# Patient Record
Sex: Male | Born: 1955 | Race: White | Hispanic: No | Marital: Married | Smoking: Current every day smoker
Health system: Southern US, Community
[De-identification: ages and names within clinical notes are randomized; demographics above are authoritative.]

## PROBLEM LIST (undated history)

## (undated) DIAGNOSIS — F1010 Alcohol abuse, uncomplicated: Secondary | ICD-10-CM

## (undated) DIAGNOSIS — D689 Coagulation defect, unspecified: Secondary | ICD-10-CM

## (undated) DIAGNOSIS — N189 Chronic kidney disease, unspecified: Secondary | ICD-10-CM

## (undated) DIAGNOSIS — M1990 Unspecified osteoarthritis, unspecified site: Secondary | ICD-10-CM

## (undated) DIAGNOSIS — Z87898 Personal history of other specified conditions: Secondary | ICD-10-CM

## (undated) DIAGNOSIS — I10 Essential (primary) hypertension: Secondary | ICD-10-CM

## (undated) DIAGNOSIS — R310 Gross hematuria: Secondary | ICD-10-CM

## (undated) DIAGNOSIS — C649 Malignant neoplasm of unspecified kidney, except renal pelvis: Secondary | ICD-10-CM

## (undated) DIAGNOSIS — N2889 Other specified disorders of kidney and ureter: Secondary | ICD-10-CM

## (undated) HISTORY — DX: Malignant neoplasm of unspecified kidney, except renal pelvis: C64.9

## (undated) HISTORY — DX: Essential (primary) hypertension: I10

## (undated) HISTORY — DX: Personal history of other specified conditions: Z87.898

## (undated) HISTORY — DX: Other specified disorders of kidney and ureter: N28.89

## (undated) HISTORY — DX: Chronic kidney disease, unspecified: N18.9

## (undated) HISTORY — PX: OTHER SURGICAL HISTORY: SHX169

## (undated) HISTORY — DX: Unspecified osteoarthritis, unspecified site: M19.90

## (undated) HISTORY — DX: Gross hematuria: R31.0

## (undated) HISTORY — DX: Alcohol abuse, uncomplicated: F10.10

## (undated) HISTORY — DX: Coagulation defect, unspecified: D68.9

---

## 2013-10-18 ENCOUNTER — Emergency Department: Payer: Self-pay | Admitting: Student

## 2013-10-18 LAB — CBC WITH DIFFERENTIAL/PLATELET
BASOS ABS: 0.1 10*3/uL (ref 0.0–0.1)
Basophil %: 1.1 %
Eosinophil #: 0.2 10*3/uL (ref 0.0–0.7)
Eosinophil %: 1.2 %
HCT: 46.3 % (ref 40.0–52.0)
HGB: 14.7 g/dL (ref 13.0–18.0)
LYMPHS PCT: 22.6 %
Lymphocyte #: 2.9 10*3/uL (ref 1.0–3.6)
MCH: 30 pg (ref 26.0–34.0)
MCHC: 31.8 g/dL — ABNORMAL LOW (ref 32.0–36.0)
MCV: 94 fL (ref 80–100)
MONO ABS: 1.3 x10 3/mm — AB (ref 0.2–1.0)
Monocyte %: 10.2 %
Neutrophil #: 8.4 10*3/uL — ABNORMAL HIGH (ref 1.4–6.5)
Neutrophil %: 64.9 %
PLATELETS: 250 10*3/uL (ref 150–440)
RBC: 4.9 10*6/uL (ref 4.40–5.90)
RDW: 15.1 % — AB (ref 11.5–14.5)
WBC: 12.9 10*3/uL — ABNORMAL HIGH (ref 3.8–10.6)

## 2013-10-18 LAB — TROPONIN I: Troponin-I: <0.02 ng/mL

## 2013-10-18 LAB — COMPREHENSIVE METABOLIC PANEL
ALT: 37 U/L
Albumin: 3.5 g/dL (ref 3.4–5.0)
Alkaline Phosphatase: 78 U/L
Anion Gap: 11 (ref 7–16)
BILIRUBIN TOTAL: 0.3 mg/dL (ref 0.2–1.0)
BUN: 15 mg/dL (ref 7–18)
CHLORIDE: 101 mmol/L (ref 98–107)
Calcium, Total: 8.6 mg/dL (ref 8.5–10.1)
Co2: 29 mmol/L (ref 21–32)
Creatinine: 0.89 mg/dL (ref 0.60–1.30)
EGFR (African American): >60
EGFR (Non-African Amer.): >60
Glucose: 85 mg/dL (ref 65–99)
OSMOLALITY: 281 (ref 275–301)
Potassium: 2.8 mmol/L — ABNORMAL LOW (ref 3.5–5.1)
SGOT(AST): 27 U/L (ref 15–37)
Sodium: 141 mmol/L (ref 136–145)
Total Protein: 7 g/dL (ref 6.4–8.2)

## 2013-10-18 LAB — MAGNESIUM: MAGNESIUM: 1.9 mg/dL

## 2013-10-18 LAB — ETHANOL: Ethanol: 33 mg/dL (ref 0–80)

## 2013-10-19 ENCOUNTER — Emergency Department: Payer: Self-pay | Admitting: Emergency Medicine

## 2013-10-19 LAB — COMPREHENSIVE METABOLIC PANEL
ALBUMIN: 3.7 g/dL (ref 3.4–5.0)
ALT: 41 U/L
Alkaline Phosphatase: 84 U/L
Anion Gap: 6 — ABNORMAL LOW (ref 7–16)
BUN: 13 mg/dL (ref 7–18)
Bilirubin,Total: 0.5 mg/dL (ref 0.2–1.0)
CALCIUM: 8.8 mg/dL (ref 8.5–10.1)
CREATININE: 0.84 mg/dL (ref 0.60–1.30)
Chloride: 104 mmol/L (ref 98–107)
Co2: 32 mmol/L (ref 21–32)
EGFR (Non-African Amer.): >60
Glucose: 102 mg/dL — ABNORMAL HIGH (ref 65–99)
Osmolality: 283 (ref 275–301)
POTASSIUM: 3.6 mmol/L (ref 3.5–5.1)
SGOT(AST): 28 U/L (ref 15–37)
Sodium: 142 mmol/L (ref 136–145)
TOTAL PROTEIN: 7.8 g/dL (ref 6.4–8.2)

## 2013-10-19 LAB — URINALYSIS, COMPLETE
Bacteria: NONE SEEN
RBC,UR: 5890 /HPF (ref 0–5)
Specific Gravity: 1.011 (ref 1.003–1.030)
Squamous Epithelial: NONE SEEN
WBC UR: 27 /HPF (ref 0–5)

## 2013-10-19 LAB — CBC
HCT: 50.5 % (ref 40.0–52.0)
HGB: 16.8 g/dL (ref 13.0–18.0)
MCH: 30.8 pg (ref 26.0–34.0)
MCHC: 33.2 g/dL (ref 32.0–36.0)
MCV: 93 fL (ref 80–100)
PLATELETS: 240 10*3/uL (ref 150–440)
RBC: 5.44 10*6/uL (ref 4.40–5.90)
RDW: 14.8 % — ABNORMAL HIGH (ref 11.5–14.5)
WBC: 13.5 10*3/uL — ABNORMAL HIGH (ref 3.8–10.6)

## 2013-10-19 LAB — PROTIME-INR
INR: 0.9
Prothrombin Time: 12 secs (ref 11.5–14.7)

## 2013-10-21 LAB — URINE CULTURE

## 2013-10-30 ENCOUNTER — Ambulatory Visit: Payer: Self-pay | Admitting: Urology

## 2013-10-31 ENCOUNTER — Ambulatory Visit: Payer: Self-pay | Admitting: Urology

## 2013-11-03 DIAGNOSIS — C649 Malignant neoplasm of unspecified kidney, except renal pelvis: Secondary | ICD-10-CM

## 2013-11-03 HISTORY — DX: Malignant neoplasm of unspecified kidney, except renal pelvis: C64.9

## 2013-11-03 HISTORY — PX: NEPHRECTOMY RADICAL: SUR878

## 2013-11-03 LAB — HM HIV SCREENING LAB: HM HIV SCREENING: NEGATIVE

## 2013-11-03 LAB — HM HEPATITIS C SCREENING LAB: HM Hepatitis Screen: NEGATIVE

## 2013-11-07 ENCOUNTER — Ambulatory Visit: Payer: Self-pay | Admitting: Urology

## 2013-11-07 LAB — URINALYSIS, COMPLETE
BILIRUBIN, UR: NEGATIVE
Bacteria: NONE SEEN
GLUCOSE, UR: NEGATIVE mg/dL (ref 0–75)
Ketone: NEGATIVE
LEUKOCYTE ESTERASE: NEGATIVE
Nitrite: NEGATIVE
Ph: 7 (ref 4.5–8.0)
Protein: NEGATIVE
RBC,UR: 1 /HPF (ref 0–5)
SPECIFIC GRAVITY: 1.005 (ref 1.003–1.030)
SQUAMOUS EPITHELIAL: NONE SEEN
WBC UR: 5 /HPF (ref 0–5)

## 2013-11-07 LAB — PROTIME-INR
INR: 0.9
PROTHROMBIN TIME: 12.2 s (ref 11.5–14.7)

## 2013-11-08 LAB — URINE CULTURE

## 2013-11-11 ENCOUNTER — Inpatient Hospital Stay: Payer: Self-pay | Admitting: Urology

## 2013-11-11 LAB — CBC WITH DIFFERENTIAL/PLATELET
BASOS ABS: 0.1 10*3/uL (ref 0.0–0.1)
BASOS PCT: 0.4 %
EOS PCT: 0 %
Eosinophil #: 0 10*3/uL (ref 0.0–0.7)
HCT: 41.6 % (ref 40.0–52.0)
HGB: 13.8 g/dL (ref 13.0–18.0)
LYMPHS PCT: 3.6 %
Lymphocyte #: 0.8 10*3/uL — ABNORMAL LOW (ref 1.0–3.6)
MCH: 30.9 pg (ref 26.0–34.0)
MCHC: 33.2 g/dL (ref 32.0–36.0)
MCV: 93 fL (ref 80–100)
MONO ABS: 1 x10 3/mm (ref 0.2–1.0)
Monocyte %: 4.3 %
NEUTROS PCT: 91.7 %
Neutrophil #: 21.1 10*3/uL — ABNORMAL HIGH (ref 1.4–6.5)
Platelet: 216 10*3/uL (ref 150–440)
RBC: 4.47 10*6/uL (ref 4.40–5.90)
RDW: 14.4 % (ref 11.5–14.5)
WBC: 23 10*3/uL — ABNORMAL HIGH (ref 3.8–10.6)

## 2013-11-11 LAB — COMPREHENSIVE METABOLIC PANEL
ALBUMIN: 3.2 g/dL — AB (ref 3.4–5.0)
Alkaline Phosphatase: 71 U/L
Anion Gap: 9 (ref 7–16)
BUN: 13 mg/dL (ref 7–18)
Bilirubin,Total: 0.5 mg/dL (ref 0.2–1.0)
CALCIUM: 7.9 mg/dL — AB (ref 8.5–10.1)
CHLORIDE: 104 mmol/L (ref 98–107)
CO2: 27 mmol/L (ref 21–32)
CREATININE: 1.02 mg/dL (ref 0.60–1.30)
Glucose: 110 mg/dL — ABNORMAL HIGH (ref 65–99)
OSMOLALITY: 280 (ref 275–301)
POTASSIUM: 3.3 mmol/L — AB (ref 3.5–5.1)
SGOT(AST): 28 U/L (ref 15–37)
SGPT (ALT): 35 U/L
Sodium: 140 mmol/L (ref 136–145)
Total Protein: 6 g/dL — ABNORMAL LOW (ref 6.4–8.2)

## 2013-11-11 LAB — MAGNESIUM: MAGNESIUM: 1.4 mg/dL — AB

## 2013-11-12 LAB — CBC WITH DIFFERENTIAL/PLATELET
Basophil #: 0 10*3/uL (ref 0.0–0.1)
Basophil %: 0.2 %
Eosinophil #: 0 10*3/uL (ref 0.0–0.7)
Eosinophil %: 0.3 %
HCT: 39.4 % — ABNORMAL LOW (ref 40.0–52.0)
HGB: 12.9 g/dL — AB (ref 13.0–18.0)
LYMPHS PCT: 13.1 %
Lymphocyte #: 1.6 10*3/uL (ref 1.0–3.6)
MCH: 30.6 pg (ref 26.0–34.0)
MCHC: 32.8 g/dL (ref 32.0–36.0)
MCV: 94 fL (ref 80–100)
MONO ABS: 1.1 x10 3/mm — AB (ref 0.2–1.0)
MONOS PCT: 8.8 %
NEUTROS ABS: 9.6 10*3/uL — AB (ref 1.4–6.5)
Neutrophil %: 77.6 %
Platelet: 190 10*3/uL (ref 150–440)
RBC: 4.22 10*6/uL — AB (ref 4.40–5.90)
RDW: 14.6 % — AB (ref 11.5–14.5)
WBC: 12.4 10*3/uL — AB (ref 3.8–10.6)

## 2013-11-12 LAB — BASIC METABOLIC PANEL
ANION GAP: 7 (ref 7–16)
BUN: 14 mg/dL (ref 7–18)
CO2: 31 mmol/L (ref 21–32)
Calcium, Total: 7.8 mg/dL — ABNORMAL LOW (ref 8.5–10.1)
Chloride: 102 mmol/L (ref 98–107)
Creatinine: 1.29 mg/dL (ref 0.60–1.30)
EGFR (African American): >60
EGFR (Non-African Amer.): >60
Glucose: 86 mg/dL (ref 65–99)
Osmolality: 279 (ref 275–301)
Potassium: 3.4 mmol/L — ABNORMAL LOW (ref 3.5–5.1)
Sodium: 140 mmol/L (ref 136–145)

## 2013-11-12 LAB — POTASSIUM: POTASSIUM: 3.7 mmol/L (ref 3.5–5.1)

## 2013-11-12 LAB — MAGNESIUM
MAGNESIUM: 2.3 mg/dL
Magnesium: 1.6 mg/dL — ABNORMAL LOW

## 2013-11-13 LAB — CBC WITH DIFFERENTIAL/PLATELET
Basophil #: 0 10*3/uL (ref 0.0–0.1)
Basophil %: 0.1 %
Eosinophil #: 0 10*3/uL (ref 0.0–0.7)
Eosinophil %: 0.3 %
HCT: 40.5 % (ref 40.0–52.0)
HGB: 13.5 g/dL (ref 13.0–18.0)
LYMPHS ABS: 0.9 10*3/uL — AB (ref 1.0–3.6)
LYMPHS PCT: 8.5 %
MCH: 31.2 pg (ref 26.0–34.0)
MCHC: 33.4 g/dL (ref 32.0–36.0)
MCV: 93 fL (ref 80–100)
MONO ABS: 1 x10 3/mm (ref 0.2–1.0)
Monocyte %: 9.8 %
Neutrophil #: 8.4 10*3/uL — ABNORMAL HIGH (ref 1.4–6.5)
Neutrophil %: 81.3 %
PLATELETS: 185 10*3/uL (ref 150–440)
RBC: 4.33 10*6/uL — ABNORMAL LOW (ref 4.40–5.90)
RDW: 14.4 % (ref 11.5–14.5)
WBC: 10.4 10*3/uL (ref 3.8–10.6)

## 2013-11-13 LAB — BASIC METABOLIC PANEL
ANION GAP: 6 — AB (ref 7–16)
BUN: 9 mg/dL (ref 7–18)
CALCIUM: 7.9 mg/dL — AB (ref 8.5–10.1)
CREATININE: 0.99 mg/dL (ref 0.60–1.30)
Chloride: 104 mmol/L (ref 98–107)
Co2: 30 mmol/L (ref 21–32)
EGFR (Non-African Amer.): >60
GLUCOSE: 90 mg/dL (ref 65–99)
Osmolality: 278 (ref 275–301)
Potassium: 3.6 mmol/L (ref 3.5–5.1)
Sodium: 140 mmol/L (ref 136–145)

## 2014-04-26 NOTE — Consult Note (Signed)
Brief Consult Note: Diagnosis: acute hyeprcarbic respiratory failrue with respiratory acidosis, Tobacco absue, ETOH abuse.   Patient was seen by consultant.   Consult note dictated.   Recommend further assessment or treatment.   Orders entered.   Comments: 1. acute hypercarbic respiratory failure, likely due to underlying COPD, start BIPAP, duonebs, advair, tiotropium,. O2 prn, get chest xray, following 2. Tobacco abuse, d.w pr for 3 min, nicotine replacement will be ordered 3. ETOH abuse, will initiate CIWA scale when needed, get LFT''s 4.s/p nephrectomy, pain meds per urology, getting labs, following for postopeative anemia Thanks for consult, will follow.  Electronic Signatures: Theodoro Grist (MD)  (Signed [phone] 12:57)  Authored: Brief Consult Note   Last Updated: 09-Nov-15 12:57 by Theodoro Grist (MD)

## 2014-04-26 NOTE — Discharge Summary (Signed)
Dates of Admission and Diagnosis:  Date of Admission 11-Nov-2013   Date of Discharge 15-Nov-2013   Admitting Diagnosis Renal mass   Final Diagnosis Renal cell carcinoma, pT3a   Discharge Diagnosis 1 Cellulitis   2 Alcoholism    Chief Complaint/History of Present Illness See admission H&P   Allergies:  No Known Allergies:   Pathology:  09-Nov-15 00:00   Pathology Report CASE: [id number] PATIENT: Kenneth Maxwell Surgical Pathology Report      SPECIMEN SUBMITTED: A. Kidney, right, nephrectomy, with adrenal   CLINICAL HISTORY: None provided   PRE-OPERATIVE DIAGNOSIS: right renal mass   POST-OPERATIVE DIAGNOSIS: Same as pre-op/ right nephrectomy      DIAGNOSIS: A. RIGHT KIDNEY AND ADRENAL GLAND; RIGHT OPEN RADICAL NEPHRECTOMY: ???CLEAR CELL RENAL CELL CARCINOMA. ???FUHRMAN GRADE 2. ???ADRENAL NODULAR HYPERPLASIA. ???SEE SUMMARY BELOW.   KIDNEY: Nephrectomy, Partial or Radical Specimens InvolvedA: Kidney, right, nephrectomy, with adrenal  Kidney, Nephrectomy, Partial or Radical Cancer Case Summary Specimen Site: Kidney structure SPECIMEN Procedure:     Radical nephrectomy Specimen Laterality:     Right Tumor Focality:     Unifocal Macroscopic Extent of Tumor:  Tumor extension into renal sinus TUMOR Histologic Type:    Clear cell renal cell carcinoma Sarcomatoid Features:    Not identified Histologic Grade (Fuhrman NuclearGrade): G2: Nuclei slightly irregular, approximately 15 microns; nucleoli evident EXTENT Tumor Size:    Greatest dimension (cm) 4.3cm Microscopic Tumor Extension:  Tumor extension into renal sinus MARGINS Margin Status: Margins uninvolved by invasive carcinoma ACCESSORY FINDINGS Lymph-Vascular Invasion: Not identified STAGE  (pTNM) TNM Descriptors:    Not applicable Primary Tumor (pT): pT3a: Tumor grossly extends into the renal vein or its segmental (muscle containing) branches, ortumor invades perirenal and / or renal  sinus fat but not beyond Gerota's fascia Regional Lymph Nodes (pN) pNX: Regional lymph nodes cannot be assessed No nodes submitted or found Distant Metastasis (pM): Not applicable Not applicable ADDITIONAL NON-TUMOR Pathologic Findings in Nonneoplastic Kidney: Other (specify) PAS-D stain pending on non-neoplastic kidney to evaluate for glomerulosclerosis.  Note Intradepartmental consultation was obtained.    GROSS DESCRIPTION:   A.Labeled: Right kidney with adrenal  Type of specimen: Nephrectomy  Laterality of specimen: Right kidney and adrenal  Size and weight of specimen: 575 grams 15.7 x 12.2 x 5.7 cm  Orientation: External surface which includes Gerota's fascia inked blue  Presence/absence of adrenal gland: Present  Tumor site: anatomic site: Mid to upper pole adjacent to renal sinus  Tumor size: 4.3 x 4.0 x 3.6 cm  Tumor Description: Variegated red tan-yellow focally cystic  Presence/absence of multicentricity: Absent  Confinement/non-confinement to kidney: Bulging the capsule and abutting Gerota's fascia however appears contained  Extent of invasion: Perirenal adipose tissue: Grossly uninvolved Gerota's fascia: Grossly abuts Renal vein: Does notgrossly invade, 2.5 cm from margin Ureter: Does not grossly involve, 8.0 cm from ureter margin Renal Sinus: Grossly involves renal sinus      Pelvicalyceal: Does not grossly involve Adrenal: Not continuous with however the adrenal 4.6 x 2.2 x 2.8cm and has a more central defined nodule 2.0 x 2.0 x 1.5 centimeter color is grossly consistent with adjacent adrenal tissue  Other organs: None noted  Surgical margins:      Peri-renal adipose tissue: Negative Renal vein: Negative Renal artery: Negative Ureter: Negative  Description of kidney away from tumor: Cortex 1.0 cm on medulla 1.6 cm  Hilar lymph nodes: None grossly identified  Other significant findings: None noted  Block Summary: 1???en face vein and  artery  and ureter margin 2???3???Gerota's fascia and tumor 4???6???tumor with renal sinus 7???tumor in area of hilum with adjacent fatty tissue 8???tumor and adjacent uninvolved kidney 9???kidney from lower pole 10???11-representative adrenal with nodule  Final Diagnosis performed by Delorse Lek, MD.  Electronically signed 11/14/2013 2:45:32PM    The electronic signature indicates that the named Attending Pathologist has evaluated the specimen  Technical component performed at [facility], [address], [place], [place] [address] Lab: [phone] Dir: Darrick Penna. Evette Doffing, MD  Professional component performed at [facility], [facility], [place], [place], [place] [address] Lab: [phone] Dir: Dellia Nims. Rubinas, MD   Routine Chem:  11-Nov-15 04:22   Glucose, Serum 90  BUN 9  Creatinine (comp) 0.99  Sodium, Serum 140  Potassium, Serum 3.6  Chloride, Serum 104  CO2, Serum 30  Calcium (Total), Serum  7.9  Anion Gap  6  Osmolality (calc) 278  eGFR (African American) >60  eGFR (Non-African American) >60 (eGFR values <41m/min/1.73 m2 may be an indication of chronic kidney disease (CKD). Calculated eGFR, using the MRDR Study equation, is useful in  patients with stable renal function. The eGFR calculation will not be reliable in acutely ill patients when serum creatinine is changing rapidly. It is not useful in patients on dialysis. The eGFR calculation may not be applicable to patients at the low and high extremes of body sizes, pregnant women, and vegetarians.)  Routine Hem:  11-Nov-15 04:22   WBC (CBC) 10.4  RBC (CBC)  4.33  Hemoglobin (CBC) 13.5  Hematocrit (CBC) 40.5  Platelet Count (CBC) 185  MCV 93  MCH 31.2  MCHC 33.4  RDW 14.4  Neutrophil % 81.3  Lymphocyte % 8.5  Monocyte % 9.8  Eosinophil % 0.3  Basophil % 0.1  Neutrophil #  8.4  Lymphocyte #  0.9  Monocyte # 1.0  Eosinophil # 0.0  Basophil # 0.0 (Result(s) reported on 13 Nov 2013  at 04:45AM.)   Pertinent Past History:  Pertinent Past History See admission H&P   Hospital Course:  Hospital Course Patient was admitted from surgery to ICU for overnight monitoring given concern for ventilation/ oxygenation issues immediately post op.  He improved and was able to be weaned from oxygen overnight with pulmonary toilet. He remained on CIWA protocol throughout his hospital course with no signs of EtOH withdrawal.  His labs and vitals remained stable.  He was transfered to the floor on POD1.  His diet was advanced as tolerated and began passing flatus on POD3.  He was transitioned to PO pain medications.  He was treated for an evolving mild right cellulitis overlying the right hip but not involving the wound.  He was discharged home in stable condition on POD 4, tolerating PO, pain well controlled, ambulating.   Condition on Discharge Good   Code Status:  Code Status Full Code   DISCHARGE INSTRUCTIONS HOME MEDS:  Medication Reconciliation: Patient's Home Medications at Discharge:     Medication Instructions  acetaminophen-oxycodone 325 mg-5 mg oral tablet  1 tab(s) orally every 4 hours, As Needed, moderate pain (4-6/10) - for Pain , As needed, moderate pain (4-6/10)   docusate sodium 100 mg oral capsule  1 cap(s) orally 2 times a day   cephalexin 500 mg oral capsule  1 cap(s) orally every 8 hours    PRESCRIPTIONS: PRINTED AND PLACED ON CHART   Physician's Instructions:  Home Health? No   Treatments None   Dressing Care Staples are in place to close your incision.  Keep this area clean and dry.  Keep the bandage in place unless it becomes loose or soiled.  You may shower   Home Oxygen? No   Diet Regular   Dietary Supplements None   Diet Consistency Regular Consistency   Activity Limitations No exertional activity  No heavy lifting  4-6 weeks post op   Referrals None   Return to Work after follow up visit with MD   Time frame for Follow Up Appointment 1-2  weeks  stable removal     Hollice Espy J(Attending Physician): [facility], [address], [place], [place], [place] [address], [place] [phone]  TIME SPENT:  Total Time: 30 minutes or less   Electronic Signatures: Sherlynn Stalls (MD)  (Signed [phone] 07:35)  Authored: ADMISSION DATE AND DIAGNOSIS, CHIEF COMPLAINT/HPI, Allergies, PERTINENT LABS, PERTINENT PAST HISTORY, HOSPITAL COURSE, DISCHARGE INSTRUCTIONS HOME MEDS, PATIENT INSTRUCTIONS, Follow Up Physician, TIME SPENT   Last Updated: 13-Nov-15 07:35 by Sherlynn Stalls (MD)

## 2014-04-26 NOTE — Consult Note (Signed)
PATIENT NAME:  Kenneth Maxwell, TETRAULT MR#:  [id number] DATE OF BIRTH:  05/27/1955  DATE OF CONSULTATION:  11/11/2013  REFERRING PHYSICIAN:   CONSULTING PHYSICIAN:  Theodoro Grist, MD  PRIMARY CARE PHYSICIAN: Lelon Huh, MD  HISTORY OF PRESENT ILLNESS: The patient is a 59 year old Caucasian male with past medical history significant for history of hypertension who presents for nephrectomy as well as right adrenalectomy on the 9th of November 2015. He underwent right open radical nephrectomy, nonadrenal sparing,  and lost approximately 150 mL of blood during operation. Operation was performed under general epidural anesthesia. The patient is receiving epidural fentanyl now; however, complains of significant pain in the right flank area were operation was performed. Postoperatively, he was somewhat short of breath. He did not take deep breaths overall. He was placed on oxygen and his ABGs were performed since he looked dusky. His ABGs revealed hypoxia as well as hypercarbia and acidosis. Hospitalist services were contacted for consultation. The patient admits smoking approximately 2 packs a day for at least 40 years or more. He also admits of drinking plenty of alcohol.  PAST MEDICAL HISTORY: Significant for history of tobacco and alcohol abuse, history of hypertension.   MEDICATIONS: None.   PAST SURGICAL HISTORY: Open radical nephrectomy as well as adrenalectomy today, on the 9th of November 2015, by Dr. Erlene Quan.   ALLERGIES: None.   FAMILY HISTORY: Negative for early coronary artery disease. The patient's mother had kidney problems. The patient's father had heart problems, according to the patient.  SOCIAL HISTORY: The patient is married, has 3 children. Smokes approximately 2 packs a day for more than 40 years. Drinks at least 10 to 15 beers a day. He works as a Building control surveyor.   REVIEW OF SYSTEMS: Difficult to obtain as the patient is poorly cooperative. Admits of having significant pain in the left flank.  Denies any chest pains. Denies any significant shortness of breath. Denies any cough production. Denies nausea, vomiting, or abdominal pains, except as mentioned above.  PHYSICAL EXAMINATION: VITAL SIGNS: During my evaluation, the patient's temperature was unknown, the patient's pulse is 64, respiration rate was 12 to 14, blood pressure 130/60's, and O2 sats were 97% on oxygen therapy.  GENERAL: This is a well-developed, well-nourished pale gentleman lying on the stretcher.  HEENT: His pupils are equal, reactive to light. Extraocular movements intact. No icterus or conjunctivitis. Has normal hearing. No pharyngeal erythema. Mucosa is very dry.  NECK: No masses. Supple and nontender. Thyroid is not enlarged. No adenopathy. No JVD. No carotid bruits bilaterally. Full range of motion.  LUNGS: Somewhat diminished breath sounds but otherwise a few rhonchi were heard. No rales. No wheezing. The patient has labored inspirations whenever he speaks as well as increased effort, but otherwise he is not in overt respiratory distress. No dullness to percussion.  HEART: S1 and S2 appreciated. Rhythm was regular. PMI not lateralized. Chest is nontender to palpation. 1+ pedal pulses.  EXTREMITIES: No lower extremity edema, calf tenderness or cyanosis was noted.  ABDOMEN: Soft, tender diffusely but mostly in the right side, right flank area, where a right-sided dressing is placed. No hepatosplenomegaly or masses were noted. Bowel sounds are diminished.  MUSCLE STRENGTH: Able to move all extremities. No cyanosis, degenerative joint disease or kyphosis. Gait was not tested. SKIN: Did not reveal any rashes, lesions, erythema, nodularity or induration. It was warm and dry to palpation.  LYMPHATIC: No adenopathy in the cervical region.  NEUROLOGIC: Cranial nerves grossly intact. Sensory is intact. No dysarthria  or aphasia. The patient is alert and oriented to time, person, and place. Poorly cooperative. Memory is somewhat  impaired.  PSYCHIATRIC: No significant confusion, agitation, or depression was noted.   DIAGNOSTIC DATA: On 5th of November 2015, antibody screen was negative. Pro time was 12.2. INR was 0.9. Urine culture was negative. Urinalysis was unremarkable, except for 1+ blood, 1 red blood cell, and 5 white blood cells.  ABGs were performed today, on the 9th of November 2015, and showed pH of 7.29, pCO2 52, and saturation was 93.9% on 35% FiO2 via mask.  Chest x-ray is not performed yet.   ASSESSMENT AND PLAN: 1.  Acute hypercarbic respiratory failure with acidosis, likely due to underlying chronic obstructive pulmonary disease. We will start the patient on BiPAP, DuoNebs, Advair, as well as tiotropium and oxygen as needed, getting chest x-ray.  2.  Tobacco abuse. We discussed cessation with the patient for approximately 3 to 4 minutes. Nicotine replacement therapy will be ordered.  3.  Alcohol abuse. Will initiate CIWA scale when the patient needs one. We will get LFT labs done.  4.  Status post nephrectomy and adrenalectomy. We will watch the patient's vital signs and we will continue pain medications per urology. We will follow the patient's postoperative anemia.  Thanks for the consult. We will follow the patient along while he is in the hospital.  TIME SPENT: 50 minutes.  ____________________________ Theodoro Grist, MD rv:sb D: 11/11/2013 13:07:26 ET T: 11/11/2013 13:38:17 ET JOB#: [id number]  cc: Theodoro Grist, MD, <Dictator> Harvie Morua MD ELECTRONICALLY SIGNED 12/01/2013 20:12

## 2014-04-26 NOTE — Op Note (Signed)
PATIENT NAME:  Kenneth Maxwell, Kenneth Maxwell MR#:  [id number] DATE OF BIRTH:  1955/01/10  DATE OF PROCEDURE:  11/11/2013  PREOPERATIVE DIAGNOSES: Right renal mass, right adrenal nodule.  POSTOPERATIVE DIAGNOSES: Right renal mass, right adrenal nodule.    PROCEDURE PERFORMED: Right open radical nephrectomy (nonadrenal sparing).   ANESTHESIA: General anesthesia with epidural catheter.   ATTENDING SURGEON: Sherlynn Stalls, MD   ASSISTANT: Lillia Pauls. Manny, MD  SPECIMENS: Right kidney with right adrenal gland.   COMPLICATIONS: None.   DRAINS: Epidural and a 16 French Foley catheter.   INDICATION: This is a 59 year old male with episodes of gross hematuria, found to have a large approximately 5.5 cm right upper pole renal mass with questionable invasion of the liver, with the absence of a fat plane between the two. He also has 2 cm right adrenal nodule on the same side. There is no evidence of lymphadenopathy or renal vein involvement. He was counseled to undergo right open radical nephrectomy, given the location of the mass and questionable involvement of the liver. Risks and benefits of the procedure were explained in detail with the patient, who agreed to proceed as planned.   PROCEDURE IN DETAIL: The patient was correctly identified in the preoperative holding area and informed consent was confirmed. He was brought to the operating suite and placed on the table in a supine position. At this time, a universal timeout protocol was performed. All team members were identified and Venodyne boots were placed. He was administered 2 g of IV Ancef in the perioperative period. Prior to administration of general anesthesia, he was placed in a seated position. An epidural catheter was placed for postoperative pain control. He was then repositioned in the supine position and placed under general anesthesia. The abdomen was then shaved by the attending surgeon. The patient was placed in a modified flank position with the  right side up. Rolls were placed under his back, and the arm was placed carefully across his body in an airplane position. His left leg was bent and the right leg was kept straight, and pillows were placed between his legs. The table was then slightly flexed, and he was secured to the table using gel pads, towels and tape. The test roll indicated that he was adequately secured, and all pressure points were carefully padded. He was then prepped and draped in a standard surgical fashion.   An approximately 25 cm long incision was made approximately 2 cm below the right costal margin, extending to the junction of the eleventh and twelfth ribs. Bovie electrocautery was then used to split each layer of muscle fibers. Approximately half of the Exparel solution was used in the skin incision prior to the incision. The dissection was carried down until the peritoneum and the right retroperitoneum were exposed. At this point in time, the white line of Toldt was identified and the colon was mobilized medially. The Bookwalter retractor was then brought in and using moist towels the bowels and body walls were carefully retracted. The duodenum and vena cava were very quickly identified, and the duodenum was kocherized medially to avoid any injury to this structure.   The right ureter was then identified and traced superiorly, towards the hilum, carrying this dissection caudally to cranial direction. At this point in time, the renal vein was identified and carefully dissected free both superiorly and inferiorly. Once this was achieved, a right angle could be passed posterior to this, and a vessel loop was placed around the renal vein. The  artery and a small venous branch were then isolated using a vessel loop. Next, these structures were ligated first by placing a metal clip medially on the artery and two 0 silk ties medially and 1 laterally in order to create space to ligate the vein. The artery was transected and there was  no active bleeding noted. The vein was then ligated using a similar technique with a metal clip medially and two 0 silk ties medially and 1 laterally. This was also then transected, and a small amount of backbleeding was noted from the kidney side of the renal vein. This was oversewn using a 2-0 Vicryl, at which time hemostasis was achieved. The ureter was then transected, placing 2 metal clips distally and 1 proximally, and the ureter was incise between. The kidney was then freed off posteriorly and laterally, both with blunt dissection as well as sharply using a harmonic to ligate any perforating vessels. Care was then taken to incise the peritoneum superiorly, and the upper pole of the kidney was easily able to be mobilized away from the liver. There appeared to be absolutely no direct extension of the mass into the liver. At this point, the kidney hung only by the adrenal gland. This was carefully dissected free from the IVC using the harmonic. The adrenal gland was left intact with the specimen, and once this was entirely freed, the kidney was then able to be passed off the field for permanent specimen in formalin.   The bed was then carefully inspected and there was no evidence of active bleeding. It was then irrigated using warm water. The hilum was then reinspected. There was no bleeding noted from this area. A layer of Surgicel was placed along the superior fossae, extending superiorly and medially, where the adrenal gland had been dissected free. Hemostasis was deemed adequate at this time. All retractors were then removed and the patient was taken out of the slight flexed position. The incision was then closed in 2 layers, using first 0 Vicryl in a running layer to close the first 2 muscle layers with fascia, followed by a #1 looped PDS in a running fashion to close the external oblique fascia. The remaining Exparel was then placed within this fascia as well as the subcutaneous tissues. Staples were  used to close the skin. The patient was then cleaned and dried, and an [place] dressing was applied to the wound.   The patient was reversed from anesthesia, extubated without complication, and taken to the PACU in stable condition. There were no complications in this case.    ____________________________ Sherlynn Stalls, MD ajb:MT D: 11/11/2013 15:24:38 ET T: 11/11/2013 16:21:53 ET JOB#: [id number]  cc: Sherlynn Stalls, MD, <Dictator> Sherlynn Stalls MD ELECTRONICALLY SIGNED 11/21/2013 15:21

## 2014-04-28 LAB — SURGICAL PATHOLOGY

## 2014-06-23 ENCOUNTER — Other Ambulatory Visit: Payer: Self-pay | Admitting: Family Medicine

## 2014-06-23 DIAGNOSIS — C649 Malignant neoplasm of unspecified kidney, except renal pelvis: Secondary | ICD-10-CM

## 2014-06-25 ENCOUNTER — Encounter: Payer: Self-pay | Admitting: Family Medicine

## 2014-06-25 ENCOUNTER — Ambulatory Visit (INDEPENDENT_AMBULATORY_CARE_PROVIDER_SITE_OTHER): Payer: 59 | Admitting: Family Medicine

## 2014-06-25 VITALS — BP 142/87 | HR 81 | Temp 98.2°F | Resp 16 | Ht 70.0 in | Wt 178.8 lb

## 2014-06-25 DIAGNOSIS — Z720 Tobacco use: Secondary | ICD-10-CM

## 2014-06-25 DIAGNOSIS — I10 Essential (primary) hypertension: Secondary | ICD-10-CM | POA: Diagnosis not present

## 2014-06-25 DIAGNOSIS — F1010 Alcohol abuse, uncomplicated: Secondary | ICD-10-CM | POA: Insufficient documentation

## 2014-06-25 MED ORDER — LOSARTAN POTASSIUM 100 MG PO TABS: 100.0000 mg | ORAL_TABLET | Freq: Every day | ORAL | Status: DC

## 2014-06-25 MED ORDER — AMLODIPINE BESYLATE 5 MG PO TABS: 5.0000 mg | ORAL_TABLET | Freq: Every day | ORAL | Status: DC

## 2014-06-25 NOTE — Assessment & Plan Note (Addendum)
Discussed safe alcohol use (2 beers per day) and negative health effects. Particularly as related to blood pressure and kidney health.   Encouraged pt to reduce alcohol intake as much as possible. Discussed local resources.

## 2014-06-25 NOTE — Progress Notes (Signed)
Subjective:    Patient ID: Kenneth Maxwell, male    DOB: [birth year], 57 y.o.   MRN: [id number]  HPI: Kenneth Maxwell is a 59 y.o. male presenting on 06/25/2014 for Follow-up   Hypertension This is a chronic problem. The current episode started more than 1 year ago. The problem has been gradually improving since onset. The problem is uncontrolled. Pertinent negatives include no blurred vision, chest pain, headaches, peripheral edema or shortness of breath. Risk factors for coronary artery disease include male gender and smoking/tobacco exposure. Past treatments include angiotensin blockers and calcium channel blockers. The current treatment provides moderate improvement. Hypertensive end-organ damage includes kidney disease.   Pt also reports drinking at least 7 beers per day. And smoking 1.5 packs of cigarettes per day.   Past Medical History  Diagnosis Date  . Arthritis   . Blood clotting disorder   . Hypertension   . Cancer 11/2013    kidney cancer s/p R nephrectomy    No current outpatient prescriptions on file prior to visit.   No current facility-administered medications on file prior to visit.    Review of Systems  Constitutional: Negative for fever and chills.  Eyes: Negative for blurred vision.  Respiratory: Negative for chest tightness, shortness of breath and wheezing.   Cardiovascular: Negative for chest pain.  Gastrointestinal: Negative.   Endocrine: Negative for cold intolerance, heat intolerance, polydipsia, polyphagia and polyuria.  Neurological: Negative for light-headedness, numbness and headaches.  Psychiatric/Behavioral: Negative.    Per HPI unless specifically indicated above     Objective:    BP 142/87 mmHg  Pulse 81  Temp(Src) 98.2 F (36.8 C) (Oral)  Resp 16  Ht 5' 10"  (1.778 m)  Wt 178 lb 12.8 oz (81.103 kg)  BMI 25.66 kg/m2  Wt Readings from Last 3 Encounters:  06/25/14 178 lb 12.8 oz (81.103 kg)    Physical Exam Results for orders  placed or performed in visit on 11/11/13  Comprehensive metabolic panel  Result Value Ref Range   Glucose 110 (H) 65-99 mg/dL   BUN 13 7-18 mg/dL   Creatinine 1.02 0.60-1.30 mg/dL   Sodium 140 136-145 mmol/L   Potassium 3.3 (L) 3.5-5.1 mmol/L   Chloride 104 98-107 mmol/L   Co2 27 21-32 mmol/L   Calcium, Total 7.9 (L) 8.5-10.1 mg/dL   SGOT(AST) 28 15-37 Unit/L   SGPT (ALT) 35 U/L   Alkaline Phosphatase 71 Unit/L   Albumin 3.2 (L) 3.4-5.0 g/dL   Total Protein 6.0 (L) 6.4-8.2 g/dL   Bilirubin,Total 0.5 0.2-1.0 mg/dL   Osmolality 280 275-301   Anion Gap 9 7-16  CBC with Differential/Platelet  Result Value Ref Range   WBC 23.0 (H) 3.8-10.6 x10 3/mm 3   RBC 4.47 4.40-5.90 x10 6/mm 3   HGB 13.8 13.0-18.0 g/dL   HCT 41.6 40.0-52.0 %   MCV 93 80-100 fL   MCH 30.9 26.0-34.0 pg   MCHC 33.2 32.0-36.0 g/dL   RDW 14.4 11.5-14.5 %   Platelet 216 150-440 x10 3/mm 3   Neutrophil % 91.7 %   Lymphocyte % 3.6 %   Monocyte % 4.3 %   Eosinophil % 0.0 %   Basophil % 0.4 %   Neutrophil # 21.1 (H) 1.4-6.5 x10 3/mm 3   Lymphocyte # 0.8 (L) 1.0-3.6 x10 3/mm 3   Monocyte # 1.0 0.2-1.0 x10 3/mm    Eosinophil # 0.0 0.0-0.7 x10 3/mm 3   Basophil # 0.1 0.0-0.1 x10 3/mm 3  Magnesium  Result Value  Ref Range   Magnesium 1.4 (L) mg/dL  CBC with Differential/Platelet  Result Value Ref Range   WBC 12.4 (H) 3.8-10.6 x10 3/mm 3   RBC 4.22 (L) 4.40-5.90 x10 6/mm 3   HGB 12.9 (L) 13.0-18.0 g/dL   HCT 39.4 (L) 40.0-52.0 %   MCV 94 80-100 fL   MCH 30.6 26.0-34.0 pg   MCHC 32.8 32.0-36.0 g/dL   RDW 14.6 (H) 11.5-14.5 %   Platelet 190 150-440 x10 3/mm 3   Neutrophil % 77.6 %   Lymphocyte % 13.1 %   Monocyte % 8.8 %   Eosinophil % 0.3 %   Basophil % 0.2 %   Neutrophil # 9.6 (H) 1.4-6.5 x10 3/mm 3   Lymphocyte # 1.6 1.0-3.6 x10 3/mm 3   Monocyte # 1.1 (H) 0.2-1.0 x10 3/mm    Eosinophil # 0.0 0.0-0.7 x10 3/mm 3   Basophil # 0.0 0.0-0.1 x10 3/mm 3  Basic metabolic panel  Result Value Ref Range    Glucose 86 65-99 mg/dL   BUN 14 7-18 mg/dL   Creatinine 1.29 0.60-1.30 mg/dL   Sodium 140 136-145 mmol/L   Potassium 3.4 (L) 3.5-5.1 mmol/L   Chloride 102 98-107 mmol/L   Co2 31 21-32 mmol/L   Calcium, Total 7.8 (L) 8.5-10.1 mg/dL   Osmolality 279 275-301   Anion Gap 7 7-16   EGFR (African American) >60 >72m/min   EGFR (Non-African Amer.) >60 >69mmin  Magnesium  Result Value Ref Range   Magnesium 1.6 (L) mg/dL  Potassium  Result Value Ref Range   Potassium 3.7 3.5-5.1 mmol/L  Magnesium  Result Value Ref Range   Magnesium 2.3 mg/dL  CBC with Differential/Platelet  Result Value Ref Range   WBC 10.4 3.8-10.6 x10 3/mm 3   RBC 4.33 (L) 4.40-5.90 x10 6/mm 3   HGB 13.5 13.0-18.0 g/dL   HCT 40.5 40.0-52.0 %   MCV 93 80-100 fL   MCH 31.2 26.0-34.0 pg   MCHC 33.4 32.0-36.0 g/dL   RDW 14.4 11.5-14.5 %   Platelet 185 150-440 x10 3/mm 3   Neutrophil % 81.3 %   Lymphocyte % 8.5 %   Monocyte % 9.8 %   Eosinophil % 0.3 %   Basophil % 0.1 %   Neutrophil # 8.4 (H) 1.4-6.5 x10 3/mm 3   Lymphocyte # 0.9 (L) 1.0-3.6 x10 3/mm 3   Monocyte # 1.0 0.2-1.0 x10 3/mm    Eosinophil # 0.0 0.0-0.7 x10 3/mm 3   Basophil # 0.0 0.0-0.1 x10 3/mm 3  Basic metabolic panel  Result Value Ref Range   Glucose 90 65-99 mg/dL   BUN 9 7-18 mg/dL   Creatinine 0.99 0.60-1.30 mg/dL   Sodium 140 136-145 mmol/L   Potassium 3.6 3.5-5.1 mmol/L   Chloride 104 98-107 mmol/L   Co2 30 21-32 mmol/L   Calcium, Total 7.9 (L) 8.5-10.1 mg/dL   Osmolality 278 275-301   Anion Gap 6 (L) 7-16   EGFR (African American) >60 >6058min   EGFR (Non-African Amer.) >60 >51m45mn      Assessment & Plan:   Problem List Items Addressed This Visit      Cardiovascular and Mediastinum   Hypertension - Primary    Increase Losartan today. Pt encouraged to continue to check BP at home. DASH diet reviewed. Encouraged maintaining current level of physical activity to help control BP.       Relevant Medications   losartan  (COZAAR) 100 MG tablet   amLODipine (NORVASC) 5 MG tablet  Other   Tobacco use    Pt is currently not willing to quit. Is aware of the affect of smoking on blood pressure.       Alcohol abuse    Discussed safe alcohol use (2 beers per day) and negative health effects. Particularly as related to blood pressure and kidney health.   Encouraged pt to reduce alcohol intake as much as possible. Discussed local resources.          Meds ordered this encounter  Medications  . DISCONTD: amLODipine (NORVASC) 2.5 MG tablet    Sig:   . DISCONTD: losartan (COZAAR) 50 MG tablet    Sig:   . losartan (COZAAR) 100 MG tablet    Sig: Take 1 tablet (100 mg total) by mouth daily.    Dispense:  30 tablet    Refill:  11    Order Specific Question:  Supervising Provider    Answer:  Arlis Porta [phone]  . amLODipine (NORVASC) 5 MG tablet    Sig: Take 1 tablet (5 mg total) by mouth daily.    Dispense:  30 tablet    Refill:  11      Follow up plan: Return in about 4 weeks (around 07/23/2014) for HTN with Dr. Luan Pulling. Marland Kitchen

## 2014-06-25 NOTE — Assessment & Plan Note (Signed)
Increase Losartan today. Pt encouraged to continue to check BP at home. DASH diet reviewed. Encouraged maintaining current level of physical activity to help control BP.

## 2014-06-25 NOTE — Patient Instructions (Addendum)
Your goal blood pressure is 140/90.  Please continue to check your BP at home. Please call the office if consistently elevated > 150/100.   Work on low salt/sodium diet - goal <1.5gm (1,500mg ) per day. Eat a diet high in fruits/vegetables and whole grains.  Look into mediterranean and DASH diet. Goal activity is 188min/wk of moderate intensity exercise.  This can be split into 30 minute chunks.  If you are not at this level, you can start with smaller 10-15 min increments and slowly build up activity. Look at [place].org for more resources  Consider reducing alcohol intake to help preserve your health. Smoking cessation can also help control your blood pressure. The [place] Quitline is a great resource for smoking cessation.   Please seek immediate medical attention at ER or Urgent Care if you develop: Chest pain, pressure or tightness. Shortness of breath accompanied by nausea or diaphoresis Visual changes Numbness or tingling on one side of the body Facial droop Altered mental status Or any concerning symptoms.

## 2014-06-25 NOTE — Assessment & Plan Note (Signed)
Pt is currently not willing to quit. Is aware of the affect of smoking on blood pressure.

## 2014-06-30 ENCOUNTER — Ambulatory Visit
Admission: RE | Admit: 2014-06-30 | Discharge: 2014-06-30 | Disposition: A | Discharge status: Home / Self Care | Payer: 59 | Source: Ambulatory Visit | Attending: Urology | Admitting: Urology

## 2014-06-30 DIAGNOSIS — C649 Malignant neoplasm of unspecified kidney, except renal pelvis: Secondary | ICD-10-CM

## 2014-06-30 DIAGNOSIS — N2889 Other specified disorders of kidney and ureter: Secondary | ICD-10-CM | POA: Insufficient documentation

## 2014-06-30 MED ORDER — IOHEXOL 350 MG/ML SOLN
100.0000 mL | Freq: Once | INTRAVENOUS | Status: AC | PRN
  Administered 2014-06-30: 100 mL via INTRAVENOUS

## 2014-08-04 ENCOUNTER — Encounter: Payer: Self-pay | Admitting: Family Medicine

## 2014-08-04 ENCOUNTER — Ambulatory Visit (INDEPENDENT_AMBULATORY_CARE_PROVIDER_SITE_OTHER): Payer: 59 | Admitting: Family Medicine

## 2014-08-04 VITALS — BP 155/80 | HR 82 | Temp 98.0°F | Resp 16 | Ht 70.0 in | Wt 177.8 lb

## 2014-08-04 DIAGNOSIS — F1010 Alcohol abuse, uncomplicated: Secondary | ICD-10-CM

## 2014-08-04 DIAGNOSIS — C641 Malignant neoplasm of right kidney, except renal pelvis: Secondary | ICD-10-CM

## 2014-08-04 DIAGNOSIS — Z8679 Personal history of other diseases of the circulatory system: Secondary | ICD-10-CM | POA: Insufficient documentation

## 2014-08-04 DIAGNOSIS — I10 Essential (primary) hypertension: Secondary | ICD-10-CM | POA: Diagnosis not present

## 2014-08-04 DIAGNOSIS — F17200 Nicotine dependence, unspecified, uncomplicated: Secondary | ICD-10-CM | POA: Insufficient documentation

## 2014-08-04 DIAGNOSIS — Z720 Tobacco use: Secondary | ICD-10-CM | POA: Diagnosis not present

## 2014-08-04 DIAGNOSIS — Z85528 Personal history of other malignant neoplasm of kidney: Secondary | ICD-10-CM | POA: Insufficient documentation

## 2014-08-04 DIAGNOSIS — M1990 Unspecified osteoarthritis, unspecified site: Secondary | ICD-10-CM | POA: Insufficient documentation

## 2014-08-04 MED ORDER — LOSARTAN POTASSIUM 100 MG PO TABS: 100.0000 mg | ORAL_TABLET | Freq: Every day | ORAL | Status: DC

## 2014-08-04 MED ORDER — AMLODIPINE BESYLATE 10 MG PO TABS: 10.0000 mg | ORAL_TABLET | Freq: Every day | ORAL | Status: DC

## 2014-08-04 NOTE — Progress Notes (Signed)
Name: Kenneth Maxwell   MRN: [id number]    DOB: [birth year]   Date:08/04/2014       Progress Note  Subjective  Chief Complaint  Chief Complaint  Patient presents with  . Hypertension    HPI  Here for f/u of HBP. Taking meds.  No c/o.  Still smoking ands drinking to excess.  Past Medical History  Diagnosis Date  . Arthritis   . Blood clotting disorder   . Hypertension   . Cancer 11/2013    kidney cancer s/p R nephrectomy    Past Surgical History  Procedure Laterality Date  . R kidney       R side kidney cancer surgery    Family History  Problem Relation Age of Onset  . Hypertension Mother   . Hypertension Father   . Heart attack Father     History   Social History  . Marital Status: Married    Spouse Name: N/A  . Number of Children: N/A  . Years of Education: N/A   Occupational History  . Not on file.   Social History Main Topics  . Smoking status: Current Every Day Smoker -- 1.50 packs/day    Types: Cigarettes  . Smokeless tobacco: Not on file  . Alcohol Use: 29.4 oz/week    49 Cans of beer per week  . Drug Use: No  . Sexual Activity: Not on file   Other Topics Concern  . Not on file   Social History Narrative     Current outpatient prescriptions:  .  amLODipine (NORVASC) 5 MG tablet, Take 1 tablet (5 mg total) by mouth daily., Disp: 30 tablet, Rfl: 11 .  losartan (COZAAR) 100 MG tablet, Take 1 tablet (100 mg total) by mouth daily., Disp: 30 tablet, Rfl: 11  No Known Allergies   Review of Systems  Constitutional: Negative for fever, chills, weight loss and malaise/fatigue.  HENT: Negative for congestion and tinnitus.   Eyes: Negative for blurred vision and double vision.  Respiratory: Negative for cough, sputum production, shortness of breath and wheezing.   Cardiovascular: Negative for chest pain, palpitations, orthopnea and leg swelling.  Gastrointestinal: Negative for heartburn, nausea, vomiting, abdominal pain, diarrhea and blood in  stool.  Genitourinary: Negative for dysuria, urgency and frequency.  Musculoskeletal: Negative for myalgias and joint pain.  Skin: Negative for rash.  Neurological: Negative for dizziness, sensory change, focal weakness, weakness and headaches.  Psychiatric/Behavioral: Negative for depression. The patient is not nervous/anxious.       Objective  Filed Vitals:   08/04/14 0828  BP: 151/77  Pulse: 82  Temp: 98 F (36.7 C)  Resp: 16  Height: 5\' 10"  (1.778 m)  Weight: 177 lb 12.8 oz (80.65 kg)    Physical Exam  Constitutional: He is well-developed, well-nourished, and in no distress.  HENT:  Head: Normocephalic and atraumatic.  Eyes: Conjunctivae and EOM are normal. Pupils are equal, round, and reactive to light. No scleral icterus.  Neck: Normal range of motion. Neck supple. No thyromegaly present.  Cardiovascular: Normal rate, regular rhythm, normal heart sounds and intact distal pulses.  Exam reveals no gallop and no friction rub.   No murmur heard. Pulmonary/Chest: Effort normal and breath sounds normal. No respiratory distress. He has no wheezes. He has no rales.  Abdominal: Soft. Bowel sounds are normal. He exhibits no distension and no mass. There is no tenderness.  Musculoskeletal: Normal range of motion. He exhibits no edema.  Lymphadenopathy:    He has no  cervical adenopathy.  Skin: Skin is warm and dry.  Vitals reviewed.         Assessment & Plan  Problem List Items Addressed This Visit    None      1. Essential hypertension  - amLODipine (NORVASC) 10 MG tablet; Take 1 tablet (10 mg total) by mouth daily.  Dispense: 90 tablet; Refill: 3 - losartan (COZAAR) 100 MG tablet; Take 1 tablet (100 mg total) by mouth daily.  Dispense: 90 tablet; Refill: 3  2. Cancer of kidney, right   3. Alcohol abuse   4. Tobacco abuse

## 2014-08-04 NOTE — Patient Instructions (Signed)
Try to reduce tobacco and alcohol use.

## 2014-10-08 ENCOUNTER — Ambulatory Visit: Payer: 59 | Admitting: Family Medicine

## 2015-01-30 ENCOUNTER — Ambulatory Visit: Payer: 59 | Admitting: Urology

## 2015-02-11 ENCOUNTER — Ambulatory Visit (INDEPENDENT_AMBULATORY_CARE_PROVIDER_SITE_OTHER): Payer: 59 | Admitting: Urology

## 2015-02-11 VITALS — BP 142/85 | HR 81 | Ht 70.0 in | Wt 182.3 lb

## 2015-02-11 DIAGNOSIS — N183 Chronic kidney disease, stage 3 (moderate): Secondary | ICD-10-CM

## 2015-02-11 DIAGNOSIS — C641 Malignant neoplasm of right kidney, except renal pelvis: Secondary | ICD-10-CM

## 2015-02-11 DIAGNOSIS — R3129 Other microscopic hematuria: Secondary | ICD-10-CM

## 2015-02-11 NOTE — Progress Notes (Signed)
02/11/2015 3:01 PM   Kenneth Maxwell November 29, 1955 [id number]  Referring provider: Arlis Porta., MD [address] [place],  [address]  Chief Complaint  Patient presents with  . RCC    34month    HPI: 60 year old male with right upper pole renal mass who underwent right open radical nephrectomy, non-adrenal sparing on 11/11/2013.   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 His margins and adrenal gland were negative. Preoperatively, there was a question of possible direct extension into the liver, however, intraoperatively there was a clear plane between these 2 structures.   He was scheduled to return in 6 months postop for CT abdomen pelvis but never did so. He did have his imaging study from around that time which showed no evidence of recurrent disease.  He has had a workup for gross hematuria in the past status post cystoscopy on 10/2013 which was negative.  He returns today a little over one year following right radical nephrectomy.  Today, he denies any urinary issues. No gross hematuria. He continues to smoke heavily and drink.  PMH: Past Medical History  Diagnosis Date  . Arthritis   . Blood clotting disorder ([place])   . Hypertension   . Renal cell carcinoma ([place]) 11/2013    kidney cancer s/p R nephrectomy  . Chronic renal disease   . Right renal mass   . Hematuria, gross   . H/O urinary retention   . Alcohol abuse     Surgical History: Past Surgical History  Procedure Laterality Date  . Nephrectomy radical      R side kidney cancer surgery    Home Medications:    Medication List       This list is accurate as of: 02/11/15 11:59 PM.  Always use your most recent med list.               amLODipine 10 MG tablet  Commonly known as:  NORVASC  Take 1 tablet (10 mg total) by mouth daily.     losartan 100 MG tablet  Commonly known as:  COZAAR  Take 1 tablet (100 mg total)  by mouth daily.        Allergies: No Known Allergies  Family History: Family History  Problem Relation Age of Onset  . Hypertension Mother   . Hypertension Father   . Heart attack Father   . Diabetes Mother     Social History:  reports that he has been smoking Cigarettes.  He has been smoking about 1.50 packs per day. He does not have any smokeless tobacco history on file. He reports that he drinks about 29.4 oz of alcohol per week. He reports that he does not use illicit drugs.  ROS: UROLOGY Frequent Urination?: No Hard to postpone urination?: No Burning/pain with urination?: No Get up at night to urinate?: Yes Leakage of urine?: No Urine stream starts and stops?: No Trouble starting stream?: No Do you have to strain to urinate?: No Blood in urine?: No Urinary tract infection?: No Sexually transmitted disease?: No Injury to kidneys or bladder?: No Painful intercourse?: No Weak stream?: No Erection problems?: No Penile pain?: No  Gastrointestinal Nausea?: No Vomiting?: No Indigestion/heartburn?: No Diarrhea?: No Constipation?: No  Constitutional Fever: No Night sweats?: No Weight loss?: No Fatigue?: No  Skin Skin rash/lesions?: No Itching?: No  Eyes Blurred vision?: No Double vision?: No  Ears/Nose/Throat Sore throat?: No Sinus problems?: No  Hematologic/Lymphatic Swollen glands?: No Easy bruising?: No  Cardiovascular Leg swelling?: No Chest pain?: No  Respiratory Cough?: No Shortness of breath?: No  Endocrine Excessive thirst?: No  Musculoskeletal Back pain?: No Joint pain?: No  Neurological Headaches?: No Dizziness?: No  Psychologic Depression?: No Anxiety?: No  Physical Exam: BP 142/85 mmHg  Pulse 81  Ht 5\' 10"  (1.778 m)  Wt 182 lb 4.8 oz (82.691 kg)  BMI 26.16 kg/m2  Constitutional:  Alert and oriented, No acute distress.  Smells of cigarette smoke. HEENT: [place] AT, moist mucus membranes.  Trachea midline, no masses.  Sclerae are injected. Poor dentition. Cardiovascular: No clubbing, cyanosis, or edema. Respiratory: Normal respiratory effort, no increased work of breathing. GI: Abdomen is soft, nontender, nondistended, no abdominal masses.  Right flank incision well healed. Some mild laxity of abdominal wall musculature but no hernia. GU: No CVA tenderness.  Skin: No rashes, bruises or suspicious lesions. Neurologic: Grossly intact, no focal deficits, moving all 4 extremities. Psychiatric: Normal mood and affect.  Laboratory Data: No recent laboratory data, refused BMP today.  Initial postop creatinine 1.42.  Urinalysis Results for orders placed or performed in visit on 02/11/15  Microscopic Examination  Result Value Ref Range   WBC, UA 6-10 (A) 0 -  5 /hpf   RBC, UA 3-10 (A) 0 -  2 /hpf   Epithelial Cells (non renal) None seen 0 - 10 /hpf   Mucus, UA Present (A) Not Estab.   Bacteria, UA Few None seen/Few  Urinalysis, Complete  Result Value Ref Range   Specific Gravity, UA 1.015 1.005 - 1.030   pH, UA 7.0 5.0 - 7.5   Color, UA Yellow Yellow   Appearance Ur Clear Clear   Leukocytes, UA Negative Negative   Protein, UA 2+ (A) Negative/Trace   Glucose, UA Negative Negative   Ketones, UA Trace (A) Negative   RBC, UA Trace (A) Negative   Bilirubin, UA Negative Negative   Urobilinogen, Ur 1.0 0.2 - 1.0 mg/dL   Nitrite, UA Negative Negative   Microscopic Examination See below:     Pertinent Imaging: CLINICAL DATA: Right renal mass. Patient's patient status post right nephrectomy. Subsequent treatment strategy.  CT ABDOMEN AND PELVIS WITHOUT AND WITH CONTRAST  TECHNIQUE: Multidetector CT imaging of the abdomen and pelvis was performed following the standard protocol before and following the bolus administration of intravenous contrast.  CONTRAST: 170mL OMNIPAQUE IOHEXOL 350 MG/ML SOLN  COMPARISON: MRI 10/31/2013, CT 10/30/2013.  FINDINGS: Lower chest: Lung bases are  clear.  Hepatobiliary: No focal hepatic lesion. Small gallstones the lumen gallbladder.  Pancreas: Pancreas is normal. No ductal dilatation. No pancreatic inflammation.  Spleen: Normal spleen  Adrenals/urinary tract: Patient status post right adrenalectomy and right nephrectomy. There is no nodularity within the nephrectomy bed.  The left adrenal gland is thickened with low-attenuation tissue consistent with a benign left adrenal adenoma.  There is no enhancing lesion of the left renal cortex. Delayed pyelogram phase imaging demonstrates no filling defect within the left renal collecting system or ureter.  Left retro aortic renal vein incidentally noted.  No bladder calculi, enhancing bladder lesions, or filling defect within the bladder.  Stomach/Bowel: Stomach, small bowel, appendix, and cecum are normal. The colon and rectosigmoid colon are normal.  Vascular/Lymphatic: Abdominal aorta is normal caliber. There is no retroperitoneal or periportal lymphadenopathy. No pelvic lymphadenopathy.  Reproductive: Prostate is normal.  Musculoskeletal: No aggressive osseous lesion.  Other: No free fluid.  IMPRESSION: 1. No evidence of local recurrence in  the right nephrectomy bed. 2. No enhancing left renal lesion.   Electronically Signed  By: Suzy Bouchard M.D.  On: 06/30/2014 16:02      Result Notes     Notes Recorded by Hollice Espy, MD on 07/15/2014 at 5:41 PM This patient needs his 6 months follow up apt. Can't see that its scheduled. CT looks good but still need to see patient back in the office.   Hollice Espy, MD                                    Previous CT scan reviewed personally today.   Assessment & Plan:    1. Cancer of kidney, right ([place]) Recommend repeat CT abd/ pelvic given T3 disease, if negative can spread out imaging to annually- will call with results CXR as well  - Urinalysis, Complete -  Basic metabolic panel - CT Abdomen Pelvis W Wo Contrast; Future - Chest 1 View; Future  2. Microscopic hematuria Persistent microscopic hematuria  S/p negative cystoscopy 10/2013 Discussed given extensive smoking history, he should consider repeat cystoscopy.  Not  Plan for repeat UA next visit and will revisit possibility   3. CKD Recommend BMP, must have prior to CT scan Refused blood work today as it makes him vasovagal and he is on his motorcycle Agreed to return next week for blood work Reviewed solitary kidney precautions   Return in about 1 year (around 02/11/2016) for f/u CT abd/ pelvis results (needs lab draw next week, lab only apt).  Hollice Espy, MD  [facility] [address], [place], [place] [address] [phone]

## 2015-02-12 ENCOUNTER — Encounter: Payer: Self-pay | Admitting: Urology

## 2015-02-12 LAB — URINALYSIS, COMPLETE
BILIRUBIN UA: NEGATIVE
Glucose, UA: NEGATIVE
Leukocytes, UA: NEGATIVE
Nitrite, UA: NEGATIVE
Specific Gravity, UA: 1.015 (ref 1.005–1.030)
Urobilinogen, Ur: 1 mg/dL (ref 0.2–1.0)
pH, UA: 7 (ref 5.0–7.5)

## 2015-02-12 LAB — MICROSCOPIC EXAMINATION: Epithelial Cells (non renal): NONE SEEN /hpf (ref 0–10)

## 2015-02-18 ENCOUNTER — Other Ambulatory Visit: Payer: 59

## 2015-03-25 ENCOUNTER — Telehealth: Payer: Self-pay

## 2015-03-25 NOTE — Telephone Encounter (Signed)
-----   Message from Hollice Espy, MD sent at 03/24/2015  2:17 PM EDT ----- [name] never got his labs.  Can you follow up with him and remind him?  Hollice Espy, MD  ----- Message -----    From: SYSTEM    Sent: 02/16/2015  12:04 AM      To: Hollice Espy, MD

## 2015-03-25 NOTE — Progress Notes (Signed)
Left pt mess to call/SW 

## 2015-03-25 NOTE — Telephone Encounter (Signed)
Left pt mess to call/SW 

## 2015-03-26 NOTE — Telephone Encounter (Signed)
Left pt mess to call/SW 

## 2015-04-07 NOTE — Telephone Encounter (Signed)
Left pt mess to call/SW 

## 2015-06-03 ENCOUNTER — Telehealth: Payer: Self-pay

## 2015-06-03 NOTE — Telephone Encounter (Signed)
-----   Message from Hollice Espy, MD sent at 03/24/2015  2:17 PM EDT ----- [name] never got his labs.  Can you follow up with him and remind him?  Hollice Espy, MD  ----- Message -----    From: SYSTEM    Sent: 02/16/2015  12:04 AM      To: Hollice Espy, MD

## 2015-06-03 NOTE — Telephone Encounter (Signed)
Spoke with pt wife in reference to pt never getting labs. Wife stated that she would speak with pt tonight and call back in the morning.

## 2015-06-04 ENCOUNTER — Telehealth: Payer: Self-pay

## 2015-06-04 DIAGNOSIS — IMO0002 Reserved for concepts with insufficient information to code with codable children: Secondary | ICD-10-CM

## 2015-06-04 DIAGNOSIS — Q600 Renal agenesis, unilateral: Secondary | ICD-10-CM

## 2015-06-04 NOTE — Telephone Encounter (Signed)
Spoke with pt wife in reference printed orders. Orders were left up front. Wife voiced understanding.

## 2015-06-04 NOTE — Telephone Encounter (Signed)
Pt wife called stating pt has not had labs done due not wanting to pay for them. Wife stated she works for [employer] and can get the labs done for free at their site. Wife requested printed orders. Please advise.

## 2015-07-13 ENCOUNTER — Telehealth: Payer: Self-pay | Admitting: Family Medicine

## 2015-07-13 ENCOUNTER — Other Ambulatory Visit: Payer: Self-pay | Admitting: Family Medicine

## 2015-07-13 DIAGNOSIS — I10 Essential (primary) hypertension: Secondary | ICD-10-CM

## 2015-07-13 MED ORDER — LOSARTAN POTASSIUM 100 MG PO TABS: 100.0000 mg | ORAL_TABLET | Freq: Every day | ORAL | Status: DC

## 2015-07-13 MED ORDER — AMLODIPINE BESYLATE 10 MG PO TABS: 10.0000 mg | ORAL_TABLET | Freq: Every day | ORAL | Status: DC

## 2015-07-13 NOTE — Telephone Encounter (Signed)
Pt needs refills on amlodipine and losartan sent to [facility]

## 2015-07-13 NOTE — Telephone Encounter (Signed)
Rx send for 1 month only pt needs an appointment for further refill called and Left message.

## 2015-07-16 ENCOUNTER — Encounter: Payer: Self-pay | Admitting: Family Medicine

## 2015-07-16 ENCOUNTER — Ambulatory Visit (INDEPENDENT_AMBULATORY_CARE_PROVIDER_SITE_OTHER): Payer: 59 | Admitting: Family Medicine

## 2015-07-16 VITALS — BP 155/80 | HR 83 | Temp 98.3°F | Resp 16 | Ht 70.0 in | Wt 177.0 lb

## 2015-07-16 DIAGNOSIS — Z8679 Personal history of other diseases of the circulatory system: Secondary | ICD-10-CM | POA: Diagnosis not present

## 2015-07-16 DIAGNOSIS — I10 Essential (primary) hypertension: Secondary | ICD-10-CM

## 2015-07-16 MED ORDER — LOSARTAN POTASSIUM 100 MG PO TABS: 100.0000 mg | ORAL_TABLET | Freq: Every day | ORAL | Status: DC

## 2015-07-16 MED ORDER — AMLODIPINE BESYLATE 10 MG PO TABS: 10.0000 mg | ORAL_TABLET | Freq: Every day | ORAL | Status: DC

## 2015-07-16 NOTE — Patient Instructions (Signed)
Check BMP on return

## 2015-07-16 NOTE — Progress Notes (Signed)
Name: Kenneth Maxwell   MRN: [id number]    DOB: 1955-01-25   Date:07/16/2015       Progress Note  Subjective  Chief Complaint  Chief Complaint  Patient presents with  . Hypertension    HPI Here for f/u of HBP.  He has been off BP meds all last week and did not restart again until 3 days ago.  It was suggested that we get BMP to follow kidney and electrolytes.  He refused.  He said he would consider in future  No problem-specific assessment & plan notes found for this encounter.   Past Medical History  Diagnosis Date  . Arthritis   . Blood clotting disorder ([place])   . Hypertension   . Renal cell carcinoma ([place]) 11/2013    kidney cancer s/p R nephrectomy  . Chronic renal disease   . Right renal mass   . Hematuria, gross   . H/O urinary retention   . Alcohol abuse     Past Surgical History  Procedure Laterality Date  . Nephrectomy radical      R side kidney cancer surgery    Family History  Problem Relation Age of Onset  . Hypertension Mother   . Hypertension Father   . Heart attack Father   . Diabetes Mother     Social History   Social History  . Marital Status: Married    Spouse Name: N/A  . Number of Children: N/A  . Years of Education: N/A   Occupational History  . Not on file.   Social History Main Topics  . Smoking status: Current Every Day Smoker -- 1.50 packs/day    Types: Cigarettes  . Smokeless tobacco: Never Used  . Alcohol Use: 29.4 oz/week    49 Cans of beer per week  . Drug Use: No  . Sexual Activity: Not on file   Other Topics Concern  . Not on file   Social History Narrative     Current outpatient prescriptions:  .  amLODipine (NORVASC) 10 MG tablet, Take 1 tablet (10 mg total) by mouth daily., Disp: 30 tablet, Rfl: 12 .  losartan (COZAAR) 100 MG tablet, Take 1 tablet (100 mg total) by mouth daily., Disp: 30 tablet, Rfl: 12  Not on File   Review of Systems  Constitutional: Negative for fever, chills, weight loss and  malaise/fatigue.  HENT: Negative for hearing loss.   Eyes: Negative for blurred vision and double vision.  Respiratory: Negative for cough, shortness of breath and wheezing.   Cardiovascular: Negative for chest pain, palpitations and leg swelling.  Gastrointestinal: Negative for heartburn, abdominal pain and blood in stool.  Genitourinary: Negative for dysuria, urgency and frequency.  Skin: Negative for rash.  Neurological: Negative for dizziness, tremors, weakness and headaches.  Psychiatric/Behavioral: Negative for depression. The patient is not nervous/anxious and does not have insomnia.       Objective  Filed Vitals:   07/16/15 1503 07/16/15 1529  BP: 156/87 155/80  Pulse: 83   Temp: 98.3 F (36.8 C)   TempSrc: Oral   Resp: 16   Height: 5\' 10"  (1.778 m)   Weight: 177 lb (80.287 kg)     Physical Exam  Constitutional: He is oriented to person, place, and time and well-developed, well-nourished, and in no distress. No distress.  HENT:  Head: Normocephalic and atraumatic.  Eyes: Conjunctivae and EOM are normal. Pupils are equal, round, and reactive to light. No scleral icterus.  Neck: Normal range of motion. Carotid bruit  is not present. No thyromegaly present.  Cardiovascular: Normal rate, regular rhythm and normal heart sounds.  Exam reveals no gallop and no friction rub.   No murmur heard. Pulmonary/Chest: Effort normal and breath sounds normal. No respiratory distress. He has no wheezes. He has no rales.  Abdominal: Soft. Bowel sounds are normal.  Musculoskeletal: He exhibits no edema.  Lymphadenopathy:    He has no cervical adenopathy.  Neurological: He is alert and oriented to person, place, and time.  Vitals reviewed.      No results found for this or any previous visit (from the past 2160 hour(s)).   Assessment & Plan  Problem List Items Addressed This Visit      Cardiovascular and Mediastinum   Hypertension - Primary   Relevant Medications    amLODipine (NORVASC) 10 MG tablet   losartan (COZAAR) 100 MG tablet     Other   H/O cardiovascular disorder      Meds ordered this encounter  Medications  . amLODipine (NORVASC) 10 MG tablet    Sig: Take 1 tablet (10 mg total) by mouth daily.    Dispense:  30 tablet    Refill:  12  . losartan (COZAAR) 100 MG tablet    Sig: Take 1 tablet (100 mg total) by mouth daily.    Dispense:  30 tablet    Refill:  12   1. Essential hypertension  - amLODipine (NORVASC) 10 MG tablet; Take 1 tablet (10 mg total) by mouth daily.  Dispense: 30 tablet; Refill: 12 - losartan (COZAAR) 100 MG tablet; Take 1 tablet (100 mg total) by mouth daily.  Dispense: 30 tablet; Refill: 12 RTC-6 weeks 2. H/O cardiovascular disorder

## 2015-08-27 ENCOUNTER — Encounter: Payer: Self-pay | Admitting: Family Medicine

## 2015-08-27 ENCOUNTER — Ambulatory Visit (INDEPENDENT_AMBULATORY_CARE_PROVIDER_SITE_OTHER): Payer: 59 | Admitting: Family Medicine

## 2015-08-27 VITALS — BP 150/80 | HR 78 | Temp 98.4°F | Resp 16 | Ht 70.0 in | Wt 179.0 lb

## 2015-08-27 DIAGNOSIS — I10 Essential (primary) hypertension: Secondary | ICD-10-CM | POA: Diagnosis not present

## 2015-08-27 MED ORDER — CHLORTHALIDONE 25 MG PO TABS: 25.0000 mg | ORAL_TABLET | Freq: Every day | ORAL | 12 refills | Status: DC

## 2015-08-27 NOTE — Progress Notes (Signed)
Name: Kenneth Maxwell   MRN: [id number]    DOB: July 23, 1955   Date:08/27/2015       Progress Note  Subjective  Chief Complaint  Chief Complaint  Patient presents with  . Hypertension    HPI Here for f/u of HBP.  Taking meds.  BPs at home in 140s sys/85 dias.  No problem-specific Assessment & Plan notes found for this encounter.   Past Medical History:  Diagnosis Date  . Alcohol abuse   . Arthritis   . Blood clotting disorder ([place])   . Chronic renal disease   . H/O urinary retention   . Hematuria, gross   . Hypertension   . Renal cell carcinoma ([place]) 11/2013   kidney cancer s/p R nephrectomy  . Right renal mass     Past Surgical History:  Procedure Laterality Date  . NEPHRECTOMY RADICAL     R side kidney cancer surgery    Family History  Problem Relation Age of Onset  . Hypertension Mother   . Hypertension Father   . Heart attack Father   . Diabetes Mother     Social History   Social History  . Marital status: Married    Spouse name: N/A  . Number of children: N/A  . Years of education: N/A   Occupational History  . Not on file.   Social History Main Topics  . Smoking status: Current Every Day Smoker    Packs/day: 1.50    Types: Cigarettes  . Smokeless tobacco: Never Used  . Alcohol use 29.4 oz/week    49 Cans of beer per week  . Drug use: No  . Sexual activity: Not on file   Other Topics Concern  . Not on file   Social History Narrative  . No narrative on file     Current Outpatient Prescriptions:  .  amLODipine (NORVASC) 10 MG tablet, Take 1 tablet (10 mg total) by mouth daily., Disp: 30 tablet, Rfl: 12 .  chlorthalidone (HYGROTON) 25 MG tablet, Take 1 tablet (25 mg total) by mouth daily., Disp: 30 tablet, Rfl: 12 .  losartan (COZAAR) 100 MG tablet, Take 1 tablet (100 mg total) by mouth daily., Disp: 30 tablet, Rfl: 12  Not on File   Review of Systems  Constitutional: Negative for chills, fever, malaise/fatigue and weight loss.   HENT: Negative for hearing loss.   Eyes: Negative for blurred vision and double vision.  Respiratory: Negative for cough, shortness of breath and wheezing.   Cardiovascular: Negative for chest pain, palpitations and leg swelling.  Gastrointestinal: Negative for abdominal pain, blood in stool and heartburn.  Genitourinary: Negative for dysuria, frequency and urgency.  Musculoskeletal: Negative for myalgias.  Skin: Negative for rash.  Neurological: Negative for dizziness, tremors, weakness and headaches.      Objective  Vitals:   08/27/15 1452 08/27/15 1509  BP: (!) 148/91 (!) 150/80  Pulse: 78   Resp: 16   Temp: 98.4 F (36.9 C)   TempSrc: Oral   Weight: 179 lb (81.2 kg)   Height: 5\' 10"  (1.778 m)     Physical Exam  Constitutional: He is oriented to person, place, and time and well-developed, well-nourished, and in no distress. No distress.  HENT:  Head: Normocephalic and atraumatic.  Eyes: Conjunctivae and EOM are normal. Pupils are equal, round, and reactive to light. No scleral icterus.  Neck: Normal range of motion. Neck supple. Carotid bruit is not present. No thyromegaly present.  Cardiovascular: Normal rate, regular rhythm and  normal heart sounds.  Exam reveals no gallop and no friction rub.   No murmur heard. Pulmonary/Chest: Effort normal and breath sounds normal. No respiratory distress. He has no wheezes. He has no rales.  Abdominal: Soft.  Musculoskeletal: He exhibits no edema.  Lymphadenopathy:    He has no cervical adenopathy.  Neurological: He is alert and oriented to person, place, and time.  Vitals reviewed.      No results found for this or any previous visit (from the past 2160 hour(s)).   Assessment & Plan  Problem List Items Addressed This Visit      Cardiovascular and Mediastinum   Hypertension - Primary   Relevant Medications   chlorthalidone (HYGROTON) 25 MG tablet    Other Visit Diagnoses   None.     Meds ordered this encounter   Medications  . chlorthalidone (HYGROTON) 25 MG tablet    Sig: Take 1 tablet (25 mg total) by mouth daily.    Dispense:  30 tablet    Refill:  12  1. Essential hypertension Cont Losartan and Amlodipine - chlorthalidone (HYGROTON) 25 MG tablet; Take 1 tablet (25 mg total) by mouth daily.  Dispense: 30 tablet; Refill: 12 Discussed stopping smoking, but he is not ready to try

## 2015-10-06 ENCOUNTER — Other Ambulatory Visit: Payer: Self-pay | Admitting: Family Medicine

## 2015-10-06 ENCOUNTER — Encounter: Payer: Self-pay | Admitting: Family Medicine

## 2015-10-06 ENCOUNTER — Ambulatory Visit (INDEPENDENT_AMBULATORY_CARE_PROVIDER_SITE_OTHER): Payer: 59 | Admitting: Family Medicine

## 2015-10-06 VITALS — BP 125/65 | HR 81 | Temp 98.9°F | Resp 16 | Ht 70.0 in | Wt 178.0 lb

## 2015-10-06 DIAGNOSIS — Z8679 Personal history of other diseases of the circulatory system: Secondary | ICD-10-CM

## 2015-10-06 DIAGNOSIS — I10 Essential (primary) hypertension: Secondary | ICD-10-CM | POA: Diagnosis not present

## 2015-10-06 DIAGNOSIS — Z720 Tobacco use: Secondary | ICD-10-CM | POA: Diagnosis not present

## 2015-10-06 DIAGNOSIS — F1010 Alcohol abuse, uncomplicated: Secondary | ICD-10-CM

## 2015-10-06 MED ORDER — AMLODIPINE BESYLATE 10 MG PO TABS: 10.0000 mg | ORAL_TABLET | Freq: Every day | ORAL | 3 refills | Status: DC

## 2015-10-06 MED ORDER — LOSARTAN POTASSIUM 100 MG PO TABS: 100.0000 mg | ORAL_TABLET | Freq: Every day | ORAL | 3 refills | Status: DC

## 2015-10-06 MED ORDER — CHLORTHALIDONE 25 MG PO TABS: 25.0000 mg | ORAL_TABLET | Freq: Every day | ORAL | 3 refills | Status: DC

## 2015-10-06 NOTE — Patient Instructions (Signed)
Patient declines flu shot

## 2015-10-06 NOTE — Progress Notes (Signed)
Name: Kenneth Maxwell   MRN: [id number]    DOB: [birth year]   Date:10/06/2015       Progress Note  Subjective  Chief Complaint  Chief Complaint  Patient presents with  . Follow-up    BP    HPI Here for f/u of HBP.  HE is taking meds and has no c/o.  He is still drinking alcohol at a high level.  No problem-specific Assessment & Plan notes found for this encounter.   Past Medical History:  Diagnosis Date  . Alcohol abuse   . Arthritis   . Blood clotting disorder ([place])   . Chronic renal disease   . H/O urinary retention   . Hematuria, gross   . Hypertension   . Renal cell carcinoma ([place]) 11/2013   kidney cancer s/p R nephrectomy  . Right renal mass     Past Surgical History:  Procedure Laterality Date  . NEPHRECTOMY RADICAL     R side kidney cancer surgery    Family History  Problem Relation Age of Onset  . Hypertension Mother   . Hypertension Father   . Heart attack Father   . Diabetes Mother     Social History   Social History  . Marital status: Married    Spouse name: N/A  . Number of children: N/A  . Years of education: N/A   Occupational History  . Not on file.   Social History Main Topics  . Smoking status: Current Every Day Smoker    Packs/day: 1.50    Types: Cigarettes  . Smokeless tobacco: Never Used  . Alcohol use 29.4 oz/week    49 Cans of beer per week  . Drug use: No  . Sexual activity: Yes   Other Topics Concern  . Not on file   Social History Narrative  . No narrative on file     Current Outpatient Prescriptions:  .  amLODipine (NORVASC) 10 MG tablet, Take 1 tablet (10 mg total) by mouth daily., Disp: 90 tablet, Rfl: 3 .  chlorthalidone (HYGROTON) 25 MG tablet, Take 1 tablet (25 mg total) by mouth daily., Disp: 90 tablet, Rfl: 3 .  losartan (COZAAR) 100 MG tablet, Take 1 tablet (100 mg total) by mouth daily., Disp: 90 tablet, Rfl: 3  Not on File   Review of Systems  Constitutional: Negative for chills, fever,  malaise/fatigue and weight loss.  HENT: Negative for hearing loss.   Eyes: Negative for blurred vision and double vision.  Respiratory: Negative for cough, shortness of breath and wheezing.   Cardiovascular: Negative for chest pain, palpitations and leg swelling.  Gastrointestinal: Negative for abdominal pain, blood in stool and heartburn.  Genitourinary: Negative for dysuria, frequency and urgency.  Musculoskeletal: Negative for myalgias.  Skin: Negative for rash.  Neurological: Negative for dizziness, tingling, tremors, weakness and headaches.      Objective  Vitals:   10/06/15 1524 10/06/15 1536  BP: 135/80 125/65  Pulse: 81   Resp: 16   Temp: 98.9 F (37.2 C)   TempSrc: Oral   Weight: 80.7 kg (178 lb)   Height: 5\' 10"  (1.778 m)     Physical Exam  Constitutional: He is oriented to person, place, and time and well-developed, well-nourished, and in no distress. No distress.  HENT:  Head: Normocephalic and atraumatic.  Eyes: Conjunctivae and EOM are normal. Pupils are equal, round, and reactive to light. No scleral icterus.  Neck: Normal range of motion. Carotid bruit is not present. No thyromegaly present.  Cardiovascular: Normal rate, regular rhythm and normal heart sounds.  Exam reveals no gallop and no friction rub.   No murmur heard. Pulmonary/Chest: Effort normal and breath sounds normal. No respiratory distress. He has no wheezes. He has no rales.  Abdominal: Soft. Bowel sounds are normal. He exhibits no distension and no mass. There is no tenderness.  Musculoskeletal: He exhibits no edema.  Lymphadenopathy:    He has no cervical adenopathy.  Neurological: He is alert and oriented to person, place, and time.  Vitals reviewed.      No results found for this or any previous visit (from the past 2160 hour(s)).   Assessment & Plan  Problem List Items Addressed This Visit      Cardiovascular and Mediastinum   Hypertension - Primary   Relevant Medications    losartan (COZAAR) 100 MG tablet   chlorthalidone (HYGROTON) 25 MG tablet   amLODipine (NORVASC) 10 MG tablet   Other Relevant Orders   Comprehensive metabolic panel     Other   Tobacco use   Relevant Orders   CBC with Differential   Alcohol abuse   H/O cardiovascular disorder   Relevant Orders   Lipid Profile    Other Visit Diagnoses   None.     Meds ordered this encounter  Medications  . losartan (COZAAR) 100 MG tablet    Sig: Take 1 tablet (100 mg total) by mouth daily.    Dispense:  90 tablet    Refill:  3  . chlorthalidone (HYGROTON) 25 MG tablet    Sig: Take 1 tablet (25 mg total) by mouth daily.    Dispense:  90 tablet    Refill:  3  . amLODipine (NORVASC) 10 MG tablet    Sig: Take 1 tablet (10 mg total) by mouth daily.    Dispense:  90 tablet    Refill:  3   1. Essential hypertension  - Comprehensive metabolic panel - losartan (COZAAR) 100 MG tablet; Take 1 tablet (100 mg total) by mouth daily.  Dispense: 90 tablet; Refill: 3 - chlorthalidone (HYGROTON) 25 MG tablet; Take 1 tablet (25 mg total) by mouth daily.  Dispense: 90 tablet; Refill: 3 - amLODipine (NORVASC) 10 MG tablet; Take 1 tablet (10 mg total) by mouth daily.  Dispense: 90 tablet; Refill: 3  2. Tobacco use  - CBC with Differential  3. H/O cardiovascular disorder  - Lipid Profile  4. Alcohol abuse

## 2015-11-23 ENCOUNTER — Telehealth: Payer: Self-pay | Admitting: Family Medicine

## 2015-11-23 NOTE — Telephone Encounter (Signed)
Pt went to do labs on 17th but was unable to do draw because he almost passed out.  His wife Cordie Grice asked if he could get something called in for anxiety.  Her call back number is [phone]

## 2015-11-23 NOTE — Telephone Encounter (Signed)
We can write him a Rx for 1 Valium, 5 mg tablet.  If she wants that, she needs to come get it tomorrow to have blood drawn later this week or early next week.  This is for 1 time only.-jh

## 2015-11-24 MED ORDER — DIAZEPAM 5 MG PO TABS: 5.0000 mg | ORAL_TABLET | Freq: Once | ORAL | 0 refills | Status: AC

## 2015-11-24 NOTE — Telephone Encounter (Signed)
rx printed.[place]

## 2016-02-12 ENCOUNTER — Telehealth: Payer: Self-pay | Admitting: Urology

## 2016-02-12 ENCOUNTER — Ambulatory Visit: Payer: 59 | Admitting: Urology

## 2016-02-12 ENCOUNTER — Encounter: Payer: Self-pay | Admitting: Urology

## 2016-02-12 NOTE — Telephone Encounter (Signed)
Please call this patient to reschedule his CT scan and follow-up. If he does not respond, [facility] stressing the importance of routine follow-up for his history of kidney cancer.  Kenneth Espy, MD

## 2016-02-15 NOTE — Telephone Encounter (Signed)
I have left a message for him to call me back. I also checked and he never got a call from [facility] to get his CT scan. I made sure it was approved and I will make sure this gets [facility] and he calls me back. If he doesn't I will let you know.  Thanks,  Sharyn Lull

## 2016-02-17 ENCOUNTER — Telehealth: Payer: Self-pay

## 2016-02-17 NOTE — Telephone Encounter (Signed)
Certified letter sent. Tracking #[id number]

## 2016-02-17 NOTE — Telephone Encounter (Signed)
Patient has still not responded, [facility] will send patient a certified letter.  Sharyn Lull

## 2016-02-29 ENCOUNTER — Telehealth: Payer: Self-pay

## 2016-02-29 NOTE — Telephone Encounter (Signed)
Pt signed for certified letter on [id number].

## 2016-04-05 ENCOUNTER — Ambulatory Visit (INDEPENDENT_AMBULATORY_CARE_PROVIDER_SITE_OTHER): Payer: 59 | Admitting: Family Medicine

## 2016-04-05 ENCOUNTER — Encounter: Payer: Self-pay | Admitting: Family Medicine

## 2016-04-05 VITALS — BP 123/73 | HR 72 | Temp 98.0°F | Resp 16 | Ht 70.0 in | Wt 178.0 lb

## 2016-04-05 DIAGNOSIS — I10 Essential (primary) hypertension: Secondary | ICD-10-CM

## 2016-04-05 MED ORDER — LOSARTAN POTASSIUM 100 MG PO TABS: 100.0000 mg | ORAL_TABLET | Freq: Every day | ORAL | 3 refills | Status: DC

## 2016-04-05 MED ORDER — CHLORTHALIDONE 25 MG PO TABS: 25.0000 mg | ORAL_TABLET | Freq: Every day | ORAL | 3 refills | Status: DC

## 2016-04-05 MED ORDER — LOSARTAN POTASSIUM 100 MG PO TABS: 100.0000 mg | ORAL_TABLET | Freq: Every day | ORAL | 0 refills | Status: DC

## 2016-04-05 MED ORDER — AMLODIPINE BESYLATE 10 MG PO TABS: 10.0000 mg | ORAL_TABLET | Freq: Every day | ORAL | 0 refills | Status: DC

## 2016-04-05 MED ORDER — CHLORTHALIDONE 25 MG PO TABS: 25.0000 mg | ORAL_TABLET | Freq: Every day | ORAL | 0 refills | Status: DC

## 2016-04-05 MED ORDER — AMLODIPINE BESYLATE 10 MG PO TABS: 10.0000 mg | ORAL_TABLET | Freq: Every day | ORAL | 3 refills | Status: DC

## 2016-04-05 NOTE — Patient Instructions (Signed)
Thank you for coming in to clinic today.  1. Keep up the good work - BP controlled - Refilled all 3 meds 30 day supply to [facility] - also refilled all 3 meds 90 day supply +3 refills to [facility]  Please schedule a follow-up appointment with Dr. Parks Ranger in 6 months - for Annual Physical (would like to get blood tests, you can be NON fasting - prefer 8am first appointment)  If you have any other questions or concerns, please feel free to call the clinic or send a message through [place]. You may also schedule an earlier appointment if necessary.  Nobie Putnam, DO [place]

## 2016-04-05 NOTE — Assessment & Plan Note (Signed)
Well controlled BP on current regimen. Concern with active smoker tobacco abuse No known complication, however patient declines labs regularly, last lab Cr 2015  Plan: 1. Refilled meds today, no changes, continue current Losartan 100mg , Chlorthalidone 25mg , Amlodipine 10mg  daily - 30 day sent to [facility], and 90 day +3 refill sent to [facility] order 2. Encouraged lifestyle modifications, smoking cessation 3. Follow-up 6 months for Annual Physical - will consider non fasting labs at that time

## 2016-04-05 NOTE — Progress Notes (Signed)
Subjective:    Patient ID: Kenneth Maxwell, male    DOB: 1955-01-21, 61 y.o.   MRN: [id number]  Kenneth Maxwell is a 61 y.o. male presenting on 04/05/2016 for Hypertension   HPI   CHRONIC HTN: Prior dx HTN >40 years ago, admits he was resistant to taking anti-HTN meds for many years. Only taking meds for >5 years. Doing well. - Doing well, without concerns today. Requesting med refills all 3 meds, only as about 6-7 days of meds left, will need local rx to [facility] for 30 day, and then 90 day sent to mail order. Reports not checking BP outside office, did this in the past but no longer. Current Meds - Chlorthalidone 25mg  daily, Amlodipine 10mg  daily, Losartan 100mg  daily   Reports good compliance, took meds today. Tolerating well, w/o complaints. Lifestyle - Active, limited regular exercise - Active smoker, >1ppd Denies CP, dyspnea, HA, edema, dizziness / lightheadedness   Social History  Substance Use Topics  . Smoking status: Current Every Day Smoker    Packs/day: 1.50    Types: Cigarettes  . Smokeless tobacco: Never Used  . Alcohol use 29.4 oz/week    49 Cans of beer per week    Review of Systems Per HPI unless specifically indicated above     Objective:    BP 123/73 (BP Location: Left Arm, Patient Position: Sitting, Cuff Size: Normal)   Pulse 72   Temp 98 F (36.7 C) (Oral)   Resp 16   Ht 5\' 10"  (1.778 m)   Wt 178 lb (80.7 kg)   BMI 25.54 kg/m   Wt Readings from Last 3 Encounters:  04/05/16 178 lb (80.7 kg)  10/06/15 178 lb (80.7 kg)  08/27/15 179 lb (81.2 kg)    Physical Exam  Constitutional: He is oriented to person, place, and time. He appears well-developed and well-nourished. No distress.  Well-appearing appears older than stated age, comfortable, cooperative  HENT:  Head: Normocephalic and atraumatic.  Mouth/Throat: Oropharynx is clear and moist.  Eyes: Conjunctivae are normal.  Neck: Normal range of motion. Neck supple. No  thyromegaly present.  No carotid bruits  Cardiovascular: Normal rate, regular rhythm, normal heart sounds and intact distal pulses.   No murmur heard. Pulmonary/Chest: Effort normal and breath sounds normal. No respiratory distress. He has no wheezes. He has no rales.  Slightly reduced air movement bilateral. No wheezing or focal coarse breath sounds  Musculoskeletal: He exhibits no edema.  Lymphadenopathy:    He has no cervical adenopathy.  Neurological: He is alert and oriented to person, place, and time.  Skin: Skin is warm and dry. No rash noted. He is not diaphoretic. No erythema.  Psychiatric: He has a normal mood and affect. His behavior is normal.  Well groomed, good eye contact, normal speech and thoughts. Slightly anxious appearing since he was seen later than scheduled apt time  Nursing note and vitals reviewed.      Assessment & Plan:   Problem List Items Addressed This Visit    Hypertension - Primary    Well controlled BP on current regimen. Concern with active smoker tobacco abuse No known complication, however patient declines labs regularly, last lab Cr 2015  Plan: 1. Refilled meds today, no changes, continue current Losartan 100mg , Chlorthalidone 25mg , Amlodipine 10mg  daily - 30 day sent to [facility], and 90 day +3 refill sent to [facility] order 2. Encouraged lifestyle modifications, smoking cessation 3. Follow-up 6 months for Annual Physical - will  consider non fasting labs at that time      Relevant Medications   amLODipine (NORVASC) 10 MG tablet   chlorthalidone (HYGROTON) 25 MG tablet   losartan (COZAAR) 100 MG tablet      Meds ordered this encounter  Medications  . DISCONTD: amLODipine (NORVASC) 10 MG tablet    Sig: Take 1 tablet (10 mg total) by mouth daily.    Dispense:  30 tablet    Refill:  0  . DISCONTD: chlorthalidone (HYGROTON) 25 MG tablet    Sig: Take 1 tablet (25 mg total) by mouth daily.    Dispense:  30 tablet    Refill:  0  . DISCONTD:  losartan (COZAAR) 100 MG tablet    Sig: Take 1 tablet (100 mg total) by mouth daily.    Dispense:  30 tablet    Refill:  0  . amLODipine (NORVASC) 10 MG tablet    Sig: Take 1 tablet (10 mg total) by mouth daily.    Dispense:  90 tablet    Refill:  3  . chlorthalidone (HYGROTON) 25 MG tablet    Sig: Take 1 tablet (25 mg total) by mouth daily.    Dispense:  90 tablet    Refill:  3  . losartan (COZAAR) 100 MG tablet    Sig: Take 1 tablet (100 mg total) by mouth daily.    Dispense:  90 tablet    Refill:  3      Follow up plan: Return in about 6 months (around 10/05/2016) for blood pressure.  Nobie Putnam, [facility] 04/05/2016, 9:47 AM

## 2016-10-06 ENCOUNTER — Ambulatory Visit: Payer: 59 | Admitting: Family Medicine

## 2016-10-11 ENCOUNTER — Ambulatory Visit (INDEPENDENT_AMBULATORY_CARE_PROVIDER_SITE_OTHER): Payer: 59 | Admitting: Family Medicine

## 2016-10-11 ENCOUNTER — Encounter: Payer: Self-pay | Admitting: Family Medicine

## 2016-10-11 ENCOUNTER — Other Ambulatory Visit: Payer: Self-pay | Admitting: Family Medicine

## 2016-10-11 VITALS — BP 120/69 | HR 81 | Temp 98.6°F | Resp 16 | Ht 70.0 in | Wt 179.0 lb

## 2016-10-11 DIAGNOSIS — Z125 Encounter for screening for malignant neoplasm of prostate: Secondary | ICD-10-CM

## 2016-10-11 DIAGNOSIS — Z720 Tobacco use: Secondary | ICD-10-CM

## 2016-10-11 DIAGNOSIS — Z0000 Encounter for general adult medical examination without abnormal findings: Secondary | ICD-10-CM

## 2016-10-11 DIAGNOSIS — I10 Essential (primary) hypertension: Secondary | ICD-10-CM

## 2016-10-11 DIAGNOSIS — R7309 Other abnormal glucose: Secondary | ICD-10-CM

## 2016-10-11 DIAGNOSIS — Z85528 Personal history of other malignant neoplasm of kidney: Secondary | ICD-10-CM

## 2016-10-11 DIAGNOSIS — Z13220 Encounter for screening for lipoid disorders: Secondary | ICD-10-CM

## 2016-10-11 DIAGNOSIS — R799 Abnormal finding of blood chemistry, unspecified: Secondary | ICD-10-CM

## 2016-10-11 DIAGNOSIS — Z905 Acquired absence of kidney: Secondary | ICD-10-CM | POA: Insufficient documentation

## 2016-10-11 NOTE — Assessment & Plan Note (Addendum)
Well controlled BP on current regimen. Concern with active smoker tobacco abuse No known complication, however patient declines labs regularly, last lab Cr 2015  Plan: 1. Continue current Losartan 100mg , Chlorthalidone 25mg , Amlodipine 10mg  daily 2. Encouraged lifestyle modifications, smoking cessation 3. Start monitor BP outside office, bring readings to next visit, if persistently >140/90 or new symptoms notify office sooner 4. Follow-up 6 months for Annual Physical - he is to check with ins / may be able to get lab from [place] in future  Additionally reviewed my concern about his general health on these medicines if we cannot monitor his labs including kidney function, may need to discuss overall goal of therapy if continues to decline lab testing, limited ability to manage these meds if that is case

## 2016-10-11 NOTE — Progress Notes (Signed)
Subjective:    Patient ID: Kenneth Maxwell, male    DOB: 03/19/1955, 61 y.o.   MRN: [id number]  Kenneth Maxwell is a 61 y.o. male presenting on 10/11/2016 for Hypertension   HPI  CHRONIC HTN: Reports no new concerns. His BP has been controlled, not checking regularly outside office. - Has not had recent labs done, has declined before and then did not get drawn through [place], as he had problem with bad experience with actual venipuncture before difficult stick and multiple bruises Current Meds - Chlorthalidone 25mg  daily, Amlodipine 10mg  daily, Losartan 100mg  daily   Reports good compliance, took meds today. Tolerating well, w/o complaints. Lifestyle - Active, limited regular exercise Denies CP, dyspnea, HA, edema, dizziness / lightheadedness  Tobacco Abuse - Active smoke >1ppd, not ready to quit.  Additional PMH - patient is s/p R nephrectomy since 11/2013 secondary to Renal Cell Carcinoma  Health Maintenance: - Due for Flu Shot, declines today despite counseling on benefits - Reportedly UTD on routine HIV and Hep C screening, stated he had   Past Surgical History:  Procedure Laterality Date  . NEPHRECTOMY RADICAL Right 11/2013   R side kidney cancer surgery, non adrenal sparing     Depression screen [facility] 2/9 04/05/2016 07/16/2015 08/04/2014  Decreased Interest 0 0 0  Down, Depressed, Hopeless 0 0 0  PHQ - 2 Score 0 0 0    Social History  Substance Use Topics  . Smoking status: Current Every Day Smoker    Packs/day: 1.50    Types: Cigarettes  . Smokeless tobacco: Current User  . Alcohol use 29.4 oz/week    49 Cans of beer per week    Review of Systems Per HPI unless specifically indicated above     Objective:    BP 120/69   Pulse 81   Temp 98.6 F (37 C) (Oral)   Resp 16   Ht 5\' 10"  (1.778 m)   Wt 179 lb (81.2 kg)   SpO2 99%   BMI 25.68 kg/m   Wt Readings from Last 3 Encounters:  10/11/16 179 lb (81.2 kg)  04/05/16 178 lb (80.7 kg)  10/06/15 178  lb (80.7 kg)    Physical Exam  Constitutional: He is oriented to person, place, and time. He appears well-developed and well-nourished. No distress.  Well-appearing, comfortable, cooperative  HENT:  Head: Normocephalic and atraumatic.  Mouth/Throat: Oropharynx is clear and moist.  Eyes: Conjunctivae are normal. Right eye exhibits no discharge. Left eye exhibits no discharge.  Neck: Normal range of motion.  Cardiovascular: Normal rate, regular rhythm, normal heart sounds and intact distal pulses.   No murmur heard. Pulmonary/Chest: Effort normal and breath sounds normal. No respiratory distress. He has no wheezes. He has no rales.  Musculoskeletal: Normal range of motion. He exhibits no edema.  Neurological: He is alert and oriented to person, place, and time.  Skin: Skin is warm and dry. No rash noted. He is not diaphoretic. No erythema.  Psychiatric: He has a normal mood and affect. His behavior is normal.  Well groomed, good eye contact, normal speech and thoughts  Nursing note and vitals reviewed.  Results for orders placed or performed in visit on 10/11/16  HM HIV SCREENING LAB  Result Value Ref Range   HM HIV Screening Negative - Patient reported   HM HEPATITIS C SCREENING LAB  Result Value Ref Range   HM Hepatitis Screen Negative - Patient Reported       Assessment & Plan:  Problem List Items Addressed This Visit    Hypertension - Primary    Well controlled BP on current regimen. Concern with active smoker tobacco abuse No known complication, however patient declines labs regularly, last lab Cr 2015  Plan: 1. Continue current Losartan 100mg , Chlorthalidone 25mg , Amlodipine 10mg  daily 2. Encouraged lifestyle modifications, smoking cessation 3. Start monitor BP outside office, bring readings to next visit, if persistently >140/90 or new symptoms notify office sooner 4. Follow-up 6 months for Annual Physical - he is to check with ins / may be able to get lab from [place] in  future  Additionally reviewed my concern about his general health on these medicines if we cannot monitor his labs including kidney function, may need to discuss overall goal of therapy if continues to decline lab testing, limited ability to manage these meds if that is case      Tobacco use    Active smoker, >1ppd Not ready to quit. Declines further counseling on cessation         No orders of the defined types were placed in this encounter.   Follow up plan: Return in about 6 months (around 04/11/2017) for Annual Physical.  Future fasting labs ordered for [facility] 04/2017. May change to [facility] if patient notifies office later and needs [facility] due to insurance purposes for coverage.  Nobie Putnam, [facility] 10/11/2016, 11:05 PM

## 2016-10-11 NOTE — Assessment & Plan Note (Signed)
Active smoker, >1ppd Not ready to quit. Declines further counseling on cessation

## 2016-10-11 NOTE — Patient Instructions (Addendum)
Thank you for coming to the clinic today.  1. You have 90 day supply all meds with refills through [facility] - this was sent on 04/2016 and is good for 1 year.   Our lab is "[facility]" here at the office. If this works we can draw the blood here in the office 1 week before next visit.  DUE for FASTING BLOOD WORK (no food or drink after midnight before the lab appointment, only water or coffee without cream/sugar on the morning of)  SCHEDULE "Lab Only" visit in the morning at the clinic for lab draw in 6 MONTHS  - Make sure Lab Only appointment is at about 1 week before your next appointment, so that results will be available  For Lab Results, once available within 2-3 days of blood draw, you can can log in to MyChart online to view your results and a brief explanation. Also, we can discuss results at next follow-up visit.  Please schedule a Follow-up Appointment to: Return in about 6 months (around 04/11/2017) for Annual Physical.  If you have any other questions or concerns, please feel free to call the clinic or send a message through [place]. You may also schedule an earlier appointment if necessary.  Additionally, you may be receiving a survey about your experience at our clinic within a few days to 1 week by e-mail or mail. We value your feedback.  Nobie Putnam, DO [place]

## 2017-02-17 ENCOUNTER — Other Ambulatory Visit: Payer: Self-pay | Admitting: Family Medicine

## 2017-02-17 DIAGNOSIS — I10 Essential (primary) hypertension: Secondary | ICD-10-CM

## 2017-04-10 ENCOUNTER — Telehealth: Payer: Self-pay | Admitting: Family Medicine

## 2017-04-10 NOTE — Telephone Encounter (Signed)
Pt. Called requesting refill on amlodipine , chlorthalidone, losartan  Called in [place]he[place].  Phillip Heal

## 2017-04-10 NOTE — Telephone Encounter (Signed)
Rx were send mail order pharmacy.

## 2017-04-11 NOTE — Telephone Encounter (Signed)
Called patient unable to reach him so called mail order pharmacy his med's were shipped out yesterday.

## 2017-04-17 ENCOUNTER — Ambulatory Visit: Payer: 59 | Admitting: Family Medicine

## 2018-01-28 ENCOUNTER — Other Ambulatory Visit: Payer: Self-pay | Admitting: Family Medicine

## 2018-01-28 DIAGNOSIS — I10 Essential (primary) hypertension: Secondary | ICD-10-CM

## 2018-02-13 ENCOUNTER — Other Ambulatory Visit: Payer: Self-pay | Admitting: Family Medicine

## 2018-02-13 DIAGNOSIS — I10 Essential (primary) hypertension: Secondary | ICD-10-CM

## 2018-03-26 ENCOUNTER — Telehealth: Payer: Self-pay | Admitting: Family Medicine

## 2018-03-26 DIAGNOSIS — I10 Essential (primary) hypertension: Secondary | ICD-10-CM

## 2018-03-26 MED ORDER — AMLODIPINE BESYLATE 10 MG PO TABS: 10.0000 mg | ORAL_TABLET | Freq: Every day | ORAL | 1 refills | Status: DC

## 2018-03-26 MED ORDER — LOSARTAN POTASSIUM 100 MG PO TABS: 100.0000 mg | ORAL_TABLET | Freq: Every day | ORAL | 1 refills | Status: DC

## 2018-03-26 MED ORDER — CHLORTHALIDONE 25 MG PO TABS: 25.0000 mg | ORAL_TABLET | Freq: Every day | ORAL | 1 refills | Status: DC

## 2018-03-26 NOTE — Telephone Encounter (Signed)
Pt. wife called requesting refill on  BP medication. Wife  Call back # is [phone]

## 2018-03-26 NOTE — Telephone Encounter (Signed)
Refilled medication - sent to [facility]  Nobie Putnam, DO [facility] 03/26/2018, 8:58 AM

## 2018-08-13 ENCOUNTER — Other Ambulatory Visit: Payer: Self-pay | Admitting: Family Medicine

## 2018-08-13 DIAGNOSIS — I10 Essential (primary) hypertension: Secondary | ICD-10-CM

## 2018-08-29 ENCOUNTER — Other Ambulatory Visit: Payer: Self-pay | Admitting: Family Medicine

## 2018-08-29 DIAGNOSIS — I10 Essential (primary) hypertension: Secondary | ICD-10-CM

## 2018-08-31 ENCOUNTER — Telehealth: Payer: Self-pay

## 2018-08-31 NOTE — Telephone Encounter (Signed)
Last appt is 2018 and received many refill request left message to schedule an appointment.

## 2018-09-13 ENCOUNTER — Ambulatory Visit (INDEPENDENT_AMBULATORY_CARE_PROVIDER_SITE_OTHER): Payer: BC Managed Care – PPO | Admitting: Family Medicine

## 2018-09-13 ENCOUNTER — Other Ambulatory Visit: Payer: Self-pay

## 2018-09-13 ENCOUNTER — Encounter: Payer: Self-pay | Admitting: Family Medicine

## 2018-09-13 VITALS — BP 136/76 | HR 91 | Temp 98.7°F | Resp 16 | Ht 71.0 in | Wt 193.0 lb

## 2018-09-13 DIAGNOSIS — I10 Essential (primary) hypertension: Secondary | ICD-10-CM

## 2018-09-13 DIAGNOSIS — Z720 Tobacco use: Secondary | ICD-10-CM

## 2018-09-13 DIAGNOSIS — E663 Overweight: Secondary | ICD-10-CM | POA: Diagnosis not present

## 2018-09-13 MED ORDER — LOSARTAN POTASSIUM 100 MG PO TABS: 100.0000 mg | ORAL_TABLET | Freq: Every day | ORAL | 3 refills | Status: AC

## 2018-09-13 MED ORDER — CHLORTHALIDONE 25 MG PO TABS: 25.0000 mg | ORAL_TABLET | Freq: Every day | ORAL | 3 refills | Status: DC

## 2018-09-13 MED ORDER — AMLODIPINE BESYLATE 10 MG PO TABS: 10.0000 mg | ORAL_TABLET | Freq: Every day | ORAL | 3 refills | Status: AC

## 2018-09-13 NOTE — Progress Notes (Signed)
Subjective:    Patient ID: Kenneth Maxwell, male    DOB: [birth year], 63 y.o.   MRN: [id number]  Kenneth Maxwell is a 63 y.o. male presenting on 09/13/2018 for Hypertension   HPI  CHRONIC HTN: Reports no new concerns. His BP has been controlled, not checking regularly outside office. - Has not had recent labs done, has declined before and then did not get drawn through [place], as he had problem with bad experience with actual venipuncture before difficult stick and multiple bruises Current Meds - Chlorthalidone 25mg  daily, Amlodipine 10mg  daily, Losartan 100mg  daily Reports good compliance, took meds today. Tolerating well, w/o complaints. Lifestyle - Active, limited regular exercise Denies CP, dyspnea, HA, edema, dizziness / lightheadedness  Tobacco Abuse - Active smoke >1ppd, not ready to quit.  Health Maintenance: Due for Flu Shot, declines today despite counseling on benefits   Depression screen [facility] 2/9 09/13/2018 04/05/2016 07/16/2015  Decreased Interest 0 0 0  Down, Depressed, Hopeless 0 0 0  PHQ - 2 Score 0 0 0    Social History   Tobacco Use  . Smoking status: Current Every Day Smoker    Packs/day: 1.50    Types: Cigarettes  . Smokeless tobacco: Current User  Substance Use Topics  . Alcohol use: Yes    Alcohol/week: 49.0 standard drinks    Types: 49 Cans of beer per week  . Drug use: No    Review of Systems Per HPI unless specifically indicated above     Objective:    BP 136/76   Pulse 91   Temp 98.7 F (37.1 C) (Oral)   Resp 16   Ht 5\' 11"  (1.803 m)   Wt 193 lb (87.5 kg)   BMI 26.92 kg/m   Wt Readings from Last 3 Encounters:  09/13/18 193 lb (87.5 kg)  10/11/16 179 lb (81.2 kg)  04/05/16 178 lb (80.7 kg)    Physical Exam Vitals signs and nursing note reviewed.  Constitutional:      General: He is not in acute distress.    Appearance: He is well-developed. He is not diaphoretic.     Comments: Well-appearing, comfortable, cooperative   HENT:     Head: Normocephalic and atraumatic.  Eyes:     General:        Right eye: No discharge.        Left eye: No discharge.     Conjunctiva/sclera: Conjunctivae normal.  Neck:     Musculoskeletal: Normal range of motion and neck supple.     Thyroid: No thyromegaly.     Comments: No carotid bruits Cardiovascular:     Rate and Rhythm: Normal rate and regular rhythm.     Heart sounds: Normal heart sounds. No murmur.  Pulmonary:     Effort: Pulmonary effort is normal. No respiratory distress.     Breath sounds: Normal breath sounds. No wheezing or rales.  Musculoskeletal: Normal range of motion.  Lymphadenopathy:     Cervical: No cervical adenopathy.  Skin:    General: Skin is warm and dry.     Findings: No erythema or rash.  Neurological:     Mental Status: He is alert and oriented to person, place, and time.  Psychiatric:        Behavior: Behavior normal.     Comments: Well groomed, good eye contact, normal speech and thoughts    Results for orders placed or performed in visit on 10/11/16  HM HIV SCREENING LAB  Result Value Ref  Range   HM HIV Screening Negative - Patient reported   HM HEPATITIS C SCREENING LAB  Result Value Ref Range   HM Hepatitis Screen Negative - Patient Reported       Assessment & Plan:   Problem List Items Addressed This Visit    Hypertension - Primary    Well controlled BP on current regimen. Concern with active smoker tobacco abuse No known complication, however patient declines labs regularly, last lab Cr 2015  Plan: 1. Continue current Losartan 100mg , Chlorthalidone 25mg , Amlodipine 10mg  daily 2. Encouraged lifestyle modifications, smoking cessation 3. Continue monitor BP outside office, bring readings to next visit, if persistently >140/90 or new symptoms notify office sooner F/u 1 yr - emphasized importance about lab tests - will get next year, recommend kidney function monitoring  He understands risks of not checking blood work  routinely      Relevant Medications   amLODipine (NORVASC) 10 MG tablet   chlorthalidone (HYGROTON) 25 MG tablet   losartan (COZAAR) 100 MG tablet   Tobacco use Active smoker, not ready to quit    Other Visit Diagnoses    Overweight (BMI 25.0-29.9)      Encourage lifestyle wt loss      Meds ordered this encounter  Medications  . amLODipine (NORVASC) 10 MG tablet    Sig: Take 1 tablet (10 mg total) by mouth daily.    Dispense:  90 tablet    Refill:  3  . chlorthalidone (HYGROTON) 25 MG tablet    Sig: Take 1 tablet (25 mg total) by mouth daily.    Dispense:  90 tablet    Refill:  3  . losartan (COZAAR) 100 MG tablet    Sig: Take 1 tablet (100 mg total) by mouth daily.    Dispense:  90 tablet    Refill:  3     Follow up plan: Return in about 1 year (around 09/13/2019) for Annual Physical.  Future labs would be for [facility] when ready, he will notify us next year, he declines lab draw today.  Nobie Putnam, [facility] 09/13/2018, 8:18 AM

## 2018-09-13 NOTE — Patient Instructions (Addendum)
Thank you for coming to the office today.  Refilled medication to [facility] for 1 year  Try to cut back / quit smoking  [facility] orders in 1 year. Let us know when ready to get blood drawn and we can send them an order.  DUE for FASTING BLOOD WORK (no food or drink after midnight before the lab appointment, only water or coffee without cream/sugar on the morning of)  - Make sure Lab Only appointment is at about 1 week before your next appointment, so that results will be available  For Lab Results, once available within 2-3 days of blood draw, you can can log in to MyChart online to view your results and a brief explanation. Also, we can discuss results at next follow-up visit.   Please schedule a Follow-up Appointment to: Return in about 1 year (around 09/13/2019) for Annual Physical.  If you have any other questions or concerns, please feel free to call the office or send a message through [place]. You may also schedule an earlier appointment if necessary.  Additionally, you may be receiving a survey about your experience at our office within a few days to 1 week by e-mail or mail. We value your feedback.  Nobie Putnam, DO [place]

## 2018-09-13 NOTE — Assessment & Plan Note (Signed)
Well controlled BP on current regimen. Concern with active smoker tobacco abuse No known complication, however patient declines labs regularly, last lab Cr 2015  Plan: 1. Continue current Losartan 100mg , Chlorthalidone 25mg , Amlodipine 10mg  daily 2. Encouraged lifestyle modifications, smoking cessation 3. Continue monitor BP outside office, bring readings to next visit, if persistently >140/90 or new symptoms notify office sooner F/u 1 yr - emphasized importance about lab tests - will get next year, recommend kidney function monitoring

## 2019-04-04 DIAGNOSIS — C787 Secondary malignant neoplasm of liver and intrahepatic bile duct: Secondary | ICD-10-CM

## 2019-04-04 DIAGNOSIS — C259 Malignant neoplasm of pancreas, unspecified: Secondary | ICD-10-CM

## 2019-04-04 HISTORY — DX: Secondary malignant neoplasm of liver and intrahepatic bile duct: C78.7

## 2019-04-04 HISTORY — DX: Malignant neoplasm of pancreas, unspecified: C25.9

## 2019-04-24 ENCOUNTER — Other Ambulatory Visit: Payer: Self-pay

## 2019-04-24 ENCOUNTER — Ambulatory Visit: Payer: BC Managed Care – PPO | Admitting: Family Medicine

## 2019-04-24 ENCOUNTER — Encounter: Payer: Self-pay | Admitting: Family Medicine

## 2019-04-24 VITALS — BP 107/62 | HR 71 | Temp 97.5°F | Resp 16 | Ht 71.0 in | Wt 179.0 lb

## 2019-04-24 DIAGNOSIS — R17 Unspecified jaundice: Secondary | ICD-10-CM

## 2019-04-24 DIAGNOSIS — R82998 Other abnormal findings in urine: Secondary | ICD-10-CM

## 2019-04-24 DIAGNOSIS — F40240 Claustrophobia: Secondary | ICD-10-CM

## 2019-04-24 DIAGNOSIS — R310 Gross hematuria: Secondary | ICD-10-CM | POA: Diagnosis not present

## 2019-04-24 DIAGNOSIS — R1010 Upper abdominal pain, unspecified: Secondary | ICD-10-CM

## 2019-04-24 DIAGNOSIS — F410 Panic disorder [episodic paroxysmal anxiety] without agoraphobia: Secondary | ICD-10-CM

## 2019-04-24 MED ORDER — LORAZEPAM 0.5 MG PO TABS: ORAL_TABLET | ORAL | 0 refills | Status: DC

## 2019-04-24 NOTE — Patient Instructions (Addendum)
Thank you for coming to the office today.  Check urine sample today will send out to lab and also for culture for possible infection  Most likely the dark urine is either blood or can be a byproduct of liver  Check eyes, they appear to have some slight yellow tint - this can be liver related.  Labs ordered for blood counts, liver and kidney and hepatitis panel - stay tuned for results.  May refer to GI liver specialist or can refer back to Urology Dr Erlene Quan  If worsening symptoms - or if new abdominal pain, nausea vomiting, fever chills, confusion  Please schedule a Follow-up Appointment to: Return in about 2 weeks (around 05/08/2019), or if symptoms worsen or fail to improve, for dark urine .  If you have any other questions or concerns, please feel free to call the office or send a message through [place]. You may also schedule an earlier appointment if necessary.  Additionally, you may be receiving a survey about your experience at our office within a few days to 1 week by e-mail or mail. We value your feedback.  Nobie Putnam, DO [place]

## 2019-04-24 NOTE — Progress Notes (Signed)
Subjective:    Patient ID: Kenneth Maxwell, male    DOB: 09/03/1955, 64 y.o.   MRN: [id number]  Kenneth Maxwell is a 64 y.o. male presenting on 04/24/2019 for dark urination (Hx of kidney cancer, intermitten lower abdominal pain) and Diarrhea (onset 3 weeks denies nausea or dizziness but was concerned about dehydration )   HPI   Gross Hematuria / Darker Urine Reports onset symptoms started about 3 weeks ago, with now has had darker urine - possibly blood in urine, seems to be every urination now since it started. He said at first when it started happening, he stopped drinking soft drinks and alcohol / beer/liquor - He does not have any dysuria or pain with urination. He denies any significant urinary urgency or frequency  No recent blood work in past >6 years  History of R Kidney Cancer Renal Cell Carcinoma  - previously managed by [facility], s/p surgery 2015 - Last CT Abdomen 06/30/14 - normal liver, no recurrence at that time. He has history of gross hematuria in the past. Last follow up in 2018.  Additionally admits upset stomach some diarrhea at times. Denies any nausea vomiting, dyspnea, chest pain  Depression screen [facility] 2/9 04/24/2019 09/13/2018 04/05/2016  Decreased Interest 0 0 0  Down, Depressed, Hopeless 0 0 0  PHQ - 2 Score 0 0 0    Social History   Tobacco Use  . Smoking status: Current Every Day Smoker    Packs/day: 1.50    Types: Cigarettes  . Smokeless tobacco: Current User  Substance Use Topics  . Alcohol use: Yes    Alcohol/week: 49.0 standard drinks    Types: 49 Cans of beer per week  . Drug use: No    Review of Systems Per HPI unless specifically indicated above     Objective:    BP 107/62   Pulse 71   Temp (!) 97.5 F (36.4 C) (Temporal)   Resp 16   Ht 5\' 11"  (1.803 m)   Wt 179 lb (81.2 kg)   SpO2 97%   BMI 24.97 kg/m   Wt Readings from Last 3 Encounters:  04/24/19 179 lb (81.2 kg)  09/13/18 193 lb (87.5 kg)  10/11/16 179 lb (81.2 kg)     Physical Exam Vitals and nursing note reviewed.  Constitutional:      General: He is not in acute distress.    Appearance: He is well-developed. He is not diaphoretic.     Comments: Well-appearing, comfortable, cooperative  HENT:     Head: Normocephalic and atraumatic.  Eyes:     General: Scleral icterus (moderate, bilateral) present.        Right eye: No discharge.        Left eye: No discharge.     Conjunctiva/sclera: Conjunctivae normal.  Neck:     Thyroid: No thyromegaly.  Cardiovascular:     Rate and Rhythm: Normal rate and regular rhythm.     Heart sounds: Normal heart sounds. No murmur.  Pulmonary:     Effort: Pulmonary effort is normal. No respiratory distress.     Breath sounds: Normal breath sounds. No wheezing or rales.  Abdominal:     General: Bowel sounds are normal. There is distension (mild soft distention generalized).     Palpations: Abdomen is soft. There is no mass.     Tenderness: There is no guarding or rebound.     Comments: No hepatosplenomegaly on exam.  Musculoskeletal:  General: Normal range of motion.     Cervical back: Normal range of motion and neck supple.  Lymphadenopathy:     Cervical: No cervical adenopathy.  Skin:    General: Skin is warm and dry.     Findings: No erythema or rash.  Neurological:     Mental Status: He is alert and oriented to person, place, and time.  Psychiatric:        Behavior: Behavior normal.     Comments: Well groomed, good eye contact, normal speech and thoughts    Results for orders placed or performed in visit on 10/11/16  HM HIV SCREENING LAB  Result Value Ref Range   HM HIV Screening Negative - Patient reported   HM HEPATITIS C SCREENING LAB  Result Value Ref Range   HM Hepatitis Screen Negative - Patient Reported       Assessment & Plan:   Problem List Items Addressed This Visit    None    Visit Diagnoses    Dark urine    -  Primary   Relevant Orders   CBC with Differential/Platelet    COMPLETE METABOLIC PANEL WITH GFR   Urinalysis, Routine w reflex microscopic   Urine Culture   Gross hematuria       Relevant Orders   CBC with Differential/Platelet   Urinalysis, Routine w reflex microscopic   Urine Culture   Scleral icterus       Relevant Orders   CBC with Differential/Platelet   COMPLETE METABOLIC PANEL WITH GFR   Hepatitis, Acute   Claustrophobia       Relevant Medications   LORazepam (ATIVAN) 0.5 MG tablet   Panic attack       Relevant Medications   LORazepam (ATIVAN) 0.5 MG tablet   Pain of upper abdomen       Relevant Orders   Hepatitis, Acute      #Gross Hematuria vs Dark Urine Uncertain exact etiology, some constellation of symptoms. Question if can be recurrence of hematuria - or possibly may have possible bilirubinuria given description and identified scleral icterus on exam. He has history of alcohol use.   Prior history of kidney cancer R sided RCC s/p nephrectomy by [facility]  Will check Urinalysis / microscopy and Urine Culture today and send out to [facility] lab - however symptoms not entirely consistent with UTI will rule it out.  He can return tomorrow for [facility] for labs CMET CBC + Hepatitis acute panel and Fract Bili, will add the additional blood work due to concerns, and he has high anxiety panic with blood draw prefers limited lab testing.  Will offer Ativan dose for pre-blood draw.  Follow-up results. Consider referral back to [facility] if determined hematuria or we can refer to GI if indicated based on initial work up.  Orders Placed This Encounter  Procedures  . Urine Culture  . Urinalysis, Routine w reflex microscopic  . CBC with Differential/Platelet    Standing Status:   Future    Standing Expiration Date:   07/04/2019  . COMPLETE METABOLIC PANEL WITH GFR    Standing Status:   Future    Standing Expiration Date:   07/04/2019  . Hepatitis, Acute    Standing Status:   Future    Standing Expiration Date:   07/04/2019  .  Bilirubin, fractionated(tot/dir/indir)    Standing Status:   Future    Standing Expiration Date:   07/04/2019     Meds ordered this encounter  Medications  .  LORazepam (ATIVAN) 0.5 MG tablet    Sig: Take 1-2 tablets 30-60 minutes prior to blood draw. Can take additional tab if needed at time of test. Must have driver to and from test. Can cause sedation.    Dispense:  3 tablet    Refill:  0      Follow up plan: Return in about 2 weeks (around 05/08/2019), or if symptoms worsen or fail to improve, for dark urine .   Nobie Putnam, [facility] 04/24/2019, 2:28 PM

## 2019-04-25 DIAGNOSIS — R310 Gross hematuria: Secondary | ICD-10-CM | POA: Diagnosis not present

## 2019-04-25 DIAGNOSIS — R17 Unspecified jaundice: Secondary | ICD-10-CM | POA: Diagnosis not present

## 2019-04-25 DIAGNOSIS — R82998 Other abnormal findings in urine: Secondary | ICD-10-CM | POA: Diagnosis not present

## 2019-04-25 LAB — URINALYSIS, ROUTINE W REFLEX MICROSCOPIC
Bacteria, UA: NONE SEEN /HPF
Glucose, UA: NEGATIVE
Hgb urine dipstick: NEGATIVE
Hyaline Cast: NONE SEEN /LPF
Ketones, ur: NEGATIVE
Nitrite: NEGATIVE
Specific Gravity, Urine: 1.01 (ref 1.001–1.03)
Squamous Epithelial / HPF: NONE SEEN /HPF (ref <=5)
WBC, UA: NONE SEEN /HPF (ref 0–5)
pH: 7 (ref 5.0–8.0)

## 2019-04-25 LAB — URINE CULTURE
MICRO NUMBER:: 10389794
Result:: NO GROWTH
SPECIMEN QUALITY:: ADEQUATE

## 2019-04-26 ENCOUNTER — Inpatient Hospital Stay: Payer: BC Managed Care – PPO

## 2019-04-26 ENCOUNTER — Emergency Department: Payer: BC Managed Care – PPO

## 2019-04-26 ENCOUNTER — Other Ambulatory Visit: Payer: Self-pay

## 2019-04-26 ENCOUNTER — Telehealth: Payer: Self-pay

## 2019-04-26 ENCOUNTER — Inpatient Hospital Stay
Admission: EM | Admit: 2019-04-26 | Discharge: 2019-04-29 | DRG: 445 | Disposition: A | Discharge status: Home / Self Care | Payer: BC Managed Care – PPO | Attending: Student | Admitting: Student

## 2019-04-26 DIAGNOSIS — Z03818 Encounter for observation for suspected exposure to other biological agents ruled out: Secondary | ICD-10-CM | POA: Diagnosis not present

## 2019-04-26 DIAGNOSIS — K831 Obstruction of bile duct: Principal | ICD-10-CM | POA: Diagnosis present

## 2019-04-26 DIAGNOSIS — Z720 Tobacco use: Secondary | ICD-10-CM | POA: Diagnosis not present

## 2019-04-26 DIAGNOSIS — C259 Malignant neoplasm of pancreas, unspecified: Secondary | ICD-10-CM | POA: Diagnosis not present

## 2019-04-26 DIAGNOSIS — K3189 Other diseases of stomach and duodenum: Secondary | ICD-10-CM | POA: Diagnosis not present

## 2019-04-26 DIAGNOSIS — T502X5A Adverse effect of carbonic-anhydrase inhibitors, benzothiadiazides and other diuretics, initial encounter: Secondary | ICD-10-CM | POA: Diagnosis present

## 2019-04-26 DIAGNOSIS — Z8249 Family history of ischemic heart disease and other diseases of the circulatory system: Secondary | ICD-10-CM | POA: Diagnosis not present

## 2019-04-26 DIAGNOSIS — R748 Abnormal levels of other serum enzymes: Secondary | ICD-10-CM | POA: Diagnosis not present

## 2019-04-26 DIAGNOSIS — K759 Inflammatory liver disease, unspecified: Secondary | ICD-10-CM | POA: Diagnosis not present

## 2019-04-26 DIAGNOSIS — D72829 Elevated white blood cell count, unspecified: Secondary | ICD-10-CM | POA: Diagnosis present

## 2019-04-26 DIAGNOSIS — K838 Other specified diseases of biliary tract: Secondary | ICD-10-CM | POA: Diagnosis not present

## 2019-04-26 DIAGNOSIS — D72825 Bandemia: Secondary | ICD-10-CM | POA: Diagnosis present

## 2019-04-26 DIAGNOSIS — R935 Abnormal findings on diagnostic imaging of other abdominal regions, including retroperitoneum: Secondary | ICD-10-CM | POA: Diagnosis not present

## 2019-04-26 DIAGNOSIS — E876 Hypokalemia: Secondary | ICD-10-CM | POA: Diagnosis not present

## 2019-04-26 DIAGNOSIS — C787 Secondary malignant neoplasm of liver and intrahepatic bile duct: Secondary | ICD-10-CM | POA: Diagnosis not present

## 2019-04-26 DIAGNOSIS — C250 Malignant neoplasm of head of pancreas: Secondary | ICD-10-CM | POA: Diagnosis not present

## 2019-04-26 DIAGNOSIS — F1010 Alcohol abuse, uncomplicated: Secondary | ICD-10-CM | POA: Diagnosis present

## 2019-04-26 DIAGNOSIS — K7689 Other specified diseases of liver: Secondary | ICD-10-CM | POA: Diagnosis not present

## 2019-04-26 DIAGNOSIS — Z905 Acquired absence of kidney: Secondary | ICD-10-CM

## 2019-04-26 DIAGNOSIS — R197 Diarrhea, unspecified: Secondary | ICD-10-CM | POA: Diagnosis not present

## 2019-04-26 DIAGNOSIS — K8689 Other specified diseases of pancreas: Secondary | ICD-10-CM

## 2019-04-26 DIAGNOSIS — R945 Abnormal results of liver function studies: Secondary | ICD-10-CM | POA: Diagnosis not present

## 2019-04-26 DIAGNOSIS — K8020 Calculus of gallbladder without cholecystitis without obstruction: Secondary | ICD-10-CM | POA: Diagnosis present

## 2019-04-26 DIAGNOSIS — Z885 Allergy status to narcotic agent status: Secondary | ICD-10-CM

## 2019-04-26 DIAGNOSIS — R7989 Other specified abnormal findings of blood chemistry: Secondary | ICD-10-CM | POA: Diagnosis present

## 2019-04-26 DIAGNOSIS — I10 Essential (primary) hypertension: Secondary | ICD-10-CM

## 2019-04-26 DIAGNOSIS — Z20822 Contact with and (suspected) exposure to covid-19: Secondary | ICD-10-CM | POA: Diagnosis not present

## 2019-04-26 DIAGNOSIS — Z1389 Encounter for screening for other disorder: Secondary | ICD-10-CM

## 2019-04-26 DIAGNOSIS — C801 Malignant (primary) neoplasm, unspecified: Secondary | ICD-10-CM | POA: Diagnosis not present

## 2019-04-26 DIAGNOSIS — F17210 Nicotine dependence, cigarettes, uncomplicated: Secondary | ICD-10-CM | POA: Diagnosis not present

## 2019-04-26 DIAGNOSIS — Z85528 Personal history of other malignant neoplasm of kidney: Secondary | ICD-10-CM

## 2019-04-26 DIAGNOSIS — K828 Other specified diseases of gallbladder: Secondary | ICD-10-CM | POA: Diagnosis not present

## 2019-04-26 DIAGNOSIS — Z833 Family history of diabetes mellitus: Secondary | ICD-10-CM | POA: Diagnosis not present

## 2019-04-26 DIAGNOSIS — F419 Anxiety disorder, unspecified: Secondary | ICD-10-CM | POA: Diagnosis not present

## 2019-04-26 DIAGNOSIS — J439 Emphysema, unspecified: Secondary | ICD-10-CM | POA: Diagnosis not present

## 2019-04-26 DIAGNOSIS — Z01818 Encounter for other preprocedural examination: Secondary | ICD-10-CM | POA: Diagnosis not present

## 2019-04-26 DIAGNOSIS — D490 Neoplasm of unspecified behavior of digestive system: Secondary | ICD-10-CM

## 2019-04-26 DIAGNOSIS — K8681 Exocrine pancreatic insufficiency: Secondary | ICD-10-CM | POA: Diagnosis present

## 2019-04-26 DIAGNOSIS — K769 Liver disease, unspecified: Secondary | ICD-10-CM | POA: Diagnosis not present

## 2019-04-26 DIAGNOSIS — E871 Hypo-osmolality and hyponatremia: Secondary | ICD-10-CM | POA: Diagnosis not present

## 2019-04-26 DIAGNOSIS — Z79899 Other long term (current) drug therapy: Secondary | ICD-10-CM | POA: Diagnosis not present

## 2019-04-26 LAB — BASIC METABOLIC PANEL
Anion gap: 13 (ref 5–15)
BUN: 18 mg/dL (ref 8–23)
CO2: 25 mmol/L (ref 22–32)
Calcium: 9.7 mg/dL (ref 8.9–10.3)
Chloride: 94 mmol/L — ABNORMAL LOW (ref 98–111)
Creatinine, Ser: 0.93 mg/dL (ref 0.61–1.24)
GFR calc Af Amer: >60 mL/min (ref >60)
GFR calc non Af Amer: >60 mL/min (ref >60)
Glucose, Bld: 118 mg/dL — ABNORMAL HIGH (ref 70–99)
Potassium: <2 mmol/L — CL (ref 3.5–5.1)
Sodium: 132 mmol/L — ABNORMAL LOW (ref 135–145)

## 2019-04-26 LAB — URINALYSIS, COMPLETE (UACMP) WITH MICROSCOPIC
Bacteria, UA: NONE SEEN
Glucose, UA: NEGATIVE mg/dL
Hgb urine dipstick: NEGATIVE
Ketones, ur: NEGATIVE mg/dL
Leukocytes,Ua: NEGATIVE
Nitrite: NEGATIVE
Protein, ur: NEGATIVE mg/dL
Specific Gravity, Urine: 1.01 (ref 1.005–1.030)
Squamous Epithelial / HPF: NONE SEEN (ref 0–5)
pH: 7 (ref 5.0–8.0)

## 2019-04-26 LAB — CBC WITH DIFFERENTIAL/PLATELET
Abs Immature Granulocytes: 0.15 10*3/uL — ABNORMAL HIGH (ref 0.00–0.07)
Absolute Monocytes: 1190 cells/uL — ABNORMAL HIGH (ref 200–950)
Basophils Absolute: 128 cells/uL (ref 0–200)
Basophils Absolute: 0.2 10*3/uL — ABNORMAL HIGH (ref 0.0–0.1)
Basophils Relative: 1 %
Basophils Relative: 1 %
Eosinophils Absolute: 461 cells/uL (ref 15–500)
Eosinophils Absolute: 0.4 10*3/uL (ref 0.0–0.5)
Eosinophils Relative: 3.6 %
Eosinophils Relative: 3 %
HCT: 48.9 % (ref 38.5–50.0)
HCT: 45.4 % (ref 39.0–52.0)
Hemoglobin: 17.5 g/dL — ABNORMAL HIGH (ref 13.2–17.1)
Hemoglobin: 17 g/dL (ref 13.0–17.0)
Immature Granulocytes: 1 %
Lymphocytes Relative: 9 %
Lymphs Abs: 1331 cells/uL (ref 850–3900)
Lymphs Abs: 1.2 10*3/uL (ref 0.7–4.0)
MCH: 31.7 pg (ref 27.0–33.0)
MCH: 31.1 pg (ref 26.0–34.0)
MCHC: 35.8 g/dL (ref 32.0–36.0)
MCHC: 37.4 g/dL — ABNORMAL HIGH (ref 30.0–36.0)
MCV: 88.6 fL (ref 80.0–100.0)
MCV: 83 fL (ref 80.0–100.0)
MPV: 13.1 fL — ABNORMAL HIGH (ref 7.5–12.5)
Monocytes Absolute: 1.4 10*3/uL — ABNORMAL HIGH (ref 0.1–1.0)
Monocytes Relative: 9.3 %
Monocytes Relative: 10 %
Neutro Abs: 9690 cells/uL — ABNORMAL HIGH (ref 1500–7800)
Neutro Abs: 10.8 10*3/uL — ABNORMAL HIGH (ref 1.7–7.7)
Neutrophils Relative %: 75.7 %
Neutrophils Relative %: 76 %
Platelets: 284 10*3/uL (ref 140–400)
Platelets: 267 10*3/uL (ref 150–400)
RBC: 5.52 10*6/uL (ref 4.20–5.80)
RBC: 5.47 MIL/uL (ref 4.22–5.81)
RDW: 14.4 % (ref 11.0–15.0)
RDW: 15.9 % — ABNORMAL HIGH (ref 11.5–15.5)
Total Lymphocyte: 10.4 %
WBC: 12.8 10*3/uL — ABNORMAL HIGH (ref 3.8–10.8)
WBC: 14.1 10*3/uL — ABNORMAL HIGH (ref 4.0–10.5)
nRBC: 0 % (ref 0.0–0.2)

## 2019-04-26 LAB — HEPATIC FUNCTION PANEL
ALT: 568 U/L — ABNORMAL HIGH (ref 0–44)
AST: 226 U/L — ABNORMAL HIGH (ref 15–41)
Albumin: 3.6 g/dL (ref 3.5–5.0)
Alkaline Phosphatase: 600 U/L — ABNORMAL HIGH (ref 38–126)
Bilirubin, Direct: 13.7 mg/dL — ABNORMAL HIGH (ref 0.0–0.2)
Indirect Bilirubin: 8 mg/dL — ABNORMAL HIGH (ref 0.3–0.9)
Total Bilirubin: 21.7 mg/dL (ref 0.3–1.2)
Total Protein: 7.8 g/dL (ref 6.5–8.1)

## 2019-04-26 LAB — COMPLETE METABOLIC PANEL WITH GFR
AG Ratio: 1.6 (calc) (ref 1.0–2.5)
ALT: 537 U/L — ABNORMAL HIGH (ref 9–46)
AST: 236 U/L — ABNORMAL HIGH (ref 10–35)
Albumin: 3.9 g/dL (ref 3.6–5.1)
Alkaline phosphatase (APISO): 591 U/L — ABNORMAL HIGH (ref 35–144)
BUN/Creatinine Ratio: 12 (calc) (ref 6–22)
BUN: 17 mg/dL (ref 7–25)
CO2: 27 mmol/L (ref 20–32)
Calcium: 9.7 mg/dL (ref 8.6–10.3)
Chloride: 95 mmol/L — ABNORMAL LOW (ref 98–110)
Creat: 1.38 mg/dL — ABNORMAL HIGH (ref 0.70–1.25)
GFR, Est African American: 63 mL/min/{1.73_m2} (ref >=60)
GFR, Est Non African American: 55 mL/min/{1.73_m2} — ABNORMAL LOW (ref >=60)
Globulin: 2.5 g/dL (calc) (ref 1.9–3.7)
Glucose, Bld: 102 mg/dL (ref 65–139)
Potassium: 2.4 mmol/L — CL (ref 3.5–5.3)
Sodium: 135 mmol/L (ref 135–146)
Total Bilirubin: 18.2 mg/dL — ABNORMAL HIGH (ref 0.2–1.2)
Total Protein: 6.4 g/dL (ref 6.1–8.1)

## 2019-04-26 LAB — RESPIRATORY PANEL BY RT PCR (FLU A&B, COVID)
Influenza A by PCR: NEGATIVE
Influenza B by PCR: NEGATIVE
SARS Coronavirus 2 by RT PCR: NEGATIVE

## 2019-04-26 LAB — PHOSPHORUS: Phosphorus: UNDETERMINED mg/dL (ref 2.5–4.6)

## 2019-04-26 LAB — HEPATITIS PANEL, ACUTE
HCV Ab: NONREACTIVE
Hep A IgM: NONREACTIVE
Hep A IgM: NONREACTIVE
Hep B C IgM: NONREACTIVE
Hep B C IgM: NONREACTIVE
Hepatitis B Surface Ag: NONREACTIVE
Hepatitis B Surface Ag: NONREACTIVE
Hepatitis C Ab: NONREACTIVE
SIGNAL TO CUT-OFF: 0.01 (ref <1.00)

## 2019-04-26 LAB — APTT: aPTT: <24 seconds — ABNORMAL LOW (ref 24–36)

## 2019-04-26 LAB — PROTIME-INR
INR: 0.9 (ref 0.8–1.2)
Prothrombin Time: 12.2 seconds (ref 11.4–15.2)

## 2019-04-26 LAB — MAGNESIUM: Magnesium: 2.4 mg/dL (ref 1.7–2.4)

## 2019-04-26 LAB — LIPASE, BLOOD: Lipase: 44 U/L (ref 11–51)

## 2019-04-26 LAB — AMMONIA: Ammonia: UNDETERMINED umol/L (ref 9–35)

## 2019-04-26 LAB — ACETAMINOPHEN LEVEL: Acetaminophen (Tylenol), Serum: <10 ug/mL — ABNORMAL LOW (ref 10–30)

## 2019-04-26 MED ORDER — LORAZEPAM 0.5 MG PO TABS: 0.5000 mg | ORAL_TABLET | ORAL | Status: DC | PRN

## 2019-04-26 MED ORDER — LORAZEPAM 2 MG/ML IJ SOLN: 1.0000 mg | INTRAMUSCULAR | Status: DC | PRN

## 2019-04-26 MED ORDER — LORAZEPAM 2 MG/ML IJ SOLN: 0.0000 mg | Freq: Two times a day (BID) | INTRAMUSCULAR | Status: DC

## 2019-04-26 MED ORDER — THIAMINE HCL 100 MG/ML IJ SOLN
100.0000 mg | Freq: Every day | INTRAMUSCULAR | Status: DC
  Administered 2019-04-27 – 2019-04-28 (×2): 100 mg via INTRAVENOUS
  Filled 2019-04-26 (×2): qty 2

## 2019-04-26 MED ORDER — SODIUM CHLORIDE 0.9 % IV SOLN: INTRAVENOUS | Status: DC

## 2019-04-26 MED ORDER — LORAZEPAM 2 MG/ML IJ SOLN: 0.0000 mg | Freq: Four times a day (QID) | INTRAMUSCULAR | Status: DC

## 2019-04-26 MED ORDER — LORAZEPAM 2 MG/ML IJ SOLN: 1.0000 mg | INTRAMUSCULAR | Status: AC | PRN

## 2019-04-26 MED ORDER — ONDANSETRON HCL 4 MG/2ML IJ SOLN: 4.0000 mg | Freq: Three times a day (TID) | INTRAMUSCULAR | Status: DC | PRN

## 2019-04-26 MED ORDER — POTASSIUM CHLORIDE 10 MEQ/100ML IV SOLN
10.0000 meq | INTRAVENOUS | Status: AC
  Administered 2019-04-26 (×4): 10 meq via INTRAVENOUS
  Filled 2019-04-26 (×4): qty 100

## 2019-04-26 MED ORDER — POTASSIUM CHLORIDE CRYS ER 20 MEQ PO TBCR
40.0000 meq | EXTENDED_RELEASE_TABLET | Freq: Once | ORAL | Status: AC
  Administered 2019-04-26: 14:00:00 40 meq via ORAL
  Filled 2019-04-26: qty 2

## 2019-04-26 MED ORDER — ALBUTEROL SULFATE (2.5 MG/3ML) 0.083% IN NEBU: 2.5000 mg | INHALATION_SOLUTION | RESPIRATORY_TRACT | Status: DC | PRN

## 2019-04-26 MED ORDER — POTASSIUM CHLORIDE CRYS ER 20 MEQ PO TBCR
40.0000 meq | EXTENDED_RELEASE_TABLET | Freq: Once | ORAL | Status: AC
  Administered 2019-04-26: 40 meq via ORAL
  Filled 2019-04-26: qty 2

## 2019-04-26 MED ORDER — FOLIC ACID 1 MG PO TABS
1.0000 mg | ORAL_TABLET | Freq: Every day | ORAL | Status: DC
  Administered 2019-04-26 – 2019-04-29 (×3): 1 mg via ORAL
  Filled 2019-04-26 (×3): qty 1

## 2019-04-26 MED ORDER — LORAZEPAM 2 MG/ML IJ SOLN: 0.5000 mg | INTRAMUSCULAR | Status: DC | PRN

## 2019-04-26 MED ORDER — LORAZEPAM 2 MG/ML IJ SOLN
1.0000 mg | Freq: Once | INTRAMUSCULAR | Status: AC
  Administered 2019-04-26: 1 mg via INTRAVENOUS
  Filled 2019-04-26: qty 1

## 2019-04-26 MED ORDER — GADOBUTROL 1 MMOL/ML IV SOLN
7.5000 mL | Freq: Once | INTRAVENOUS | Status: AC | PRN
  Administered 2019-04-26: 17:00:00 7.5 mL via INTRAVENOUS

## 2019-04-26 MED ORDER — HYDRALAZINE HCL 20 MG/ML IJ SOLN: 5.0000 mg | INTRAMUSCULAR | Status: DC | PRN

## 2019-04-26 MED ORDER — NICOTINE 21 MG/24HR TD PT24
21.0000 mg | MEDICATED_PATCH | Freq: Every day | TRANSDERMAL | Status: DC
  Administered 2019-04-26 – 2019-04-29 (×4): 21 mg via TRANSDERMAL
  Filled 2019-04-26 (×4): qty 1

## 2019-04-26 MED ORDER — THIAMINE HCL 100 MG PO TABS
100.0000 mg | ORAL_TABLET | Freq: Every day | ORAL | Status: DC
  Administered 2019-04-26 – 2019-04-29 (×2): 100 mg via ORAL
  Filled 2019-04-26 (×2): qty 1

## 2019-04-26 MED ORDER — ADULT MULTIVITAMIN W/MINERALS CH
1.0000 | ORAL_TABLET | Freq: Every day | ORAL | Status: DC
  Administered 2019-04-26 – 2019-04-29 (×2): 1 via ORAL
  Filled 2019-04-26 (×2): qty 1

## 2019-04-26 MED ORDER — LORAZEPAM 1 MG PO TABS
1.0000 mg | ORAL_TABLET | Freq: Once | ORAL | Status: AC
  Administered 2019-04-26: 1 mg via ORAL
  Filled 2019-04-26: qty 1

## 2019-04-26 MED ORDER — ALBUTEROL SULFATE HFA 108 (90 BASE) MCG/ACT IN AERS: 2.0000 | INHALATION_SPRAY | RESPIRATORY_TRACT | Status: DC | PRN

## 2019-04-26 MED ORDER — MAGNESIUM SULFATE 2 GM/50ML IV SOLN
2.0000 g | Freq: Once | INTRAVENOUS | Status: AC
  Administered 2019-04-26: 12:00:00 2 g via INTRAVENOUS
  Filled 2019-04-26: qty 50

## 2019-04-26 MED ORDER — LORAZEPAM 1 MG PO TABS: 1.0000 mg | ORAL_TABLET | ORAL | Status: DC | PRN

## 2019-04-26 MED ORDER — DM-GUAIFENESIN ER 30-600 MG PO TB12: 1.0000 | ORAL_TABLET | Freq: Two times a day (BID) | ORAL | Status: DC | PRN

## 2019-04-26 MED ORDER — AMLODIPINE BESYLATE 10 MG PO TABS
10.0000 mg | ORAL_TABLET | Freq: Every day | ORAL | Status: DC
  Administered 2019-04-27 – 2019-04-29 (×3): 10 mg via ORAL
  Filled 2019-04-26 (×3): qty 1

## 2019-04-26 MED ORDER — LOSARTAN POTASSIUM 50 MG PO TABS
100.0000 mg | ORAL_TABLET | Freq: Every day | ORAL | Status: DC
  Administered 2019-04-27 – 2019-04-29 (×3): 100 mg via ORAL
  Filled 2019-04-26 (×3): qty 2

## 2019-04-26 NOTE — Telephone Encounter (Signed)
See result note. I have called patient around 8am today and advised him to go to [facility] ED already. He is currently at [facility] ED being evaluated and treated.  Nobie Putnam, [facility] 04/26/2019, 12:08 PM

## 2019-04-26 NOTE — ED Notes (Signed)
Date and time results received: 04/26/19 12:08 PM  Test: Potassium/Bilirubin Critical Value: <2.0/21.7  Name of Provider Notified: Charna Archer

## 2019-04-26 NOTE — ED Triage Notes (Signed)
Pt states he went to see his PCP Wednesday for dark colored urine and was concerned due to having kidney ca with nephrectomy in November. States his labs came back with abnormal liver function. Pt is in NAD.

## 2019-04-26 NOTE — ED Provider Notes (Signed)
[facility] Emergency Department Provider Note   ____________________________________________   First MD Initiated Contact with Patient 04/26/19 0915     (approximate)  I have reviewed the triage vital signs and the nursing notes.   HISTORY  Chief Complaint Abnormal Lab    HPI Kenneth Maxwell is a 64 y.o. male with possible history of RCC status post right nephrectomy, hypertension, and alcohol abuse who presents to the ED complaining of abnormal labs.  Patient reports that he has noticed very dark urine over the past 3 weeks and has become increasingly fatigued.  He saw his PCP for this problem 2 days ago, who noted that his sclera and skin were yellow, afterwards lab work was concerning for markedly elevated bilirubin as well as hypokalemia.  He was referred to the ED for further evaluation.  Patient denies any abdominal pain, but states that his abdomen has felt very full for least a week now.  He has been making urine as normal, denies any dysuria or hematuria.  He admits to significant alcohol consumption in the past, but states he quit drinking entirely about 1 month ago.  He previously had right nephrectomy for renal cell carcinoma in 2015, but states he has not been following with oncology or urology.        Past Medical History:  Diagnosis Date  . Alcohol abuse   . Arthritis   . Blood clotting disorder ([place])   . Chronic renal disease   . H/O urinary retention   . Hematuria, gross   . Hypertension   . Renal cell carcinoma ([place]) 11/2013   kidney cancer s/p R nephrectomy  . Right renal mass     Patient Active Problem List   Diagnosis Date Noted  . Hypokalemia 04/26/2019  . Abnormal LFTs 04/26/2019  . Leukocytosis 04/26/2019  . Diarrhea 04/26/2019  . Liver disease   . S/p nephrectomy 10/11/2016  . Arthritis 08/04/2014  . History of renal cell carcinoma 08/04/2014  . H/O cardiovascular disorder 08/04/2014  . Compulsive tobacco user  syndrome 08/04/2014  . Hypertension 06/25/2014  . Tobacco use 06/25/2014  . Alcohol abuse 06/25/2014    Past Surgical History:  Procedure Laterality Date  . NEPHRECTOMY RADICAL Right 11/2013   R side kidney cancer surgery, non adrenal sparing    Prior to Admission medications   Medication Sig Start Date End Date Taking? Authorizing Provider  amLODipine (NORVASC) 10 MG tablet Take 1 tablet (10 mg total) by mouth daily. 09/13/18  Yes Karamalegos, Devonne Doughty, DO  chlorthalidone (HYGROTON) 25 MG tablet Take 1 tablet (25 mg total) by mouth daily. 09/13/18  Yes Karamalegos, Devonne Doughty, DO  LORazepam (ATIVAN) 0.5 MG tablet Take 1-2 tablets 30-60 minutes prior to blood draw. Can take additional tab if needed at time of test. Must have driver to and from test. Can cause sedation. 04/24/19  Yes Karamalegos, Devonne Doughty, DO  losartan (COZAAR) 100 MG tablet Take 1 tablet (100 mg total) by mouth daily. 09/13/18  Yes Karamalegos, Devonne Doughty, DO    Allergies Codeine  Family History  Problem Relation Age of Onset  . Hypertension Mother   . Diabetes Mother   . Hypertension Father   . Heart attack Father     Social History Social History   Tobacco Use  . Smoking status: Current Every Day Smoker    Packs/day: 1.50    Types: Cigarettes  . Smokeless tobacco: Current User  Substance Use Topics  . Alcohol use: Yes  Alcohol/week: 49.0 standard drinks    Types: 49 Cans of beer per week  . Drug use: No    Review of Systems  Constitutional: No fever/chills.  Positive for fatigue. Eyes: No visual changes. ENT: No sore throat. Cardiovascular: Denies chest pain. Respiratory: Denies shortness of breath. Gastrointestinal: No abdominal pain.  No nausea, no vomiting.  No diarrhea.  No constipation. Genitourinary: Negative for dysuria.  Positive for dark urine. Musculoskeletal: Negative for back pain. Skin: Negative for rash.  Positive for jaundice. Neurological: Negative for headaches, focal  weakness or numbness.  ____________________________________________   PHYSICAL EXAM:  VITAL SIGNS: ED Triage Vitals  Enc Vitals Group     BP 04/26/19 0912 113/78     Pulse Rate 04/26/19 0912 75     Resp 04/26/19 0912 17     Temp 04/26/19 0912 98.7 F (37.1 C)     Temp Source 04/26/19 0912 Oral     SpO2 04/26/19 0912 98 %     Weight 04/26/19 0913 179 lb (81.2 kg)     Height 04/26/19 0913 5\' 11"  (1.803 m)     Head Circumference --      Peak Flow --      Pain Score 04/26/19 0913 4     Pain Loc --      Pain Edu? --      Excl. in [place]? --     Constitutional: Alert and oriented. Eyes: Conjunctivae are normal.  Scleral icterus noted. Head: Atraumatic. Nose: No congestion/rhinnorhea. Mouth/Throat: Mucous membranes are moist. Neck: Normal ROM Cardiovascular: Normal rate, regular rhythm. Grossly normal heart sounds. Respiratory: Normal respiratory effort.  No retractions. Lungs CTAB. Gastrointestinal: Soft and nontender.  Mildly distended abdomen. Genitourinary: deferred Musculoskeletal: No lower extremity tenderness nor edema. Neurologic:  Normal speech and language. No gross focal neurologic deficits are appreciated. Skin:  Skin is warm, dry and intact. No rash noted.  Jaundiced throughout. Psychiatric: Mood and affect are normal. Speech and behavior are normal.  ____________________________________________   LABS (all labs ordered are listed, but only abnormal results are displayed)  Labs Reviewed  CBC WITH DIFFERENTIAL/PLATELET - Abnormal; Notable for the following components:      Result Value   WBC 14.1 (*)    MCHC 37.4 (*)    RDW 15.9 (*)    Neutro Abs 10.8 (*)    Monocytes Absolute 1.4 (*)    Basophils Absolute 0.2 (*)    Abs Immature Granulocytes 0.15 (*)    All other components within normal limits  ACETAMINOPHEN LEVEL - Abnormal; Notable for the following components:   Acetaminophen (Tylenol), Serum <10 (*)    All other components within normal limits    BASIC METABOLIC PANEL - Abnormal; Notable for the following components:   Sodium 132 (*)    Potassium <2.0 (*)    Chloride 94 (*)    Glucose, Bld 118 (*)    All other components within normal limits  HEPATIC FUNCTION PANEL - Abnormal; Notable for the following components:   AST 226 (*)    ALT 568 (*)    Alkaline Phosphatase 600 (*)    Total Bilirubin 21.7 (*)    Bilirubin, Direct 13.7 (*)    Indirect Bilirubin 8.0 (*)    All other components within normal limits  URINALYSIS, COMPLETE (UACMP) WITH MICROSCOPIC - Abnormal; Notable for the following components:   Color, Urine AMBER (*)    APPearance CLEAR (*)    Bilirubin Urine MODERATE (*)    All other components  within normal limits  RESPIRATORY PANEL BY RT PCR (FLU A&B, COVID)  LIPASE, BLOOD  PROTIME-INR  MAGNESIUM  PHOSPHORUS   ____________________________________________  EKG  ED ECG REPORT I, Blake Divine, the attending physician, personally viewed and interpreted this ECG.   Date: 04/26/2019  EKG Time: 9:56  Rate: 71  Rhythm: normal sinus rhythm  Axis: Normal  Intervals:first-degree A-V block  and nonspecific intraventricular conduction delay  ST&T Change: Diffuse ST depression   PROCEDURES  Procedure(s) performed (including Critical Care):  .Critical Care Performed by: Blake Divine, MD Authorized by: Blake Divine, MD   Critical care provider statement:    Critical care time (minutes):  45   Critical care time was exclusive of:  Separately billable procedures and treating other patients and teaching time   Critical care was necessary to treat or prevent imminent or life-threatening deterioration of the following conditions:  Hepatic failure   Critical care was time spent personally by me on the following activities:  Discussions with consultants, evaluation of patient's response to treatment, examination of patient, ordering and performing treatments and interventions, ordering and review of  laboratory studies, ordering and review of radiographic studies, pulse oximetry, re-evaluation of patient's condition, obtaining history from patient or surrogate and review of old charts   I assumed direction of critical care for this patient from another provider in my specialty: no       ____________________________________________   INITIAL IMPRESSION / ASSESSMENT AND PLAN / ED COURSE       64 year old male with history of hypertension, alcohol abuse, and renal cell carcinoma status post nephrectomy who presents to the ED complaining of abnormal bilirubin and potassium noted on outpatient lab work.  On recheck his bilirubin levels remain markedly elevated and potassium undetectably low at less than 2.  He does appear to have EKG changes related to his hypokalemia and potassium repletion was initiated along with IV magnesium.  His significant history of alcohol abuse seems to be the most likely cause of his acute liver disease, although he does not appear to be in acute liver failure given his normal INR.  He denies any Tylenol use and acetaminophen level is undetectable.  Case discussed with hepatology at [facility], who does not currently have beds available.  Case discussed with Dr. Allen Norris of GI, who states that he would also be concerned for choledocholithiasis given patient's enlarged CBD and elevated alkaline phosphatase.  Orders placed for MRCP and case discussed with hospitalist for admission.      ____________________________________________   FINAL CLINICAL IMPRESSION(S) / ED DIAGNOSES  Final diagnoses:  Liver disease  Hepatitis  Hypokalemia     ED Discharge Orders    None       Note:  This document was prepared using Dragon voice recognition software and may include unintentional dictation errors.   Blake Divine, MD 04/26/19 1536

## 2019-04-26 NOTE — Telephone Encounter (Signed)
Copied from [place] [id number]. Topic: General - Other >> Apr 26, 2019  8:09 AM Celene Kras wrote: [facility], called stating that they have critical lab results for pt. She states that the potassium is 2.4 and the ALT is 537. Pleasde advise.

## 2019-04-26 NOTE — ED Notes (Signed)
Adjusted IV potassium rate to 60 ml/hr, pt c/o burning.

## 2019-04-26 NOTE — ED Notes (Signed)
[facility] transfer center called for a potential transfer

## 2019-04-26 NOTE — ED Triage Notes (Signed)
Pt sent by MD office for critical lab results of critical potassium, bilirubin and liver enzymes.

## 2019-04-26 NOTE — H&P (Signed)
History and Physical    Kenneth Maxwell [id number] DOB: [birth year] DOA: 04/26/2019  Referring MD/NP/PA:   PCP: Olin Hauser, DO   Patient coming from:  The patient is coming from home.  At baseline, pt is independent for most of ADL.        Chief Complaint: Generalized weakness, abnormal lab  HPI: Kenneth Maxwell is a 64 y.o. male with medical history significant of hypertension, alcohol abuse, tobacco abuse, renal cell carcinoma (s/p right nephrectomy 11/2018), anxiety, who presents with generalized weakness, abnormal lab.  Patient states that he has been feeling generalized weak in the past several days.  He noticed his urine is dark, but no dysuria or burning on urination.  No unilateral numbness or tingling his extremities with no facial droop or slurred speech.  Patient does not have chest pain, fever or chills.  Patient states that he chronically mild cough and shortness of breath due to smoking, which has not changed.  He has minimal lower abdominal pain, no nausea vomiting.  He states that he had 3 times of loose stool diarrhea today.  Patient was seen by PCP on Wednesday, and found to have hypokalemia and abnormal liver function.  He was referred to the ED for further evaluation. He admits to significant alcohol consumption in the past, but states he quit drinking entirely about 1 month ago.     ED Course: pt was found to have WBC 14.1, lipas 44, INR 0.9, pending COVID-19 PCR, Tylenol level less than 10, abnormal liver function (ALP 600, AST 226, ALT 568, total bilirubin 21.7, direct bilirubin 13.7), potassium less than 2, renal function okay, temperature normal, blood pressure 112/69, heart rate 55, RR 17, oxygen saturation 99% on room air.  Patient is admitted to [facility] bed as inpatient.  Gastroenterologist, Dr. Allen Norris is consulted.  # US-RUQ: 1. 3.6 cm hypoechoic mass along the posterior margin of the left liver lobe, not visualized on the prior studies. Recommend  follow-upliver MRI without and with contrast for further assessment.  2. Gallbladder sludge, probable small polyps and a 1.2 cm nonshadowing stone versus a larger polyp. No evidence of acute cholecystitis. 3. Dilated common bile duct to 1.3 cm, increased in size compared to the prior studies. No visualized duct stone. Recommend follow-up MRCP to accompany the liver MRI, for further assessment  # MRCP: 1. Hypoenhancing mass of the pancreatic head measuring approximately 3.6 cm, obstructing the common bile duct near the ampulla, consistent with pancreatic adenocarcinoma. The pancreatic duct is nondilated. 2.  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 3.  No significant lymphadenopathy. 4. Status post right nephrectomy. No evidence of malignant recurrence at the nephrectomy site. 5.  Cholelithiasis.  Review of Systems:   General: no fevers, chills, no body weight gain, has poor appetite, has fatigue HEENT: no blurry vision, hearing changes or sore throat Respiratory: has dyspnea, coughing, no wheezing CV: no chest pain, no palpitations GI: no nausea, vomiting, has abdominal pain, diarrhea, no constipation GU: no dysuria, burning on urination, increased urinary frequency, hematuria  Ext: no leg edema Neuro: no unilateral weakness, numbness, or tingling, no vision change or hearing loss Skin: no rash, no skin tear. MSK: No muscle spasm, no deformity, no limitation of range of movement in spin Heme: No easy bruising.  Travel history: No recent long distant travel.  Allergy:  Allergies  Allergen Reactions  . Codeine Rash and Other (See Comments)    Past Medical History:  Diagnosis  Date  . Alcohol abuse   . Arthritis   . Blood clotting disorder ([place])   . Chronic renal disease   . H/O urinary retention   . Hematuria, gross   . Hypertension   . Renal cell carcinoma ([place]) 11/2013   kidney cancer s/p R nephrectomy  . Right renal mass     Past Surgical  History:  Procedure Laterality Date  . NEPHRECTOMY RADICAL Right 11/2013   R side kidney cancer surgery, non adrenal sparing    Social History:  reports that he has been smoking cigarettes. He has been smoking about 1.50 packs per day. He uses smokeless tobacco. He reports current alcohol use of about 49.0 standard drinks of alcohol per week. He reports that he does not use drugs.  Family History:  Family History  Problem Relation Age of Onset  . Hypertension Mother   . Diabetes Mother   . Hypertension Father   . Heart attack Father      Prior to Admission medications   Medication Sig Start Date End Date Taking? Authorizing Provider  amLODipine (NORVASC) 10 MG tablet Take 1 tablet (10 mg total) by mouth daily. 09/13/18   Karamalegos, Devonne Doughty, DO  chlorthalidone (HYGROTON) 25 MG tablet Take 1 tablet (25 mg total) by mouth daily. 09/13/18   Karamalegos, Devonne Doughty, DO  LORazepam (ATIVAN) 0.5 MG tablet Take 1-2 tablets 30-60 minutes prior to blood draw. Can take additional tab if needed at time of test. Must have driver to and from test. Can cause sedation. 04/24/19   Karamalegos, Devonne Doughty, DO  losartan (COZAAR) 100 MG tablet Take 1 tablet (100 mg total) by mouth daily. 09/13/18   Olin Hauser, DO    Physical Exam: Vitals:   04/26/19 1345 04/26/19 1400 04/26/19 1530 04/26/19 1714  BP: 131/78 124/77 115/78 125/88  Pulse: 67 (!) 58 65 73  Resp: 13 17 17 18   Temp:    97.9 F (36.6 C)  TempSrc:    Oral  SpO2: 96% 98% 98% 98%  Weight:      Height:       General: Not in acute distress HEENT:       Eyes: PERRL, EOMI, has scleral icterus.       ENT: No discharge from the ears and nose, no pharynx injection, no tonsillar enlargement.        Neck: No JVD, no bruit, no mass felt. Heme: No neck lymph node enlargement. Cardiac: S1/S2, RRR, No murmurs, No gallops or rubs. Respiratory: No rales, wheezing, rhonchi or rubs. GI: Soft, nondistended, mild tenderness in lower  abdomen, no rebound pain, no organomegaly, BS present. GU: No hematuria Ext: No pitting leg edema bilaterally. 2+DP/PT pulse bilaterally. Musculoskeletal: No joint deformities, No joint redness or warmth, no limitation of ROM in spin. Skin: No rashes.  Neuro: Alert, oriented X3, cranial nerves II-XII grossly intact, moves all extremities normally.   Psych: Patient is not psychotic, no suicidal or hemocidal ideation.  Labs on Admission: I have personally reviewed following labs and imaging studies  CBC: Recent Labs  Lab 04/25/19 0920 04/26/19 1033  WBC 12.8* 14.1*  NEUTROABS 9,690* 10.8*  HGB 17.5* 17.0  HCT 48.9 45.4  MCV 88.6 83.0  PLT 284 [id number]   Basic Metabolic Panel: Recent Labs  Lab 04/25/19 0920 04/26/19 1033 04/26/19 1655  NA 135 132*  --   K 2.4* <2.0*  --   CL 95* 94*  --   CO2 27 25  --  GLUCOSE 102 118*  --   BUN 17 18  --   CREATININE 1.38* 0.93  --   CALCIUM 9.7 9.7  --   MG  --  2.4  --   PHOS  --   --  UNABLE TO REPORT DUE TO ICTERUS   GFR: Estimated Creatinine Clearance: 88.8 mL/min (by C-G formula based on SCr of 0.93 mg/dL). Liver Function Tests: Recent Labs  Lab 04/25/19 0920 04/26/19 1033  AST 236* 226*  ALT 537* 568*  ALKPHOS  --  600*  BILITOT 18.2* 21.7*  PROT 6.4 7.8  ALBUMIN  --  3.6   Recent Labs  Lab 04/26/19 1033  LIPASE 44   Recent Labs  Lab 04/26/19 1655  AMMONIA UNABLE TO REPORT DUE TO ICTERUS    Coagulation Profile: Recent Labs  Lab 04/26/19 1033  INR 0.9   Cardiac Enzymes: No results for input(s): CKTOTAL, CKMB, CKMBINDEX, TROPONINI in the last 168 hours. BNP (last 3 results) No results for input(s): PROBNP in the last 8760 hours. HbA1C: No results for input(s): HGBA1C in the last 72 hours. CBG: No results for input(s): GLUCAP in the last 168 hours. Lipid Profile: No results for input(s): CHOL, HDL, LDLCALC, TRIG, CHOLHDL, LDLDIRECT in the last 72 hours. Thyroid Function Tests: No results for input(s):  TSH, T4TOTAL, FREET4, T3FREE, THYROIDAB in the last 72 hours. Anemia Panel: No results for input(s): VITAMINB12, FOLATE, FERRITIN, TIBC, IRON, RETICCTPCT in the last 72 hours. Urine analysis:    Component Value Date/Time   COLORURINE AMBER (A) 04/26/2019 1033   APPEARANCEUR CLEAR (A) 04/26/2019 1033   APPEARANCEUR Clear 02/11/2015 1004   LABSPEC 1.010 04/26/2019 1033   LABSPEC 1.005 11/07/2013 1115   PHURINE 7.0 04/26/2019 1033   GLUCOSEU NEGATIVE 04/26/2019 1033   GLUCOSEU Negative 11/07/2013 1115   HGBUR NEGATIVE 04/26/2019 1033   BILIRUBINUR MODERATE (A) 04/26/2019 1033   BILIRUBINUR Negative 02/11/2015 1004   BILIRUBINUR Negative 11/07/2013 1115   KETONESUR NEGATIVE 04/26/2019 1033   PROTEINUR NEGATIVE 04/26/2019 1033   NITRITE NEGATIVE 04/26/2019 1033   LEUKOCYTESUR NEGATIVE 04/26/2019 1033   LEUKOCYTESUR Negative 11/07/2013 1115   Sepsis Labs: @LABRCNTIP (procalcitonin:4,lacticidven:4) ) Recent Results (from the past 240 hour(s))  Urine Culture     Status: None   Collection Time: 04/24/19  2:47 PM   Specimen: Urine  Result Value Ref Range Status   MICRO NUMBER: [id number]  Final   SPECIMEN QUALITY: Adequate  Final   Sample Source NOT GIVEN  Final   STATUS: FINAL  Final   Result: No Growth  Final  Respiratory Panel by RT PCR (Flu A&B, Covid) - Nasopharyngeal Swab     Status: None   Collection Time: 04/26/19  1:44 PM   Specimen: Nasopharyngeal Swab  Result Value Ref Range Status   SARS Coronavirus 2 by RT PCR NEGATIVE NEGATIVE Final    Comment: (NOTE) SARS-CoV-2 target nucleic acids are NOT DETECTED. The SARS-CoV-2 RNA is generally detectable in upper respiratoy specimens during the acute phase of infection. The lowest concentration of SARS-CoV-2 viral copies this assay can detect is 131 copies/mL. A negative result does not preclude SARS-Cov-2 infection and should not be used as the sole basis for treatment or other patient management decisions. A negative result  may occur with  improper specimen collection/handling, submission of specimen other than nasopharyngeal swab, presence of viral mutation(s) within the areas targeted by this assay, and inadequate number of viral copies (<131 copies/mL). A negative result must be combined with clinical observations,  patient history, and epidemiological information. The expected result is Negative. Fact Sheet for Patients:  PinkCheek.be Fact Sheet for Healthcare Providers:  GravelBags.it This test is not yet ap proved or cleared by the [place] FDA and  has been authorized for detection and/or diagnosis of SARS-CoV-2 by FDA under an Emergency Use Authorization (EUA). This EUA will remain  in effect (meaning this test can be used) for the duration of the COVID-19 declaration under Section 564(b)(1) of the Act, 21 U.S.C. section 360bbb-3(b)(1), unless the authorization is terminated or revoked sooner.    Influenza A by PCR NEGATIVE NEGATIVE Final   Influenza B by PCR NEGATIVE NEGATIVE Final    Comment: (NOTE) The Xpert Xpress SARS-CoV-2/FLU/RSV assay is intended as an aid in  the diagnosis of influenza from Nasopharyngeal swab specimens and  should not be used as a sole basis for treatment. Nasal washings and  aspirates are unacceptable for Xpert Xpress SARS-CoV-2/FLU/RSV  testing. Fact Sheet for Patients: PinkCheek.be Fact Sheet for Healthcare Providers: GravelBags.it This test is not yet approved or cleared by the [place] FDA and  has been authorized for detection and/or diagnosis of SARS-CoV-2 by  FDA under an Emergency Use Authorization (EUA). This EUA will remain  in effect (meaning this test can be used) for the duration of the  Covid-19 declaration under Section 564(b)(1) of the Act, 21  U.S.C. section 360bbb-3(b)(1), unless the authorization is  terminated or  revoked. Performed at [facility], [address], [place], [place] [address]      Radiological Exams on Admission: DG Eye Foreign Body  Result Date: 04/26/2019 CLINICAL DATA:  Screening orbits for MRI EXAM: ORBITS FOR FOREIGN BODY - 2 VIEW COMPARISON:  10/31/2013 FINDINGS: Two frontal views of the orbits demonstrate no radiopaque foreign bodies. No bony abnormalities. Sinuses are clear. IMPRESSION: 1. No radiopaque foreign bodies. Electronically Signed   By: Randa Ngo M.D.   On: 04/26/2019 15:18   MR ABDOMEN MRCP W WO CONTAST  Result Date: 04/26/2019 CLINICAL DATA:  Liver lesion, cholelithiasis, history of renal cell carcinoma and right nephrectomy EXAM: MRI ABDOMEN WITHOUT AND WITH CONTRAST (INCLUDING MRCP) TECHNIQUE: Multiplanar multisequence MR imaging of the abdomen was performed both before and after the administration of intravenous contrast. Heavily T2-weighted images of the biliary and pancreatic ducts were obtained, and three-dimensional MRCP images were rendered by post processing. CONTRAST:  7.54mL GADAVIST GADOBUTROL 1 MMOL/ML IV SOLN COMPARISON:  Abdominal ultrasound, 04/26/2019, CT abdomen pelvis, 06/30/2014, MR abdomen pelvis, 10/31/2013 FINDINGS: Lower chest: No acute findings. Hepatobiliary: There are T2 hyperintense, intrinsically T1 hypointense lesions of the subcapsular anterior right lobe of the liver adjacent to the gallbladder fossa, hepatic segment V, measuring 2.4 x 2.0 cm and of the posterior left lobe of the liver, segment III, measuring 3.7 x 2.4 cm. These lesions demonstrate central hypoenhancement with progressive rim enhancement on multiphasic contrast enhanced imaging. There are additional small, fluid signal nonenhancing cysts of the liver. Extrahepatic biliary ductal dilatation, with enlargement of the common bile duct up to 14 mm. Pancreas: There is a T2 hyperintense, slightly T1 hypointense, hypoenhancing mass of the pancreatic head measuring  approximately 3.6 x 3.1 cm (series 21, image 54). This lesion is situated very laterally and near the ampulla, and does not closely abut the portal vein, superior mesenteric vein, celiac axis, superior mesenteric artery. Spleen:  Within normal limits in size and appearance. Adrenals/Urinary Tract: Status post right nephrectomy. No masses identified. No evidence of hydronephrosis. Stomach/Bowel: Visualized portions within  the abdomen are unremarkable. Vascular/Lymphatic: No pathologically enlarged lymph nodes identified. No abdominal aortic aneurysm demonstrated. Other:  None. Musculoskeletal: No suspicious bone lesions identified. IMPRESSION: 1. Hypoenhancing mass of the pancreatic head measuring approximately 3.6 cm, obstructing the common bile duct near the ampulla, consistent with pancreatic adenocarcinoma. The pancreatic duct is nondilated. 2. 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 3.  No significant lymphadenopathy. 4. Status post right nephrectomy. No evidence of malignant recurrence at the nephrectomy site. 5.  Cholelithiasis. Electronically Signed   By: Eddie Candle M.D.   On: 04/26/2019 16:56   US Abdomen Limited RUQ  Result Date: 04/26/2019 CLINICAL DATA:  Liver disease. History of a right nephrectomy for renal cell carcinoma. EXAM: ULTRASOUND ABDOMEN LIMITED RIGHT UPPER QUADRANT COMPARISON:  CT, 06/30/2014.  MRI, 10/31/2013. FINDINGS: Gallbladder: Distended. Some dependent sludge. 1.2 cm apparent nonshadowing gallstone. Several echogenic foci along the wall consistent with small polyps. The apparent stone could potentially reflect a polyp. No wall thickening. No pericholecystic fluid. Common bile duct: Diameter: 13 mm.  No visualized duct stone or sludge. Liver: Increased liver parenchymal echogenicity. Subtle nodular contour. There is a hypoechoic mass with a nearly anechoic partial rim, along the posterior left lobe, measuring 3.6 x 2.4 x 3.4 cm, not visualized on the  prior MRI or CT. No other liver masses or lesions. Portal vein is patent on color Doppler imaging with normal direction of blood flow towards the liver. Other: None. IMPRESSION: 1. 3.6 cm hypoechoic mass along the posterior margin of the left liver lobe, not visualized on the prior studies. Recommend follow-up liver MRI without and with contrast for further assessment. 2. Gallbladder sludge, probable small polyps and a 1.2 cm nonshadowing stone versus a larger polyp. No evidence of acute cholecystitis. 3. Dilated common bile duct to 1.3 cm, increased in size compared to the prior studies. No visualized duct stone. Recommend follow-up MRCP to accompany the liver MRI, for further assessment. Electronically Signed   By: Lajean Manes M.D.   On: 04/26/2019 12:19     EKG: Independently reviewed.  Sinus rhythm, QTC 409, T wave inversion in inferior leads and V5-V6  Assessment/Plan Principal Problem:   Abnormal LFTs Active Problems:   Hypertension   Tobacco use   Alcohol abuse   Hypokalemia   Leukocytosis   Diarrhea   Pancreatic adenocarcinoma (HCC)   Abnormal LFTs due to possible pancreatic adenocarcinoma: -will admit to med-surg bed as inp -Dr. Allen Norris of GI is consulted -Dr. Tasia Catchings of oncology is consulted -->will see pt tomorrow -hepatitis panel -Avoid using Tylenol -Check CA 19-9 -Check ammonia level  Essential hypertension: -prn Hydralazine -Continue home medications: Amlodipine and Cozaar -Hold Hygroton while patient is n.p.o.  Tobacco abuse and Alcohol abuse: -Did counseling about importance of quitting smoking -Nicotine patch -Did counseling about the importance of quitting drinking -CIWA protocol  Check: k <2.0 -Repleted potassium with total of 120 mEq of potassium chloride -Patient was also given 2 g of magnesium sulfate in ED (magnesium 2.4)  Leukocytosis: WBC 14. No signs of infection, likely reactive.  No fever. -Follow-up urine analysis -Follow-up by  CBC  Diarrhea -Check C. difficile PCR and GI pathogen panel     DVT ppx: SCD Code Status: Full code Family Communication:    Yes, patient's wife at bed side Disposition Plan:  Anticipate discharge back to previous home environment Consults called:  Dr. Allen Norris of IG and Dr. Tasia Catchings of oncology Admission status: Med-urg bed as inpt  Status is: Inpatient Remains inpatient appropriate because:Ongoing diagnostic testing needed not appropriate for outpatient work up Dispo: The patient is from: Home              Anticipated d/c is to: Home              Anticipated d/c date is: 2 days              Patient currently is not medically stable to d/c.           Date of Service 04/26/2019    [facility]   If 7PM-7AM, please contact night-coverage [email or web address] 04/26/2019, 6:32 PM

## 2019-04-26 NOTE — ED Notes (Signed)
Adjusted potassium rate. Pt c/o burning in arm.

## 2019-04-26 NOTE — ED Notes (Signed)
Pt trx to US  

## 2019-04-27 ENCOUNTER — Encounter: Payer: Self-pay | Admitting: Internal Medicine

## 2019-04-27 ENCOUNTER — Encounter
Admission: EM | Disposition: A | Discharge status: Home / Self Care | Payer: Self-pay | Source: Home / Self Care | Attending: Student

## 2019-04-27 DIAGNOSIS — F1010 Alcohol abuse, uncomplicated: Secondary | ICD-10-CM | POA: Diagnosis not present

## 2019-04-27 DIAGNOSIS — R945 Abnormal results of liver function studies: Secondary | ICD-10-CM | POA: Diagnosis not present

## 2019-04-27 DIAGNOSIS — C259 Malignant neoplasm of pancreas, unspecified: Secondary | ICD-10-CM

## 2019-04-27 DIAGNOSIS — R748 Abnormal levels of other serum enzymes: Secondary | ICD-10-CM

## 2019-04-27 DIAGNOSIS — D72825 Bandemia: Secondary | ICD-10-CM

## 2019-04-27 DIAGNOSIS — E871 Hypo-osmolality and hyponatremia: Secondary | ICD-10-CM

## 2019-04-27 DIAGNOSIS — E876 Hypokalemia: Secondary | ICD-10-CM

## 2019-04-27 DIAGNOSIS — K831 Obstruction of bile duct: Secondary | ICD-10-CM | POA: Diagnosis not present

## 2019-04-27 DIAGNOSIS — K8689 Other specified diseases of pancreas: Secondary | ICD-10-CM

## 2019-04-27 DIAGNOSIS — Z720 Tobacco use: Secondary | ICD-10-CM

## 2019-04-27 DIAGNOSIS — R197 Diarrhea, unspecified: Secondary | ICD-10-CM

## 2019-04-27 LAB — GASTROINTESTINAL PANEL BY PCR, STOOL (REPLACES STOOL CULTURE)

## 2019-04-27 LAB — CBC
Hemoglobin: 15.4 g/dL (ref 13.0–17.0)
Platelets: 260 10*3/uL (ref 150–400)
WBC: 12.9 10*3/uL — ABNORMAL HIGH (ref 4.0–10.5)
nRBC: 0 % (ref 0.0–0.2)

## 2019-04-27 LAB — COMPREHENSIVE METABOLIC PANEL
ALT: 472 U/L — ABNORMAL HIGH (ref 0–44)
AST: 200 U/L — ABNORMAL HIGH (ref 15–41)
Albumin: 3.1 g/dL — ABNORMAL LOW (ref 3.5–5.0)
Alkaline Phosphatase: 539 U/L — ABNORMAL HIGH (ref 38–126)
Anion gap: 11 (ref 5–15)
BUN: 19 mg/dL (ref 8–23)
CO2: 21 mmol/L — ABNORMAL LOW (ref 22–32)
Calcium: 8.9 mg/dL (ref 8.9–10.3)
Chloride: 98 mmol/L (ref 98–111)
Creatinine, Ser: 0.89 mg/dL (ref 0.61–1.24)
GFR calc Af Amer: >60 mL/min (ref >60)
GFR calc non Af Amer: >60 mL/min (ref >60)
Glucose, Bld: 116 mg/dL — ABNORMAL HIGH (ref 70–99)
Potassium: 2.4 mmol/L — CL (ref 3.5–5.1)
Sodium: 130 mmol/L — ABNORMAL LOW (ref 135–145)
Total Bilirubin: 17.7 mg/dL — ABNORMAL HIGH (ref 0.3–1.2)
Total Protein: 6.6 g/dL (ref 6.5–8.1)

## 2019-04-27 LAB — MAGNESIUM: Magnesium: 2.6 mg/dL — ABNORMAL HIGH (ref 1.7–2.4)

## 2019-04-27 LAB — HIV ANTIBODY (ROUTINE TESTING W REFLEX): HIV Screen 4th Generation wRfx: NONREACTIVE

## 2019-04-27 LAB — C DIFFICILE QUICK SCREEN W PCR REFLEX
C Diff antigen: NEGATIVE
C Diff interpretation: NOT DETECTED
C Diff toxin: NEGATIVE

## 2019-04-27 LAB — GLUCOSE, CAPILLARY: Glucose-Capillary: 91 mg/dL (ref 70–99)

## 2019-04-27 SURGERY — ENDOSCOPIC RETROGRADE CHOLANGIOPANCREATOGRAPHY (ERCP) WITH PROPOFOL
Anesthesia: General

## 2019-04-27 MED ORDER — HYDROMORPHONE HCL 1 MG/ML IJ SOLN
0.5000 mg | INTRAMUSCULAR | Status: DC | PRN
  Administered 2019-04-27: 0.5 mg via INTRAVENOUS
  Filled 2019-04-27: qty 0.5

## 2019-04-27 MED ORDER — POTASSIUM CHLORIDE 10 MEQ/100ML IV SOLN
10.0000 meq | INTRAVENOUS | Status: DC
  Administered 2019-04-27 (×3): 10 meq via INTRAVENOUS
  Filled 2019-04-27 (×2): qty 100

## 2019-04-27 MED ORDER — POTASSIUM CHLORIDE CRYS ER 20 MEQ PO TBCR
40.0000 meq | EXTENDED_RELEASE_TABLET | ORAL | Status: AC
  Administered 2019-04-27 (×2): 40 meq via ORAL
  Filled 2019-04-27 (×2): qty 2

## 2019-04-27 MED ORDER — TRAMADOL HCL 50 MG PO TABS: 50.0000 mg | ORAL_TABLET | Freq: Four times a day (QID) | ORAL | Status: DC | PRN

## 2019-04-27 MED ORDER — ALUM & MAG HYDROXIDE-SIMETH 200-200-20 MG/5ML PO SUSP
30.0000 mL | Freq: Four times a day (QID) | ORAL | Status: DC | PRN
  Administered 2019-04-27: 30 mL via ORAL
  Filled 2019-04-27: qty 30

## 2019-04-27 MED ORDER — SODIUM CHLORIDE 0.9 % IV SOLN
INTRAVENOUS | Status: DC | PRN
  Administered 2019-04-27 (×2): 250 mL via INTRAVENOUS

## 2019-04-27 NOTE — Progress Notes (Signed)
PROGRESS NOTE  Kenneth Maxwell [id number] DOB: 13-Nov-1955   PCP: Olin Hauser, DO  Patient is from: Home.  DOA: 04/26/2019 LOS: 1  Brief Narrative / Interim history: 64 year old male with history of HTN, previous alcohol abuse, tobacco abuse, RCC s/p right nephrectomy in 11/2018 and anxiety presented with generalized weakness and "abnormal labs".  Patient had generalized weakness over 2 to 3 weeks, and dark urine.  Presented to PCP, who noticed jaundice in his eyes and did blood work.  He was found to have significant LFT with hypokalemia and directed to ED.  Sober for about a months.  In ED, hemodynamically stable.  WBC 14.1.  Lipase 44.  INR 0.9.  COVID-19 PCR negative.  ALP 600.  AST 226.  ALT 568.  Total bili 22.  Direct bili 14.  K <2.0.  RUQ ultrasound with 3.6 cm hypoechoic mass along the posterior margin of the left liver lobe gallbladder sludge, 1.2 cm nonshadowing stone versus larger polyp and 1.3 cm dilated CBD.  MRCP with hypoenhancing mass of pancreatic head measuring about 3.6 cm obstructing CBD near the ampulla consistent with pancreatic adenocarcinoma, hypoenhancing metastasis of the liver and cholelithiasis.  GI and oncology consulted  Subjective: Seen and examined earlier this morning.  No major events overnight of this morning.  No complaint this morning.  Denies chest pain, dyspnea, nausea, vomiting, abdominal pain or UTI symptoms other than urine color change.  Objective: Vitals:   04/26/19 1530 04/26/19 1714 04/26/19 1947 04/27/19 0441  BP: 115/78 125/88 115/73 120/79  Pulse: 65 73 65 63  Resp: 17 18 16 20   Temp:  97.9 F (36.6 C) 97.8 F (36.6 C) 97.7 F (36.5 C)  TempSrc:  Oral Oral Oral  SpO2: 98% 98% 99% 98%  Weight:      Height:        Intake/Output Summary (Last 24 hours) at 04/27/2019 1411 Last data filed at 04/27/2019 1123 Gross per 24 hour  Intake 648.95 ml  Output 1500 ml  Net -851.05 ml   Filed Weights   04/26/19 0913    Weight: 81.2 kg    Examination:  GENERAL: No apparent distress. Nontoxic.  HEENT: MMM.  Sclerae icteric. NECK: Supple.  No apparent JVD.  RESP:  No IWOB. Good air movement bilaterally. CVS:  RRR. Heart sounds normal.  ABD/GI/GU: BS present. Soft. Non tender.  MSK/EXT:  Moves extremities. No apparent deformity. No edema.  SKIN: no apparent skin lesion or wound NEURO: Awake, alert and oriented appropriately.  No apparent focal neuro deficit. PSYCH: Calm. Normal affect.  Procedures:  None  Microbiology summarized: COVID-19 PCR negative. GIP and C. difficile negative.  Assessment & Plan: Abnormal LFT/ALP/hyperbilirubinemia-likely due to possible pancreatic adenocarcinoma obstructing CBD-improving.  Acute hepatitis panel negative. -Continue trending -GI and oncology following for possible pancreatic adenocarcinoma  Possible pancreatic adenocarcinoma and liver mets: as noted on MRCP and RUQ ultrasound -Plan for ERCP with tissue biopsy on 4/25 -Follow CA 19-9.  Essential hypertension:: Normotensive -Continue home amlodipine and Cozaar. -As needed hydralazine. -Hold home chlorthalidone in the setting of hypokalemia.  History of alcohol abuse: Reportedly sober for about a month now.  No withdrawal signs. -CIWA and as needed Ativan. -Vitamins.  Tobacco abuse: -Cessation counseling -Nicotine patch  Hypokalemia: Likely due to chlorthalidone. -Hold chlorthalidone -Replenish and recheck.  Hyponatremia: Likely due to chlorthalidone. -Hold chlorthalidone -Continue monitoring  Leukocytosis/bandemia: Suspect demargination.  Improving. -Continue monitoring  Diarrhea: Likely due to pancreatic cancer.  GI panel and C. difficile negative. -  As needed Imodium  History of renal cancer status post right nephrectomy in 11/2018. -Outpatient follow-up                   DVT prophylaxis: SCD Code Status: Full code Family Communication: Patient and/or RN. Available  if any question.   Discharge barrier: Evaluation for elevated liver enzymes and possible pancreatic adenocarcinoma Patient is from: Home Final disposition: To be determined  Consultants:  Gastroenterology Oncology   Sch Meds:  Scheduled Meds: . amLODipine  10 mg Oral Daily  . folic acid  1 mg Oral Daily  . LORazepam  0-4 mg Intravenous Q6H   Followed by  . [START ON 04/28/2019] LORazepam  0-4 mg Intravenous Q12H  . losartan  100 mg Oral Daily  . multivitamin with minerals  1 tablet Oral Daily  . nicotine  21 mg Transdermal Daily  . thiamine  100 mg Oral Daily   Or  . thiamine  100 mg Intravenous Daily   Continuous Infusions: . sodium chloride 75 mL/hr at 04/27/19 0702  . sodium chloride 250 mL (04/27/19 0658)   PRN Meds:.sodium chloride, albuterol, dextromethorphan-guaiFENesin, hydrALAZINE, LORazepam, LORazepam, LORazepam **OR** LORazepam, LORazepam, ondansetron (ZOFRAN) IV  Antimicrobials: Anti-infectives (From admission, onward)   None       I have personally reviewed the following labs and images: CBC: Recent Labs  Lab 04/25/19 0920 04/26/19 1033 04/27/19 0430  WBC 12.8* 14.1* 12.9*  NEUTROABS 9,690* 10.8*  --   HGB 17.5* 17.0 15.4  HCT 48.9 45.4 RESULTS UNAVAILABLE DUE TO INTERFERING SUBSTANCE  MCV 88.6 83.0 RESULTS UNAVAILABLE DUE TO INTERFERING SUBSTANCE  PLT 284 267 260   BMP &GFR Recent Labs  Lab 04/25/19 0920 04/26/19 1033 04/26/19 1655 04/27/19 0430  NA 135 132*  --  130*  K 2.4* <2.0*  --  2.4*  CL 95* 94*  --  98  CO2 27 25  --  21*  GLUCOSE 102 118*  --  116*  BUN 17 18  --  19  CREATININE 1.38* 0.93  --  0.89  CALCIUM 9.7 9.7  --  8.9  MG  --  2.4  --  2.6*  PHOS  --   --  UNABLE TO REPORT DUE TO ICTERUS  --    Estimated Creatinine Clearance: 92.8 mL/min (by C-G formula based on SCr of 0.89 mg/dL). Liver & Pancreas: Recent Labs  Lab 04/25/19 0920 04/26/19 1033 04/27/19 0430  AST 236* 226* 200*  ALT 537* 568* 472*  ALKPHOS  --   600* 539*  BILITOT 18.2* 21.7* 17.7*  PROT 6.4 7.8 6.6  ALBUMIN  --  3.6 3.1*   Recent Labs  Lab 04/26/19 1033  LIPASE 44   Recent Labs  Lab 04/26/19 1655  AMMONIA UNABLE TO REPORT DUE TO ICTERUS    Diabetic: No results for input(s): HGBA1C in the last 72 hours. Recent Labs  Lab 04/27/19 0738  GLUCAP 91   Cardiac Enzymes: No results for input(s): CKTOTAL, CKMB, CKMBINDEX, TROPONINI in the last 168 hours. No results for input(s): PROBNP in the last 8760 hours. Coagulation Profile: Recent Labs  Lab 04/26/19 1033  INR 0.9   Thyroid Function Tests: No results for input(s): TSH, T4TOTAL, FREET4, T3FREE, THYROIDAB in the last 72 hours. Lipid Profile: No results for input(s): CHOL, HDL, LDLCALC, TRIG, CHOLHDL, LDLDIRECT in the last 72 hours. Anemia Panel: No results for input(s): VITAMINB12, FOLATE, FERRITIN, TIBC, IRON, RETICCTPCT in the last 72 hours. Urine analysis:  Component Value Date/Time   COLORURINE AMBER (A) 04/26/2019 1033   APPEARANCEUR CLEAR (A) 04/26/2019 1033   APPEARANCEUR Clear 02/11/2015 1004   LABSPEC 1.010 04/26/2019 1033   LABSPEC 1.005 11/07/2013 1115   PHURINE 7.0 04/26/2019 1033   GLUCOSEU NEGATIVE 04/26/2019 1033   GLUCOSEU Negative 11/07/2013 1115   HGBUR NEGATIVE 04/26/2019 1033   BILIRUBINUR MODERATE (A) 04/26/2019 1033   BILIRUBINUR Negative 02/11/2015 1004   BILIRUBINUR Negative 11/07/2013 1115   KETONESUR NEGATIVE 04/26/2019 1033   PROTEINUR NEGATIVE 04/26/2019 1033   NITRITE NEGATIVE 04/26/2019 1033   LEUKOCYTESUR NEGATIVE 04/26/2019 1033   LEUKOCYTESUR Negative 11/07/2013 1115   Sepsis Labs: Invalid input(s): PROCALCITONIN, [place]  Microbiology: Recent Results (from the past 240 hour(s))  Urine Culture     Status: None   Collection Time: 04/24/19  2:47 PM   Specimen: Urine  Result Value Ref Range Status   MICRO NUMBER: [id number]  Final   SPECIMEN QUALITY: Adequate  Final   Sample Source NOT GIVEN  Final   STATUS:  FINAL  Final   Result: No Growth  Final  Respiratory Panel by RT PCR (Flu A&B, Covid) - Nasopharyngeal Swab     Status: None   Collection Time: 04/26/19  1:44 PM   Specimen: Nasopharyngeal Swab  Result Value Ref Range Status   SARS Coronavirus 2 by RT PCR NEGATIVE NEGATIVE Final    Comment: (NOTE) SARS-CoV-2 target nucleic acids are NOT DETECTED. The SARS-CoV-2 RNA is generally detectable in upper respiratoy specimens during the acute phase of infection. The lowest concentration of SARS-CoV-2 viral copies this assay can detect is 131 copies/mL. A negative result does not preclude SARS-Cov-2 infection and should not be used as the sole basis for treatment or other patient management decisions. A negative result may occur with  improper specimen collection/handling, submission of specimen other than nasopharyngeal swab, presence of viral mutation(s) within the areas targeted by this assay, and inadequate number of viral copies (<131 copies/mL). A negative result must be combined with clinical observations, patient history, and epidemiological information. The expected result is Negative. Fact Sheet for Patients:  PinkCheek.be Fact Sheet for Healthcare Providers:  GravelBags.it This test is not yet ap proved or cleared by the [place] FDA and  has been authorized for detection and/or diagnosis of SARS-CoV-2 by FDA under an Emergency Use Authorization (EUA). This EUA will remain  in effect (meaning this test can be used) for the duration of the COVID-19 declaration under Section 564(b)(1) of the Act, 21 U.S.C. section 360bbb-3(b)(1), unless the authorization is terminated or revoked sooner.    Influenza A by PCR NEGATIVE NEGATIVE Final   Influenza B by PCR NEGATIVE NEGATIVE Final    Comment: (NOTE) The Xpert Xpress SARS-CoV-2/FLU/RSV assay is intended as an aid in  the diagnosis of influenza from Nasopharyngeal swab  specimens and  should not be used as a sole basis for treatment. Nasal washings and  aspirates are unacceptable for Xpert Xpress SARS-CoV-2/FLU/RSV  testing. Fact Sheet for Patients: PinkCheek.be Fact Sheet for Healthcare Providers: GravelBags.it This test is not yet approved or cleared by the [place] FDA and  has been authorized for detection and/or diagnosis of SARS-CoV-2 by  FDA under an Emergency Use Authorization (EUA). This EUA will remain  in effect (meaning this test can be used) for the duration of the  Covid-19 declaration under Section 564(b)(1) of the Act, 21  U.S.C. section 360bbb-3(b)(1), unless the authorization is  terminated or revoked. Performed at [facility],  [place], [place] [address]   C Difficile Quick Screen w PCR reflex     Status: None   Collection Time: 04/27/19  1:04 AM   Specimen: STOOL  Result Value Ref Range Status   C Diff antigen NEGATIVE NEGATIVE Final   C Diff toxin NEGATIVE NEGATIVE Final   C Diff interpretation No C. difficile detected.  Final    Comment: Performed at [facility], [address] [place], [place] [address]  Gastrointestinal Panel by PCR , Stool     Status: None   Collection Time: 04/27/19  1:04 AM   Specimen: Stool  Result Value Ref Range Status   Campylobacter species NOT DETECTED NOT DETECTED Final   Plesimonas shigelloides NOT DETECTED NOT DETECTED Final   Salmonella species NOT DETECTED NOT DETECTED Final   Yersinia enterocolitica NOT DETECTED NOT DETECTED Final   Vibrio species NOT DETECTED NOT DETECTED Final   Vibrio cholerae NOT DETECTED NOT DETECTED Final   Enteroaggregative E coli (EAEC) NOT DETECTED NOT DETECTED Final   Enteropathogenic E coli (EPEC) NOT DETECTED NOT DETECTED Final   Enterotoxigenic E coli (ETEC) NOT DETECTED NOT DETECTED Final   Shiga like toxin producing E coli (STEC) NOT DETECTED NOT DETECTED  Final   Shigella/Enteroinvasive E coli (EIEC) NOT DETECTED NOT DETECTED Final   Cryptosporidium NOT DETECTED NOT DETECTED Final   Cyclospora cayetanensis NOT DETECTED NOT DETECTED Final   Entamoeba histolytica NOT DETECTED NOT DETECTED Final   Giardia lamblia NOT DETECTED NOT DETECTED Final   Adenovirus F40/41 NOT DETECTED NOT DETECTED Final   Astrovirus NOT DETECTED NOT DETECTED Final   Norovirus GI/GII NOT DETECTED NOT DETECTED Final   Rotavirus A NOT DETECTED NOT DETECTED Final   Sapovirus (I, II, IV, and V) NOT DETECTED NOT DETECTED Final    Comment: Performed at [facility], [address], [place], [place] [address]    Radiology Studies: DG Eye Foreign Body  Result Date: 04/26/2019 CLINICAL DATA:  Screening orbits for MRI EXAM: ORBITS FOR FOREIGN BODY - 2 VIEW COMPARISON:  10/31/2013 FINDINGS: Two frontal views of the orbits demonstrate no radiopaque foreign bodies. No bony abnormalities. Sinuses are clear. IMPRESSION: 1. No radiopaque foreign bodies. Electronically Signed   By: Randa Ngo M.D.   On: 04/26/2019 15:18   MR ABDOMEN MRCP W WO CONTAST  Result Date: 04/26/2019 CLINICAL DATA:  Liver lesion, cholelithiasis, history of renal cell carcinoma and right nephrectomy EXAM: MRI ABDOMEN WITHOUT AND WITH CONTRAST (INCLUDING MRCP) TECHNIQUE: Multiplanar multisequence MR imaging of the abdomen was performed both before and after the administration of intravenous contrast. Heavily T2-weighted images of the biliary and pancreatic ducts were obtained, and three-dimensional MRCP images were rendered by post processing. CONTRAST:  7.21mL GADAVIST GADOBUTROL 1 MMOL/ML IV SOLN COMPARISON:  Abdominal ultrasound, 04/26/2019, CT abdomen pelvis, 06/30/2014, MR abdomen pelvis, 10/31/2013 FINDINGS: Lower chest: No acute findings. Hepatobiliary: There are T2 hyperintense, intrinsically T1 hypointense lesions of the subcapsular anterior right lobe of the liver adjacent to the gallbladder  fossa, hepatic segment V, measuring 2.4 x 2.0 cm and of the posterior left lobe of the liver, segment III, measuring 3.7 x 2.4 cm. These lesions demonstrate central hypoenhancement with progressive rim enhancement on multiphasic contrast enhanced imaging. There are additional small, fluid signal nonenhancing cysts of the liver. Extrahepatic biliary ductal dilatation, with enlargement of the common bile duct up to 14 mm. Pancreas: There is a T2 hyperintense, slightly T1 hypointense, hypoenhancing mass of the pancreatic head  measuring approximately 3.6 x 3.1 cm (series 21, image 54). This lesion is situated very laterally and near the ampulla, and does not closely abut the portal vein, superior mesenteric vein, celiac axis, superior mesenteric artery. Spleen:  Within normal limits in size and appearance. Adrenals/Urinary Tract: Status post right nephrectomy. No masses identified. No evidence of hydronephrosis. Stomach/Bowel: Visualized portions within the abdomen are unremarkable. Vascular/Lymphatic: No pathologically enlarged lymph nodes identified. No abdominal aortic aneurysm demonstrated. Other:  None. Musculoskeletal: No suspicious bone lesions identified. IMPRESSION: 1. Hypoenhancing mass of the pancreatic head measuring approximately 3.6 cm, obstructing the common bile duct near the ampulla, consistent with pancreatic adenocarcinoma. The pancreatic duct is nondilated. 2. 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 3.  No significant lymphadenopathy. 4. Status post right nephrectomy. No evidence of malignant recurrence at the nephrectomy site. 5.  Cholelithiasis. Electronically Signed   By: Eddie Candle M.D.   On: 04/26/2019 16:56     Deamonte Sayegh T. Dixon  If 7PM-7AM, please contact night-coverage [email or web address] Password [facility] 04/27/2019, 2:11 PM

## 2019-04-27 NOTE — Consult Note (Signed)
Lucilla Lame, MD [facility]  [address] [place], [place] [address] Phone: [phone] Fax : [phone]  Consultation  Referring Provider:     Dr. Charna Archer Primary Care Physician:  Olin Hauser, DO Primary Gastroenterologist: Althia Forts         Reason for Consultation:     Abnormal liver enzymes  Date of Admission:  04/26/2019 Date of Consultation:  04/27/2019         HPI:   Kenneth Maxwell is a 64 y.o. male who was admitted from the emergency room after being found to have abnormal liver enzymes.  The patient was thought to have alcoholic hepatitis and from the report of the EGD.the [facility] hepatology service was called who recommended the patient be treated at [facility] since his INR was not prolonged.  A GI consult was then called and upon review of the chart a MRI was recommended because the labs were more consistent with an obstructive picture than it was with a alcoholic hepatitis picture.  The patient then had an MRI that showed a mass in the pancreatic head causing obstruction of the distal common bile duct and biliary ductal dilatation.  Past Medical History:  Diagnosis Date  . Alcohol abuse   . Arthritis   . Blood clotting disorder ([place])   . Chronic renal disease   . H/O urinary retention   . Hematuria, gross   . Hypertension   . Renal cell carcinoma ([place]) 11/2013   kidney cancer s/p R nephrectomy  . Right renal mass     Past Surgical History:  Procedure Laterality Date  . NEPHRECTOMY RADICAL Right 11/2013   R side kidney cancer surgery, non adrenal sparing    Prior to Admission medications   Medication Sig Start Date End Date Taking? Authorizing Provider  amLODipine (NORVASC) 10 MG tablet Take 1 tablet (10 mg total) by mouth daily. 09/13/18  Yes Karamalegos, Devonne Doughty, DO  chlorthalidone (HYGROTON) 25 MG tablet Take 1 tablet (25 mg total) by mouth daily. 09/13/18  Yes Karamalegos, Devonne Doughty, DO  LORazepam (ATIVAN) 0.5 MG tablet Take 1-2 tablets  30-60 minutes prior to blood draw. Can take additional tab if needed at time of test. Must have driver to and from test. Can cause sedation. 04/24/19  Yes Karamalegos, Devonne Doughty, DO  losartan (COZAAR) 100 MG tablet Take 1 tablet (100 mg total) by mouth daily. 09/13/18  Yes Karamalegos, Devonne Doughty, DO    Family History  Problem Relation Age of Onset  . Hypertension Mother   . Diabetes Mother   . Hypertension Father   . Heart attack Father      Social History   Tobacco Use  . Smoking status: Current Every Day Smoker    Packs/day: 1.50    Types: Cigarettes  . Smokeless tobacco: Current User  Substance Use Topics  . Alcohol use: Yes    Alcohol/week: 49.0 standard drinks    Types: 49 Cans of beer per week  . Drug use: No    Allergies as of 04/26/2019 - Review Complete 04/26/2019  Allergen Reaction Noted  . Codeine Rash and Other (See Comments) 09/13/2018    Review of Systems:    All systems reviewed and negative except where noted in HPI.   Physical Exam:  Vital signs in last 24 hours: Temp:  [97.7 F (36.5 C)-97.9 F (36.6 C)] 97.7 F (36.5 C) (04/24 0441) Pulse Rate:  [58-73] 63 (04/24 0441) Resp:  [16-20] 20 (04/24 0441) BP: (115-125)/(73-88)  120/79 (04/24 0441) SpO2:  [98 %-99 %] 98 % (04/24 0441) Last BM Date: 04/26/19 General:   Pleasant, cooperative in NAD Head:  Normocephalic and atraumatic. Eyes:   Positive icterus.   Conjunctiva yellow. PERRLA. Ears:  Normal auditory acuity. Neck:  Supple; no masses or thyroidomegaly Lungs: Respirations even and unlabored. Lungs clear to auscultation bilaterally.   No wheezes, crackles, or rhonchi.  Heart:  Regular rate and rhythm;  Without murmur, clicks, rubs or gallops Abdomen:  Soft, nondistended, nontender. Normal bowel sounds. No appreciable masses or hepatomegaly.  No rebound or guarding.  Rectal:  Not performed. Msk:  Symmetrical without gross deformities.    Extremities:  Without edema, cyanosis or  clubbing. Neurologic:  Alert and oriented x3;  grossly normal neurologically. Skin:  Intact without significant lesions or rashes.  Jaundice Cervical Nodes:  No significant cervical adenopathy. Psych:  Alert and cooperative. Normal affect.  LAB RESULTS: Recent Labs    04/25/19 0920 04/26/19 1033 04/27/19 0430  WBC 12.8* 14.1* 12.9*  HGB 17.5* 17.0 15.4  HCT 48.9 45.4 RESULTS UNAVAILABLE DUE TO INTERFERING SUBSTANCE  PLT 284 267 260   BMET Recent Labs    04/25/19 0920 04/26/19 1033 04/27/19 0430  NA 135 132* 130*  K 2.4* <2.0* 2.4*  CL 95* 94* 98  CO2 27 25 21*  GLUCOSE 102 118* 116*  BUN 17 18 19   CREATININE 1.38* 0.93 0.89  CALCIUM 9.7 9.7 8.9   LFT Recent Labs    04/26/19 1033 04/26/19 1033 04/27/19 0430  PROT 7.8   < > 6.6  ALBUMIN 3.6   < > 3.1*  AST 226*   < > 200*  ALT 568*   < > 472*  ALKPHOS 600*   < > 539*  BILITOT 21.7*   < > 17.7*  BILIDIR 13.7*  --   --   IBILI 8.0*  --   --    < > = values in this interval not displayed.   PT/INR Recent Labs    04/26/19 1033  LABPROT 12.2  INR 0.9    STUDIES: DG Eye Foreign Body  Result Date: 04/26/2019 CLINICAL DATA:  Screening orbits for MRI EXAM: ORBITS FOR FOREIGN BODY - 2 VIEW COMPARISON:  10/31/2013 FINDINGS: Two frontal views of the orbits demonstrate no radiopaque foreign bodies. No bony abnormalities. Sinuses are clear. IMPRESSION: 1. No radiopaque foreign bodies. Electronically Signed   By: Randa Ngo M.D.   On: 04/26/2019 15:18   MR ABDOMEN MRCP W WO CONTAST  Result Date: 04/26/2019 CLINICAL DATA:  Liver lesion, cholelithiasis, history of renal cell carcinoma and right nephrectomy EXAM: MRI ABDOMEN WITHOUT AND WITH CONTRAST (INCLUDING MRCP) TECHNIQUE: Multiplanar multisequence MR imaging of the abdomen was performed both before and after the administration of intravenous contrast. Heavily T2-weighted images of the biliary and pancreatic ducts were obtained, and three-dimensional MRCP images  were rendered by post processing. CONTRAST:  7.72mL GADAVIST GADOBUTROL 1 MMOL/ML IV SOLN COMPARISON:  Abdominal ultrasound, 04/26/2019, CT abdomen pelvis, 06/30/2014, MR abdomen pelvis, 10/31/2013 FINDINGS: Lower chest: No acute findings. Hepatobiliary: There are T2 hyperintense, intrinsically T1 hypointense lesions of the subcapsular anterior right lobe of the liver adjacent to the gallbladder fossa, hepatic segment V, measuring 2.4 x 2.0 cm and of the posterior left lobe of the liver, segment III, measuring 3.7 x 2.4 cm. These lesions demonstrate central hypoenhancement with progressive rim enhancement on multiphasic contrast enhanced imaging. There are additional small, fluid signal nonenhancing cysts of the liver. Extrahepatic biliary  ductal dilatation, with enlargement of the common bile duct up to 14 mm. Pancreas: There is a T2 hyperintense, slightly T1 hypointense, hypoenhancing mass of the pancreatic head measuring approximately 3.6 x 3.1 cm (series 21, image 54). This lesion is situated very laterally and near the ampulla, and does not closely abut the portal vein, superior mesenteric vein, celiac axis, superior mesenteric artery. Spleen:  Within normal limits in size and appearance. Adrenals/Urinary Tract: Status post right nephrectomy. No masses identified. No evidence of hydronephrosis. Stomach/Bowel: Visualized portions within the abdomen are unremarkable. Vascular/Lymphatic: No pathologically enlarged lymph nodes identified. No abdominal aortic aneurysm demonstrated. Other:  None. Musculoskeletal: No suspicious bone lesions identified. IMPRESSION: 1. Hypoenhancing mass of the pancreatic head measuring approximately 3.6 cm, obstructing the common bile duct near the ampulla, consistent with pancreatic adenocarcinoma. The pancreatic duct is nondilated. 2. 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 3.  No significant lymphadenopathy. 4. Status post right nephrectomy. No evidence of  malignant recurrence at the nephrectomy site. 5.  Cholelithiasis. Electronically Signed   By: Eddie Candle M.D.   On: 04/26/2019 16:56   US Abdomen Limited RUQ  Result Date: 04/26/2019 CLINICAL DATA:  Liver disease. History of a right nephrectomy for renal cell carcinoma. EXAM: ULTRASOUND ABDOMEN LIMITED RIGHT UPPER QUADRANT COMPARISON:  CT, 06/30/2014.  MRI, 10/31/2013. FINDINGS: Gallbladder: Distended. Some dependent sludge. 1.2 cm apparent nonshadowing gallstone. Several echogenic foci along the wall consistent with small polyps. The apparent stone could potentially reflect a polyp. No wall thickening. No pericholecystic fluid. Common bile duct: Diameter: 13 mm.  No visualized duct stone or sludge. Liver: Increased liver parenchymal echogenicity. Subtle nodular contour. There is a hypoechoic mass with a nearly anechoic partial rim, along the posterior left lobe, measuring 3.6 x 2.4 x 3.4 cm, not visualized on the prior MRI or CT. No other liver masses or lesions. Portal vein is patent on color Doppler imaging with normal direction of blood flow towards the liver. Other: None. IMPRESSION: 1. 3.6 cm hypoechoic mass along the posterior margin of the left liver lobe, not visualized on the prior studies. Recommend follow-up liver MRI without and with contrast for further assessment. 2. Gallbladder sludge, probable small polyps and a 1.2 cm nonshadowing stone versus a larger polyp. No evidence of acute cholecystitis. 3. Dilated common bile duct to 1.3 cm, increased in size compared to the prior studies. No visualized duct stone. Recommend follow-up MRCP to accompany the liver MRI, for further assessment. Electronically Signed   By: Lajean Manes M.D.   On: 04/26/2019 12:19      Impression / Plan:   Assessment: Principal Problem:   Abnormal LFTs Active Problems:   Hypertension   Tobacco use   Alcohol abuse   Hypokalemia   Leukocytosis   Diarrhea   Pancreatic adenocarcinoma (HCC)   Kenneth Maxwell is a 64 y.o. y/o male with with abnormal liver enzymes and an admission showing bilirubin of 21 with liver enzymes consistent with a obstructive picture and a bile duct on ultrasound of 1.3 cm.  A MRI was ordered and showed a pancreatic head mass.  The patient was also found to have hypokalemia with a potassium of less than 2 on admission.  Plan: The plan is to set up an ERCP for Saturday if the potassium can be corrected.  The patient will be made n.p.o. after midnight for an ERCP.  The patient has been explained the results of the MRI and that he has  a mass in the pancreas.  I agree with the CA 19-9 and would recommend a oncology consult.  Patient has been explained the plan and agrees with it.  Thank you for involving me in the care of this patient.      LOS: 1 day   Lucilla Lame, MD  04/27/2019, 1:50 PM Pager [phone] 7am-5pm  Check AMION for 5pm -7am coverage and on weekends   Note: This dictation was prepared with Dragon dictation along with smaller phrase technology. Any transcriptional errors that result from this process are unintentional.

## 2019-04-27 NOTE — Anesthesia Preprocedure Evaluation (Addendum)
Anesthesia Evaluation   Patient awake    Reviewed: Allergy & Precautions, H&P , NPO status , reviewed documented beta blocker date and time   Airway Mallampati: II  TM Distance: >3 FB Neck ROM: full    Dental  (+) Poor Dentition, Chipped, Missing, Dental Advidsory Given Very poor dentition, denies loose teeth:   Pulmonary Current Smoker and Patient abstained from smoking.,    Pulmonary exam normal        Cardiovascular hypertension, Normal cardiovascular exam  1 AVb and diffuse ST changes on EKG. No report regarding ischemia   Neuro/Psych PSYCHIATRIC DISORDERS    GI/Hepatic   Endo/Other    Renal/GU Renal disease     Musculoskeletal  (+) Arthritis ,   Abdominal   Peds  Hematology   Anesthesia Other Findings Past Medical History: K=3.4 today No date: Alcohol abuse No date: Arthritis No date: Blood clotting disorder (HCC) No date: Chronic renal disease No date: H/O urinary retention No date: Hematuria, gross No date: Hypertension 11/2013: Renal cell carcinoma ([place])     Comment:  kidney cancer s/p R nephrectomy No date: Right renal mass  Past Surgical History: 11/2013: NEPHRECTOMY RADICAL; Right     Comment:  R side kidney cancer surgery, non adrenal sparing  BMI    Body Mass Index: 24.97 kg/m      Reproductive/Obstetrics                           Anesthesia Physical Anesthesia Plan  ASA: IV and emergent  Anesthesia Plan: General   Post-op Pain Management:    Induction: Intravenous  PONV Risk Score and Plan: Treatment may vary due to age or medical condition and TIVA  Airway Management Planned: Nasal Cannula, Natural Airway and Oral ETT  Additional Equipment:   Intra-op Plan:   Post-operative Plan:   Informed Consent: I have reviewed the patients History and Physical, chart, labs and discussed the procedure including the risks, benefits and alternatives for the  proposed anesthesia with the patient or authorized representative who has indicated his/her understanding and acceptance.     Dental Advisory Given  Plan Discussed with: CRNA  Anesthesia Plan Comments: (Poss ETT)        Anesthesia Quick Evaluation

## 2019-04-27 NOTE — Consult Note (Signed)
Hematology/Oncology Consult note [facility] [phone] [phone]  Patient Care Team: Olin Hauser, DO as PCP - General (Family Medicine)   Name of the patient: Kenneth Maxwell  [id number]  1955/06/17   Date of visit: 04/27/19 REASON FOR COSULTATION:   History of presenting illness-  64 y.o. 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 Patient reports feeling weak for the past few days, noticed that urine has turned dark.  Lost about 8 to 9 pounds for the past few weeks.  Denies any abdominal pain, nausea vomiting, fever or chills. Patient has some chronic cough due to smoking.  No significant shortness of breath or breathing difficulties.  Patient saw PCP and was found to have hypokalemia, abnormal liver function and was sent to ED for further evaluation. In ED, blood work showed severe hypokalemia, transaminitis and total bilirubin of 21.7 with direct bilirubin 13.7. Right upper quadrant ultrasound showed 3.6 cm hypoechoic mass along the posterior margin of the left liver.  Gallbladder sludge probably small polyp and 1.  2 stones versus large polyp.  Dilated common bile duct to 1.3 cm #MRCP showed hypoenhancing mass of the pancreatic head measuring 3.6 cm, obstructing common bile duct near the ampulla consistent with pancreatic adenocarcinoma.  Hypoenhancing metastasis of the liver in segment 4 and segment 3.  No significant lymphadenopathy.  Status post right nephrectomy with no evidence of recurrence at the nephrectomy site.  Cholelithiasis. Patient was admitted for further evaluation.  Heme-onc was consulted for further evaluation and management. GI has been consulted and plan ERCP. Today patient denies any pain.  He is jaundiced.   Wife is at bedside.   Review of Systems  Constitutional: Positive for fatigue and unexpected weight change. Negative for appetite change, chills and fever.  HENT:   Negative for hearing loss and voice change.   Eyes: Negative for eye problems and icterus.  Respiratory: Negative for chest tightness, cough and shortness of breath.   Cardiovascular: Negative for chest pain and leg swelling.  Gastrointestinal: Negative for abdominal distention and abdominal pain.  Endocrine: Negative for hot flashes.  Genitourinary: Negative for difficulty urinating, dysuria and frequency.   Musculoskeletal: Negative for arthralgias.  Skin: Negative for itching and rash.       Jaundiced  Neurological: Negative for light-headedness and numbness.  Hematological: Negative for adenopathy. Does not bruise/bleed easily.  Psychiatric/Behavioral: Negative for confusion.    Allergies  Allergen Reactions  . Codeine Rash and Other (See Comments)    Patient Active Problem List   Diagnosis Date Noted  . Hypokalemia 04/26/2019  . Abnormal LFTs 04/26/2019  . Leukocytosis 04/26/2019  . Diarrhea 04/26/2019  . Pancreatic adenocarcinoma ([place]) 04/26/2019  . Liver disease   . S/p nephrectomy 10/11/2016  . Arthritis 08/04/2014  . History of renal cell carcinoma 08/04/2014  . H/O cardiovascular disorder 08/04/2014  . Compulsive tobacco user syndrome 08/04/2014  . Hypertension 06/25/2014  . Tobacco use 06/25/2014  . Alcohol abuse 06/25/2014     Past Medical History:  Diagnosis Date  . Alcohol abuse   . Arthritis   . Blood clotting disorder ([place])   . Chronic renal disease   . H/O urinary retention   . Hematuria, gross   . Hypertension   . Renal cell carcinoma ([place]) 11/2013   kidney cancer s/p  R nephrectomy  . Right renal mass      Past Surgical History:  Procedure Laterality Date  . NEPHRECTOMY RADICAL Right 11/2013   R side kidney cancer surgery, non adrenal sparing    Social History    Socioeconomic History  . Marital status: Married    Spouse name: Not on file  . Number of children: Not on file  . Years of education: Not on file  . Highest education level: Not on file  Occupational History  . Not on file  Tobacco Use  . Smoking status: Current Every Day Smoker    Packs/day: 1.50    Types: Cigarettes  . Smokeless tobacco: Current User  Substance and Sexual Activity  . Alcohol use: Yes    Alcohol/week: 49.0 standard drinks    Types: 49 Cans of beer per week  . Drug use: No  . Sexual activity: Yes  Other Topics Concern  . Not on file  Social History Narrative  . Not on file   Social Determinants of Health   Financial Resource Strain:   . Difficulty of Paying Living Expenses:   Food Insecurity:   . Worried About Charity fundraiser in the Last Year:   . Arboriculturist in the Last Year:   Transportation Needs:   . Film/video editor (Medical):   Marland Kitchen Lack of Transportation (Non-Medical):   Physical Activity:   . Days of Exercise per Week:   . Minutes of Exercise per Session:   Stress:   . Feeling of Stress :   Social Connections:   . Frequency of Communication with Friends and Family:   . Frequency of Social Gatherings with Friends and Family:   . Attends Religious Services:   . Active Member of Clubs or Organizations:   . Attends Archivist Meetings:   Marland Kitchen Marital Status:   Intimate Partner Violence:   . Fear of Current or Ex-Partner:   . Emotionally Abused:   Marland Kitchen Physically Abused:   . Sexually Abused:      Family History  Problem Relation Age of Onset  . Hypertension Mother   . Diabetes Mother   . Hypertension Father   . Heart attack Father      Current Facility-Administered Medications:  .  0.9 %  sodium chloride infusion, , Intravenous, Continuous, Ivor Costa, MD, Last Rate: 75 mL/hr at 04/27/19 0702, New Bag at 04/27/19 0702 .  0.9 %  sodium chloride infusion, , Intravenous, PRN, Ivor Costa, MD, Last Rate: 10 mL/hr at  04/27/19 0658, 250 mL at 04/27/19 0658 .  albuterol (PROVENTIL) (2.5 MG/3ML) 0.083% nebulizer solution 2.5 mg, 2.5 mg, Nebulization, Q4H PRN, Charlett Nose, RPH .  amLODipine (NORVASC) tablet 10 mg, 10 mg, Oral, Daily, Ivor Costa, MD, 10 mg at 04/27/19 [id number] .  dextromethorphan-guaiFENesin ([place] DM) 30-600 MG per 12 hr tablet 1 tablet, 1 tablet, Oral, BID PRN, Ivor Costa, MD .  folic acid (FOLVITE) tablet 1 mg, 1 mg, Oral, Daily, Ivor Costa, MD, Stopped at 04/27/19 [phone] .  hydrALAZINE (APRESOLINE) injection 5 mg, 5 mg, Intravenous, Q2H PRN, Ivor Costa, MD .  LORazepam (ATIVAN) injection 0-4 mg, 0-4 mg, Intravenous, Q6H **FOLLOWED BY** [START ON 04/28/2019] LORazepam (ATIVAN) injection 0-4 mg, 0-4 mg, Intravenous, Q12H, Ivor Costa, MD .  LORazepam (ATIVAN) injection 0.5 mg, 0.5 mg, Intravenous, Q4H PRN, Ivor Costa, MD .  LORazepam (ATIVAN) injection 1 mg, 1 mg, Intravenous, Q4H PRN, Ivor Costa, MD .  LORazepam (ATIVAN) tablet  1-4 mg, 1-4 mg, Oral, Q1H PRN **OR** LORazepam (ATIVAN) injection 1-4 mg, 1-4 mg, Intravenous, Q1H PRN, Ivor Costa, MD .  LORazepam (ATIVAN) tablet 0.5 mg, 0.5 mg, Oral, Q4H PRN, Ivor Costa, MD .  losartan (COZAAR) tablet 100 mg, 100 mg, Oral, Daily, Ivor Costa, MD, 100 mg at 04/27/19 0942 .  multivitamin with minerals tablet 1 tablet, 1 tablet, Oral, Daily, Ivor Costa, MD, Stopped at 04/27/19 0825 .  nicotine (NICODERM CQ - dosed in mg/24 hours) patch 21 mg, 21 mg, Transdermal, Daily, Ivor Costa, MD, 21 mg at 04/27/19 0923 .  ondansetron (ZOFRAN) injection 4 mg, 4 mg, Intravenous, Q8H PRN, Ivor Costa, MD .  potassium chloride 10 mEq in 100 mL IVPB, 10 mEq, Intravenous, Q1 Hr x 6, Sharion Settler, NP, Last Rate: 100 mL/hr at 04/27/19 0921, 10 mEq at 04/27/19 0921 .  thiamine tablet 100 mg, 100 mg, Oral, Daily, 100 mg at 04/26/19 1527 **OR** thiamine (B-1) injection 100 mg, 100 mg, Intravenous, Daily, Ivor Costa, MD, 100 mg at 04/27/19 [id number]   Physical exam:  Vitals:    04/26/19 1530 04/26/19 1714 04/26/19 1947 04/27/19 0441  BP: 115/78 125/88 115/73 120/79  Pulse: 65 73 65 63  Resp: 17 18 16 20   Temp:  97.9 F (36.6 C) 97.8 F (36.6 C) 97.7 F (36.5 C)  TempSrc:  Oral Oral Oral  SpO2: 98% 98% 99% 98%  Weight:      Height:       Physical Exam  Constitutional: He is oriented to person, place, and time. No distress.  HENT:  Head: Normocephalic and atraumatic.  Nose: Nose normal.  Mouth/Throat: No oropharyngeal exudate.  Eyes: Pupils are equal, round, and reactive to light. EOM are normal. Scleral icterus is present.  Cardiovascular: Normal rate and regular rhythm.  No murmur heard. Pulmonary/Chest: Effort normal. No respiratory distress.  Abdominal: Soft. He exhibits no distension.  Musculoskeletal:        General: No edema. Normal range of motion.     Cervical back: Normal range of motion and neck supple.  Neurological: He is alert and oriented to person, place, and time.  Skin: Skin is warm and dry. No erythema.  Psychiatric: Affect normal.        CMP Latest Ref Rng & Units 04/27/2019  Glucose 70 - 99 mg/dL 116(H)  BUN 8 - 23 mg/dL 19  Creatinine 0.61 - 1.24 mg/dL 0.89  Sodium 135 - 145 mmol/L 130(L)  Potassium 3.5 - 5.1 mmol/L 2.4(LL)  Chloride 98 - 111 mmol/L 98  CO2 22 - 32 mmol/L 21(L)  Calcium 8.9 - 10.3 mg/dL 8.9  Total Protein 6.5 - 8.1 g/dL 6.6  Total Bilirubin 0.3 - 1.2 mg/dL 17.7(H)  Alkaline Phos 38 - 126 U/L 539(H)  AST 15 - 41 U/L 200(H)  ALT 0 - 44 U/L 472(H)   CBC Latest Ref Rng & Units 04/27/2019  WBC 4.0 - 10.5 K/uL 12.9(H)  Hemoglobin 13.0 - 17.0 g/dL 15.4  Hematocrit 39.0 - 52.0 % RESULTS UNAVAILABLE DUE TO INTERFERING SUBSTANCE  Platelets 150 - 400 K/uL 260    RADIOGRAPHIC STUDIES: I have personally reviewed the radiological images as listed and agreed with the findings in the report. DG Eye Foreign Body  Result Date: 04/26/2019 CLINICAL DATA:  Screening orbits for MRI EXAM: ORBITS FOR FOREIGN BODY - 2  VIEW COMPARISON:  10/31/2013 FINDINGS: Two frontal views of the orbits demonstrate no radiopaque foreign bodies. No bony abnormalities. Sinuses are clear. IMPRESSION: 1. No  radiopaque foreign bodies. Electronically Signed   By: Randa Ngo M.D.   On: 04/26/2019 15:18   MR ABDOMEN MRCP W WO CONTAST  Result Date: 04/26/2019 CLINICAL DATA:  Liver lesion, cholelithiasis, history of renal cell carcinoma and right nephrectomy EXAM: MRI ABDOMEN WITHOUT AND WITH CONTRAST (INCLUDING MRCP) TECHNIQUE: Multiplanar multisequence MR imaging of the abdomen was performed both before and after the administration of intravenous contrast. Heavily T2-weighted images of the biliary and pancreatic ducts were obtained, and three-dimensional MRCP images were rendered by post processing. CONTRAST:  7.45mL GADAVIST GADOBUTROL 1 MMOL/ML IV SOLN COMPARISON:  Abdominal ultrasound, 04/26/2019, CT abdomen pelvis, 06/30/2014, MR abdomen pelvis, 10/31/2013 FINDINGS: Lower chest: No acute findings. Hepatobiliary: There are T2 hyperintense, intrinsically T1 hypointense lesions of the subcapsular anterior right lobe of the liver adjacent to the gallbladder fossa, hepatic segment V, measuring 2.4 x 2.0 cm and of the posterior left lobe of the liver, segment III, measuring 3.7 x 2.4 cm. These lesions demonstrate central hypoenhancement with progressive rim enhancement on multiphasic contrast enhanced imaging. There are additional small, fluid signal nonenhancing cysts of the liver. Extrahepatic biliary ductal dilatation, with enlargement of the common bile duct up to 14 mm. Pancreas: There is a T2 hyperintense, slightly T1 hypointense, hypoenhancing mass of the pancreatic head measuring approximately 3.6 x 3.1 cm (series 21, image 54). This lesion is situated very laterally and near the ampulla, and does not closely abut the portal vein, superior mesenteric vein, celiac axis, superior mesenteric artery. Spleen:  Within normal limits in size and  appearance. Adrenals/Urinary Tract: Status post right nephrectomy. No masses identified. No evidence of hydronephrosis. Stomach/Bowel: Visualized portions within the abdomen are unremarkable. Vascular/Lymphatic: No pathologically enlarged lymph nodes identified. No abdominal aortic aneurysm demonstrated. Other:  None. Musculoskeletal: No suspicious bone lesions identified. IMPRESSION: 1. Hypoenhancing mass of the pancreatic head measuring approximately 3.6 cm, obstructing the common bile duct near the ampulla, consistent with pancreatic adenocarcinoma. The pancreatic duct is nondilated. 2. 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 3.  No significant lymphadenopathy. 4. Status post right nephrectomy. No evidence of malignant recurrence at the nephrectomy site. 5.  Cholelithiasis. Electronically Signed   By: Eddie Candle M.D.   On: 04/26/2019 16:56   US Abdomen Limited RUQ  Result Date: 04/26/2019 CLINICAL DATA:  Liver disease. History of a right nephrectomy for renal cell carcinoma. EXAM: ULTRASOUND ABDOMEN LIMITED RIGHT UPPER QUADRANT COMPARISON:  CT, 06/30/2014.  MRI, 10/31/2013. FINDINGS: Gallbladder: Distended. Some dependent sludge. 1.2 cm apparent nonshadowing gallstone. Several echogenic foci along the wall consistent with small polyps. The apparent stone could potentially reflect a polyp. No wall thickening. No pericholecystic fluid. Common bile duct: Diameter: 13 mm.  No visualized duct stone or sludge. Liver: Increased liver parenchymal echogenicity. Subtle nodular contour. There is a hypoechoic mass with a nearly anechoic partial rim, along the posterior left lobe, measuring 3.6 x 2.4 x 3.4 cm, not visualized on the prior MRI or CT. No other liver masses or lesions. Portal vein is patent on color Doppler imaging with normal direction of blood flow towards the liver. Other: None. IMPRESSION: 1. 3.6 cm hypoechoic mass along the posterior margin of the left liver lobe, not  visualized on the prior studies. Recommend follow-up liver MRI without and with contrast for further assessment. 2. Gallbladder sludge, probable small polyps and a 1.2 cm nonshadowing stone versus a larger polyp. No evidence of acute cholecystitis. 3. Dilated common bile duct to 1.3 cm, increased in  size compared to the prior studies. No visualized duct stone. Recommend follow-up MRCP to accompany the liver MRI, for further assessment. Electronically Signed   By: Lajean Manes M.D.   On: 04/26/2019 12:19    Assessment and plan-  64 year old male with past medical history significant for right clear-cell RCC status post nephrectomy in 2015, alcohol abuse, tobacco abuse, presented with weakness and severely abnormal liver function testing.  Image work-up reviewed pancreatic head mass with compression on common bile duct, multiple liver lesions.  #Obstructive jaundice, pancreatic head mass with liver lesions Clinically patient has likely stage IV pancreatic cancer. Pending ERCP for relieving CBD obstruction, and also tissue diagnosis. CA 19 9 has been sent, result is pending.  #Severe hypokalemia, monitor electrolytes.  Potassium slightly improved to 2.4.  Management per hospitalist team. #History of right clear-cell RCC, status post nephrectomy in 2015.  Image showed no evidence of nephrectomy site recurrence.   Thank you for allowing me to participate in the care of this patient.    Earlie Server, MD, PhD Hematology Oncology [facility] at [facility] Pager- [phone] 04/27/2019

## 2019-04-27 NOTE — Progress Notes (Signed)
Kenneth Lame, MD [facility]   [address], [place], [place] [address] Phone: [phone] Fax : [phone]   Subjective: The patient was not tolerating his potassium well and the rate had to be slowed down.  The patient's potassium only corrected to 2.4 this morning.  He is not reporting any new symptoms.   Objective: Vital signs in last 24 hours: Vitals:   04/26/19 1530 04/26/19 1714 04/26/19 1947 04/27/19 0441  BP: 115/78 125/88 115/73 120/79  Pulse: 65 73 65 63  Resp: 17 18 16 20   Temp:  97.9 F (36.6 C) 97.8 F (36.6 C) 97.7 F (36.5 C)  TempSrc:  Oral Oral Oral  SpO2: 98% 98% 99% 98%  Weight:      Height:       Weight change:   Intake/Output Summary (Last 24 hours) at 04/27/2019 1356 Last data filed at 04/27/2019 1123 Gross per 24 hour  Intake 648.95 ml  Output 1500 ml  Net -851.05 ml     Exam: Heart:: Regular rate and rhythm, S1S2 present or without murmur or extra heart sounds Lungs: normal and clear to auscultation and percussion Abdomen: soft, nontender, normal bowel sounds   Lab Results: @LABTEST2 @ Micro Results: Recent Results (from the past 240 hour(s))  Urine Culture     Status: None   Collection Time: 04/24/19  2:47 PM   Specimen: Urine  Result Value Ref Range Status   MICRO NUMBER: [id number]  Final   SPECIMEN QUALITY: Adequate  Final   Sample Source NOT GIVEN  Final   STATUS: FINAL  Final   Result: No Growth  Final  Respiratory Panel by RT PCR (Flu A&B, Covid) - Nasopharyngeal Swab     Status: None   Collection Time: 04/26/19  1:44 PM   Specimen: Nasopharyngeal Swab  Result Value Ref Range Status   SARS Coronavirus 2 by RT PCR NEGATIVE NEGATIVE Final    Comment: (NOTE) SARS-CoV-2 target nucleic acids are NOT DETECTED. The SARS-CoV-2 RNA is generally detectable in upper respiratoy specimens during the acute phase of infection. The lowest concentration of SARS-CoV-2 viral copies this assay can detect is 131 copies/mL. A negative  result does not preclude SARS-Cov-2 infection and should not be used as the sole basis for treatment or other patient management decisions. A negative result may occur with  improper specimen collection/handling, submission of specimen other than nasopharyngeal swab, presence of viral mutation(s) within the areas targeted by this assay, and inadequate number of viral copies (<131 copies/mL). A negative result must be combined with clinical observations, patient history, and epidemiological information. The expected result is Negative. Fact Sheet for Patients:  PinkCheek.be Fact Sheet for Healthcare Providers:  GravelBags.it This test is not yet ap proved or cleared by the [place] FDA and  has been authorized for detection and/or diagnosis of SARS-CoV-2 by FDA under an Emergency Use Authorization (EUA). This EUA will remain  in effect (meaning this test can be used) for the duration of the COVID-19 declaration under Section 564(b)(1) of the Act, 21 U.S.C. section 360bbb-3(b)(1), unless the authorization is terminated or revoked sooner.    Influenza A by PCR NEGATIVE NEGATIVE Final   Influenza B by PCR NEGATIVE NEGATIVE Final    Comment: (NOTE) The Xpert Xpress SARS-CoV-2/FLU/RSV assay is intended as an aid in  the diagnosis of influenza from Nasopharyngeal swab specimens and  should not be used as a sole basis for treatment. Nasal washings and  aspirates are unacceptable for Xpert Xpress SARS-CoV-2/FLU/RSV  testing. Fact Sheet for Patients: PinkCheek.be Fact Sheet for Healthcare Providers: GravelBags.it This test is not yet approved or cleared by the [place] FDA and  has been authorized for detection and/or diagnosis of SARS-CoV-2 by  FDA under an Emergency Use Authorization (EUA). This EUA will remain  in effect (meaning this test can be used) for the  duration of the  Covid-19 declaration under Section 564(b)(1) of the Act, 21  U.S.C. section 360bbb-3(b)(1), unless the authorization is  terminated or revoked. Performed at [facility], [place], [place] [address]   C Difficile Quick Screen w PCR reflex     Status: None   Collection Time: 04/27/19  1:04 AM   Specimen: STOOL  Result Value Ref Range Status   C Diff antigen NEGATIVE NEGATIVE Final   C Diff toxin NEGATIVE NEGATIVE Final   C Diff interpretation No C. difficile detected.  Final    Comment: Performed at [facility], [address] [place], [place] [address]  Gastrointestinal Panel by PCR , Stool     Status: None   Collection Time: 04/27/19  1:04 AM   Specimen: Stool  Result Value Ref Range Status   Campylobacter species NOT DETECTED NOT DETECTED Final   Plesimonas shigelloides NOT DETECTED NOT DETECTED Final   Salmonella species NOT DETECTED NOT DETECTED Final   Yersinia enterocolitica NOT DETECTED NOT DETECTED Final   Vibrio species NOT DETECTED NOT DETECTED Final   Vibrio cholerae NOT DETECTED NOT DETECTED Final   Enteroaggregative E coli (EAEC) NOT DETECTED NOT DETECTED Final   Enteropathogenic E coli (EPEC) NOT DETECTED NOT DETECTED Final   Enterotoxigenic E coli (ETEC) NOT DETECTED NOT DETECTED Final   Shiga like toxin producing E coli (STEC) NOT DETECTED NOT DETECTED Final   Shigella/Enteroinvasive E coli (EIEC) NOT DETECTED NOT DETECTED Final   Cryptosporidium NOT DETECTED NOT DETECTED Final   Cyclospora cayetanensis NOT DETECTED NOT DETECTED Final   Entamoeba histolytica NOT DETECTED NOT DETECTED Final   Giardia lamblia NOT DETECTED NOT DETECTED Final   Adenovirus F40/41 NOT DETECTED NOT DETECTED Final   Astrovirus NOT DETECTED NOT DETECTED Final   Norovirus GI/GII NOT DETECTED NOT DETECTED Final   Rotavirus A NOT DETECTED NOT DETECTED Final   Sapovirus (I, II, IV, and V) NOT DETECTED NOT DETECTED Final    Comment:  Performed at [facility], [address] [place], [place] [address]   Studies/Results: [place] [facility] Foreign Body  Result Date: 04/26/2019 CLINICAL DATA:  Screening orbits for MRI EXAM: ORBITS FOR FOREIGN BODY - 2 VIEW COMPARISON:  10/31/2013 FINDINGS: Two frontal views of the orbits demonstrate no radiopaque foreign bodies. No bony abnormalities. Sinuses are clear. IMPRESSION: 1. No radiopaque foreign bodies. Electronically Signed   By: Randa Ngo M.D.   On: 04/26/2019 15:18   MR ABDOMEN MRCP W WO CONTAST  Result Date: 04/26/2019 CLINICAL DATA:  Liver lesion, cholelithiasis, history of renal cell carcinoma and right nephrectomy EXAM: MRI ABDOMEN WITHOUT AND WITH CONTRAST (INCLUDING MRCP) TECHNIQUE: Multiplanar multisequence MR imaging of the abdomen was performed both before and after the administration of intravenous contrast. Heavily T2-weighted images of the biliary and pancreatic ducts were obtained, and three-dimensional MRCP images were rendered by post processing. CONTRAST:  7.54mL GADAVIST GADOBUTROL 1 MMOL/ML IV SOLN COMPARISON:  Abdominal ultrasound, 04/26/2019, CT abdomen pelvis, 06/30/2014, MR abdomen pelvis, 10/31/2013 FINDINGS: Lower chest: No acute findings. Hepatobiliary: There are T2 hyperintense, intrinsically T1 hypointense lesions of the subcapsular anterior right  lobe of the liver adjacent to the gallbladder fossa, hepatic segment V, measuring 2.4 x 2.0 cm and of the posterior left lobe of the liver, segment III, measuring 3.7 x 2.4 cm. These lesions demonstrate central hypoenhancement with progressive rim enhancement on multiphasic contrast enhanced imaging. There are additional small, fluid signal nonenhancing cysts of the liver. Extrahepatic biliary ductal dilatation, with enlargement of the common bile duct up to 14 mm. Pancreas: There is a T2 hyperintense, slightly T1 hypointense, hypoenhancing mass of the pancreatic head measuring approximately 3.6 x 3.1 cm (series 21,  image 54). This lesion is situated very laterally and near the ampulla, and does not closely abut the portal vein, superior mesenteric vein, celiac axis, superior mesenteric artery. Spleen:  Within normal limits in size and appearance. Adrenals/Urinary Tract: Status post right nephrectomy. No masses identified. No evidence of hydronephrosis. Stomach/Bowel: Visualized portions within the abdomen are unremarkable. Vascular/Lymphatic: No pathologically enlarged lymph nodes identified. No abdominal aortic aneurysm demonstrated. Other:  None. Musculoskeletal: No suspicious bone lesions identified. IMPRESSION: 1. Hypoenhancing mass of the pancreatic head measuring approximately 3.6 cm, obstructing the common bile duct near the ampulla, consistent with pancreatic adenocarcinoma. The pancreatic duct is nondilated. 2. 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 3.  No significant lymphadenopathy. 4. Status post right nephrectomy. No evidence of malignant recurrence at the nephrectomy site. 5.  Cholelithiasis. Electronically Signed   By: Eddie Candle M.D.   On: 04/26/2019 16:56   US Abdomen Limited RUQ  Result Date: 04/26/2019 CLINICAL DATA:  Liver disease. History of a right nephrectomy for renal cell carcinoma. EXAM: ULTRASOUND ABDOMEN LIMITED RIGHT UPPER QUADRANT COMPARISON:  CT, 06/30/2014.  MRI, 10/31/2013. FINDINGS: Gallbladder: Distended. Some dependent sludge. 1.2 cm apparent nonshadowing gallstone. Several echogenic foci along the wall consistent with small polyps. The apparent stone could potentially reflect a polyp. No wall thickening. No pericholecystic fluid. Common bile duct: Diameter: 13 mm.  No visualized duct stone or sludge. Liver: Increased liver parenchymal echogenicity. Subtle nodular contour. There is a hypoechoic mass with a nearly anechoic partial rim, along the posterior left lobe, measuring 3.6 x 2.4 x 3.4 cm, not visualized on the prior MRI or CT. No other liver masses or  lesions. Portal vein is patent on color Doppler imaging with normal direction of blood flow towards the liver. Other: None. IMPRESSION: 1. 3.6 cm hypoechoic mass along the posterior margin of the left liver lobe, not visualized on the prior studies. Recommend follow-up liver MRI without and with contrast for further assessment. 2. Gallbladder sludge, probable small polyps and a 1.2 cm nonshadowing stone versus a larger polyp. No evidence of acute cholecystitis. 3. Dilated common bile duct to 1.3 cm, increased in size compared to the prior studies. No visualized duct stone. Recommend follow-up MRCP to accompany the liver MRI, for further assessment. Electronically Signed   By: Lajean Manes M.D.   On: 04/26/2019 12:19   Medications: I have reviewed the patient's current medications. Scheduled Meds:  amLODipine  10 mg Oral Daily   folic acid  1 mg Oral Daily   LORazepam  0-4 mg Intravenous Q6H   Followed by   Derrill Memo ON 04/28/2019] LORazepam  0-4 mg Intravenous Q12H   losartan  100 mg Oral Daily   multivitamin with minerals  1 tablet Oral Daily   nicotine  21 mg Transdermal Daily   thiamine  100 mg Oral Daily   Or   thiamine  100 mg Intravenous Daily  Continuous Infusions:  sodium chloride 75 mL/hr at 04/27/19 [id number]   sodium chloride 250 mL (04/27/19 0658)   PRN Meds:.sodium chloride, albuterol, dextromethorphan-guaiFENesin, hydrALAZINE, LORazepam, LORazepam, LORazepam **OR** LORazepam, LORazepam, ondansetron (ZOFRAN) IV   Assessment: Principal Problem:   Abnormal LFTs Active Problems:   Hypertension   Tobacco use   Alcohol abuse   Hypokalemia   Leukocytosis   Diarrhea   Pancreatic adenocarcinoma (HCC)   Obstructive jaundice    Plan: The patient's ERCP will be postponed until tomorrow pending correction of his potassium.  Anesthesia requires a potassium of over 3.0 to proceed with anesthesia for an ERCP.  The patient has been explained the plan and agrees with it.    LOS: 1 day   Kenneth Maxwell 04/27/2019, 1:56 PM Pager [phone] 7am-5pm  Check AMION for 5pm -7am coverage and on weekends

## 2019-04-27 NOTE — Progress Notes (Signed)
Sharion Settler NP notified by secure chat potassium was 2.4

## 2019-04-28 ENCOUNTER — Encounter
Admission: EM | Disposition: A | Discharge status: Home / Self Care | Payer: Self-pay | Source: Home / Self Care | Attending: Student

## 2019-04-28 ENCOUNTER — Inpatient Hospital Stay: Payer: BC Managed Care – PPO | Admitting: Anesthesiology

## 2019-04-28 ENCOUNTER — Inpatient Hospital Stay: Payer: BC Managed Care – PPO

## 2019-04-28 DIAGNOSIS — R945 Abnormal results of liver function studies: Secondary | ICD-10-CM | POA: Diagnosis not present

## 2019-04-28 DIAGNOSIS — K838 Other specified diseases of biliary tract: Secondary | ICD-10-CM

## 2019-04-28 DIAGNOSIS — K8689 Other specified diseases of pancreas: Secondary | ICD-10-CM | POA: Diagnosis not present

## 2019-04-28 DIAGNOSIS — K3189 Other diseases of stomach and duodenum: Secondary | ICD-10-CM | POA: Diagnosis not present

## 2019-04-28 DIAGNOSIS — F1010 Alcohol abuse, uncomplicated: Secondary | ICD-10-CM | POA: Diagnosis not present

## 2019-04-28 DIAGNOSIS — D490 Neoplasm of unspecified behavior of digestive system: Secondary | ICD-10-CM

## 2019-04-28 DIAGNOSIS — I10 Essential (primary) hypertension: Secondary | ICD-10-CM | POA: Diagnosis not present

## 2019-04-28 DIAGNOSIS — R197 Diarrhea, unspecified: Secondary | ICD-10-CM | POA: Diagnosis not present

## 2019-04-28 DIAGNOSIS — E876 Hypokalemia: Secondary | ICD-10-CM | POA: Diagnosis not present

## 2019-04-28 HISTORY — PX: ERCP: SHX5425

## 2019-04-28 LAB — CBC
Hemoglobin: 14.7 g/dL (ref 13.0–17.0)
Platelets: 273 10*3/uL (ref 150–400)
WBC: 11.5 10*3/uL — ABNORMAL HIGH (ref 4.0–10.5)
nRBC: 0 % (ref 0.0–0.2)

## 2019-04-28 LAB — GLUCOSE, CAPILLARY
Glucose-Capillary: 104 mg/dL — ABNORMAL HIGH (ref 70–99)
Glucose-Capillary: 80 mg/dL (ref 70–99)
Glucose-Capillary: 92 mg/dL (ref 70–99)
Glucose-Capillary: 96 mg/dL (ref 70–99)

## 2019-04-28 LAB — RENAL FUNCTION PANEL
Albumin: 3.1 g/dL — ABNORMAL LOW (ref 3.5–5.0)
Anion gap: 9 (ref 5–15)
BUN: 15 mg/dL (ref 8–23)
CO2: 21 mmol/L — ABNORMAL LOW (ref 22–32)
Calcium: 9 mg/dL (ref 8.9–10.3)
Chloride: 103 mmol/L (ref 98–111)
Creatinine, Ser: 0.63 mg/dL (ref 0.61–1.24)
GFR calc Af Amer: >60 mL/min (ref >60)
GFR calc non Af Amer: >60 mL/min (ref >60)
Glucose, Bld: 81 mg/dL (ref 70–99)
Phosphorus: UNDETERMINED mg/dL (ref 2.5–4.6)
Potassium: 3.4 mmol/L — ABNORMAL LOW (ref 3.5–5.1)
Sodium: 133 mmol/L — ABNORMAL LOW (ref 135–145)

## 2019-04-28 LAB — COMPREHENSIVE METABOLIC PANEL
ALT: 457 U/L — ABNORMAL HIGH (ref 0–44)
AST: 201 U/L — ABNORMAL HIGH (ref 15–41)
Albumin: 3.1 g/dL — ABNORMAL LOW (ref 3.5–5.0)
Alkaline Phosphatase: 557 U/L — ABNORMAL HIGH (ref 38–126)
Anion gap: 10 (ref 5–15)
BUN: 16 mg/dL (ref 8–23)
CO2: 22 mmol/L (ref 22–32)
Calcium: 9 mg/dL (ref 8.9–10.3)
Chloride: 101 mmol/L (ref 98–111)
Creatinine, Ser: 0.76 mg/dL (ref 0.61–1.24)
GFR calc Af Amer: >60 mL/min (ref >60)
GFR calc non Af Amer: >60 mL/min (ref >60)
Glucose, Bld: 95 mg/dL (ref 70–99)
Potassium: 2.7 mmol/L — CL (ref 3.5–5.1)
Sodium: 133 mmol/L — ABNORMAL LOW (ref 135–145)
Total Bilirubin: 19.2 mg/dL (ref 0.3–1.2)
Total Protein: 6.5 g/dL (ref 6.5–8.1)

## 2019-04-28 LAB — MAGNESIUM: Magnesium: 2.3 mg/dL (ref 1.7–2.4)

## 2019-04-28 LAB — PHOSPHORUS: Phosphorus: UNDETERMINED mg/dL (ref 2.5–4.6)

## 2019-04-28 SURGERY — ERCP, WITH INTERVENTION IF INDICATED
Anesthesia: General

## 2019-04-28 MED ORDER — INDOMETHACIN 50 MG RE SUPP
RECTAL | Status: AC
  Filled 2019-04-28: qty 2

## 2019-04-28 MED ORDER — POTASSIUM CHLORIDE 10 MEQ/100ML IV SOLN
10.0000 meq | INTRAVENOUS | Status: AC
  Administered 2019-04-28 (×2): 10 meq via INTRAVENOUS

## 2019-04-28 MED ORDER — SPIRONOLACTONE 25 MG PO TABS: 25.0000 mg | ORAL_TABLET | Freq: Every day | ORAL | Status: DC

## 2019-04-28 MED ORDER — POTASSIUM CHLORIDE CRYS ER 20 MEQ PO TBCR
40.0000 meq | EXTENDED_RELEASE_TABLET | ORAL | Status: AC
  Administered 2019-04-28 (×2): 40 meq via ORAL
  Filled 2019-04-28 (×2): qty 2

## 2019-04-28 MED ORDER — INDOMETHACIN 50 MG RE SUPP
100.0000 mg | Freq: Once | RECTAL | Status: AC
  Administered 2019-04-28: 100 mg via RECTAL

## 2019-04-28 MED ORDER — PROPOFOL 10 MG/ML IV BOLUS
INTRAVENOUS | Status: DC | PRN
  Administered 2019-04-28: 100 ug/kg/min via INTRAVENOUS

## 2019-04-28 MED ORDER — LACTATED RINGERS IV SOLN
INTRAVENOUS | Status: DC
  Administered 2019-04-28: 15:00:00 1000 mL via INTRAVENOUS

## 2019-04-28 MED ORDER — POTASSIUM CHLORIDE 10 MEQ/100ML IV SOLN
10.0000 meq | INTRAVENOUS | Status: DC
  Filled 2019-04-28: qty 100

## 2019-04-28 MED ORDER — SPIRONOLACTONE 25 MG PO TABS
25.0000 mg | ORAL_TABLET | Freq: Every day | ORAL | Status: DC
  Administered 2019-04-28 – 2019-04-29 (×2): 25 mg via ORAL
  Filled 2019-04-28 (×2): qty 1

## 2019-04-28 MED ORDER — POTASSIUM CHLORIDE CRYS ER 20 MEQ PO TBCR: 40.0000 meq | EXTENDED_RELEASE_TABLET | ORAL | Status: DC

## 2019-04-28 NOTE — Op Note (Signed)
[facility] Gastroenterology Patient Name: Kenneth Maxwell Procedure Date: 04/28/2019 3:02 PM MRN: [id number] Account #: [id number] Date of Birth: 04/23/1955 Admit Type: Inpatient Age: 64 Room: [facility] ENDO ROOM 4 Gender: Male Note Status: Finalized Procedure:             ERCP Indications:           Tumor of the head of pancreas Providers:             Lucilla Lame MD, MD Medicines:             Propofol per Anesthesia Complications:         No immediate complications. Procedure:             Pre-Anesthesia Assessment:                        - Prior to the procedure, a History and Physical was                         performed, and patient medications and allergies were                         reviewed. The patient's tolerance of previous                         anesthesia was also reviewed. The risks and benefits                         of the procedure and the sedation options and risks                         were discussed with the patient. All questions were                         answered, and informed consent was obtained. Prior                         Anticoagulants: The patient has taken no previous                         anticoagulant or antiplatelet agents. ASA Grade                         Assessment: III - A patient with severe systemic                         disease. After reviewing the risks and benefits, the                         patient was deemed in satisfactory condition to                         undergo the procedure.                        After obtaining informed consent, the scope was passed                         under direct vision. Throughout the procedure, the  patient's blood pressure, pulse, and oxygen                         saturations were monitored continuously. The was                         introduced through the mouth, and used to inject                         contrast into and used to inject contrast into  the                         bile duct. The ERCP was accomplished without                         difficulty. The patient tolerated the procedure well. Findings:      The scout film was normal. The esophagus was successfully intubated       under direct vision. The scope was advanced to a normal major papilla in       the descending duodenum without detailed examination of the pharynx,       larynx and associated structures, and upper GI tract. The upper GI tract       was grossly normal. The bile duct was deeply cannulated with the       short-nosed traction sphincterotome. Contrast was injected. I personally       interpreted the bile duct images. There was brisk flow of contrast       through the ducts. Image quality was excellent. Contrast extended to the       entire biliary tree. The lower third of the main bile duct contained a       single localized stenosis. The main bile duct was diffusely dilated. The       largest diameter was 15 mm. A wire was passed into the biliary tree. An       8 mm biliary sphincterotomy was made with a traction (standard)       sphincterotome using ERBE electrocautery. There was no       post-sphincterotomy bleeding. One 10 Fr by 7 cm plastic stent with a       single external flap and a single internal flap was placed 2 cm into the       common bile duct. Bile flowed through the stent. The stent was in good       position. Duodenal mass A large mass with no bleeding was found in the       first portion of the duodenum. Duodenum were biopsied with a cold       forceps for histology. Impression:            - Duodenal mass.                        - A single localized biliary stricture was found in                         the lower third of the main bile duct.                        - The entire main bile duct was dilated.                        -  A biliary sphincterotomy was performed.                        - One plastic stent was placed into the common  bile                         duct.                        - Biopsy was performed of a duodenal mass. Recommendation:        - Return patient to hospital ward for ongoing care.                        - Full liquid diet today.                        - Watch for pancreatitis, bleeding, perforation, and                         cholangitis.                        - Repeat ERCP in 3 months to exchange stent. Procedure Code(s):     --- Professional ---                        [phone], Endoscopic retrograde cholangiopancreatography                         (ERCP); with placement of endoscopic stent into                         biliary or pancreatic duct, including pre- and                         post-dilation and guide wire passage, when performed,                         including sphincterotomy, when performed, each stent                        [id number], Endoscopic catheterization of the biliary                         ductal system, radiological supervision and                         interpretation Diagnosis Code(s):     --- Professional ---                        D49.0, Neoplasm of unspecified behavior of digestive                         system                        K83.8, Other specified diseases of biliary tract                        K31.89, Other diseases of stomach and duodenum CPT copyright 2019 American [employer] Association. All  rights reserved. The codes documented in this report are preliminary and upon coder review may  be revised to meet current compliance requirements. Lucilla Lame MD, MD 04/28/2019 3:43:05 PM This report has been signed electronically. Number of Addenda: 0 Note Initiated On: 04/28/2019 3:02 PM Estimated Blood Loss:  Estimated blood loss: none.      [facility]

## 2019-04-28 NOTE — Progress Notes (Signed)
15 minute call to floor. 

## 2019-04-28 NOTE — Anesthesia Postprocedure Evaluation (Signed)
Anesthesia Post Note  Patient: Kenneth Maxwell  Procedure(s) Performed: ENDOSCOPIC RETROGRADE CHOLANGIOPANCREATOGRAPHY (ERCP) (N/A )  Patient location during evaluation: PACU Anesthesia Type: General Level of consciousness: awake and alert Pain management: pain level controlled Vital Signs Assessment: post-procedure vital signs reviewed and stable Respiratory status: spontaneous breathing, nonlabored ventilation, respiratory function stable and patient connected to nasal cannula oxygen Cardiovascular status: blood pressure returned to baseline and stable Postop Assessment: no apparent nausea or vomiting Anesthetic complications: no     Last Vitals:  Vitals:   04/28/19 1600 04/28/19 1629  BP: (!) 151/84 (!) 160/98  Pulse: 65 72  Resp: 10 16  Temp:  36.9 C  SpO2: 98% 100%    Last Pain:  Vitals:   04/28/19 1629  TempSrc: Oral  PainSc:                  Alphonsus Sias

## 2019-04-28 NOTE — Progress Notes (Signed)
Report to Jack RN

## 2019-04-28 NOTE — Transfer of Care (Signed)
Immediate Anesthesia Transfer of Care Note  Patient: Kenneth Maxwell  Procedure(s) Performed: ENDOSCOPIC RETROGRADE CHOLANGIOPANCREATOGRAPHY (ERCP) (N/A )  Patient Location: PACU  Anesthesia Type:General  Level of Consciousness: awake and alert   Airway & Oxygen Therapy: Patient Spontanous Breathing  Post-op Assessment: Report given to RN  Post vital signs: Reviewed and stable  Last Vitals:  Vitals Value Taken Time  BP 152/77 04/28/19 1546  Temp 36 C 04/28/19 1546  Pulse 35 04/28/19 1546  Resp 19 04/28/19 1546  SpO2 100 % 04/28/19 1546  Vitals shown include unvalidated device data.  Last Pain:  Vitals:   04/28/19 1451  TempSrc: Temporal  PainSc:          Complications: No apparent anesthesia complications

## 2019-04-28 NOTE — Progress Notes (Signed)
PROGRESS NOTE  Kenneth Maxwell [id number] DOB: Dec 23, 1955   PCP: Olin Hauser, DO  Patient is from: Home.  DOA: 04/26/2019 LOS: 2  Brief Narrative / Interim history: 64 year old male with history of HTN, previous alcohol abuse, tobacco abuse, RCC s/p right nephrectomy in 11/2018 and anxiety presented with generalized weakness and "abnormal labs".  Patient had generalized weakness over 2 to 3 weeks, and dark urine.  Presented to PCP, who noticed jaundice in his eyes and did blood work.  He was found to have significant LFT with hypokalemia and directed to ED.  Sober for about a months.  In ED, hemodynamically stable.  WBC 14.1.  Lipase 44.  INR 0.9.  COVID-19 PCR negative.  ALP 600.  AST 226.  ALT 568.  Total bili 22.  Direct bili 14.  K <2.0.  RUQ ultrasound with 3.6 cm hypoechoic mass along the posterior margin of the left liver lobe gallbladder sludge, 1.2 cm nonshadowing stone versus larger polyp and 1.3 cm dilated CBD.  MRCP with hypoenhancing mass of pancreatic head measuring about 3.6 cm obstructing CBD near the ampulla consistent with pancreatic adenocarcinoma, hypoenhancing metastasis of the liver and cholelithiasis.  GI and oncology consulted.  Plan for ERCP on 4/25.  Subjective: Seen and examined earlier this morning.  No major events overnight of this morning.  Had an abdominal pain last night that has resolved with pain medication.  He denies nausea, vomiting, diarrhea or constipation.  Denies chest pain or dyspnea.  Objective: Vitals:   04/27/19 0441 04/27/19 1527 04/27/19 1953 04/28/19 0443  BP: 120/79 (!) 142/78 131/74 116/71  Pulse: 63 66 70 69  Resp: 20  20 20   Temp: 97.7 F (36.5 C) 98 F (36.7 C) 98 F (36.7 C) 98.1 F (36.7 C)  TempSrc: Oral Oral Oral Oral  SpO2: 98% 100% 95% 98%  Weight:      Height:        Intake/Output Summary (Last 24 hours) at 04/28/2019 1129 Last data filed at 04/28/2019 0930 Gross per 24 hour  Intake 1988.42 ml    Output 1975 ml  Net 13.42 ml   Filed Weights   04/26/19 0913  Weight: 81.2 kg    Examination:  GENERAL: No apparent distress.  Nontoxic. HEENT: MMM.  Vision and hearing grossly intact.  NECK: Supple.  No apparent JVD.  RESP:  No IWOB.  Fair aeration bilaterally. CVS:  RRR. Heart sounds normal.  ABD/GI/GU: Bowel sounds present. Soft.  Mild LLQ tenderness.  No rebound or guarding. MSK/EXT:  Moves extremities. No apparent deformity. No edema.  SKIN: no apparent skin lesion or wound NEURO: Awake, alert and oriented appropriately.  No apparent focal neuro deficit. PSYCH: Calm. Normal affect.  Procedures:  None  Microbiology summarized: COVID-19 PCR negative. GIP and C. difficile negative.  Assessment & Plan: Abnormal LFT/ALP/hyperbilirubinemia-likely due to possible pancreatic adenocarcinoma obstructing CBD-remains elevated after initial improvement.  Acute hepatitis panel and HIV negative. -Continue trending -GI and oncology following-plan for ERCP with biopsy today.  Possible pancreatic adenocarcinoma and liver mets: as noted on MRCP and RUQ ultrasound -Plan for ERCP with tissue biopsy on 4/25 -Follow CA 19-9.  Essential hypertension: Normotensive -Continue home amlodipine and Cozaar. -As needed hydralazine. -Hold home chlorthalidone in the setting of hypokalemia and hyponatremia.  History of alcohol abuse: Reportedly sober for about a month now.  No withdrawal signs. -CIWA and as needed Ativan. -Vitamins.  Tobacco abuse: -Cessation counseling -Nicotine patch  Hypokalemia: Likely due to chlorthalidone and diarrhea.  Diarrhea symptoms have subsided.. -Chlorthalidone on hold since admission. -P.o. and IV potassium.  Add low-dose Aldactone. -Recheck renal panel at noon.  Hyponatremia: Likely due to chlorthalidone.  Proving. -Continue holding chlorthalidone -Continue monitoring  Leukocytosis/bandemia: Suspect demargination.  Improving. -Continue  monitoring  Diarrhea: Likely due to pancreatic cancer.  GI panel and C. difficile negative. -As needed Imodium  History of renal cancer status post right nephrectomy in 11/2018. -Outpatient follow-up                    DVT prophylaxis: SCD.  We will start pharmacologic DVT prophylaxis after procedure. Code Status: Full code Family Communication: Patient and/or RN. Available if any question.   Discharge barrier: Evaluation for elevated liver enzymes and possible pancreatic adenocarcinoma Patient is from: Home Final disposition: To be determined  Consultants:  Gastroenterology Oncology   Sch Meds:  Scheduled Meds: . amLODipine  10 mg Oral Daily  . folic acid  1 mg Oral Daily  . losartan  100 mg Oral Daily  . multivitamin with minerals  1 tablet Oral Daily  . nicotine  21 mg Transdermal Daily  . potassium chloride  40 mEq Oral Q3H  . spironolactone  25 mg Oral Daily  . thiamine  100 mg Oral Daily   Or  . thiamine  100 mg Intravenous Daily   Continuous Infusions: . sodium chloride 75 mL/hr at 04/28/19 0925  . sodium chloride Stopped (04/27/19 1419)  . potassium chloride 10 mEq (04/28/19 1106)   PRN Meds:.sodium chloride, albuterol, alum & mag hydroxide-simeth, dextromethorphan-guaiFENesin, hydrALAZINE, HYDROmorphone (DILAUDID) injection, [DISCONTINUED] LORazepam **OR** LORazepam, LORazepam, ondansetron (ZOFRAN) IV, traMADol  Antimicrobials: Anti-infectives (From admission, onward)   None       I have personally reviewed the following labs and images: CBC: Recent Labs  Lab 04/25/19 0920 04/26/19 1033 04/27/19 0430 04/28/19 0617  WBC 12.8* 14.1* 12.9* 11.5*  NEUTROABS 9,690* 10.8*  --   --   HGB 17.5* 17.0 15.4 14.7  HCT 48.9 45.4 RESULTS UNAVAILABLE DUE TO INTERFERING SUBSTANCE RESULTS UNAVAILABLE DUE TO INTERFERING SUBSTANCE  MCV 88.6 83.0 RESULTS UNAVAILABLE DUE TO INTERFERING SUBSTANCE RESULTS UNAVAILABLE DUE TO INTERFERING SUBSTANCE  PLT 284  267 260 273   BMP &GFR Recent Labs  Lab 04/25/19 0920 04/26/19 1033 04/26/19 1655 04/27/19 0430 04/28/19 0617  NA 135 132*  --  130* 133*  K 2.4* <2.0*  --  2.4* 2.7*  CL 95* 94*  --  98 101  CO2 27 25  --  21* 22  GLUCOSE 102 118*  --  116* 95  BUN 17 18  --  19 16  CREATININE 1.38* 0.93  --  0.89 0.76  CALCIUM 9.7 9.7  --  8.9 9.0  MG  --  2.4  --  2.6* 2.3  PHOS  --   --  UNABLE TO REPORT DUE TO ICTERUS  --  UNABLE TO REPORT DUE TO ICTERUS   Estimated Creatinine Clearance: 103.3 mL/min (by C-G formula based on SCr of 0.76 mg/dL). Liver & Pancreas: Recent Labs  Lab 04/25/19 0920 04/26/19 1033 04/27/19 0430 04/28/19 0617  AST 236* 226* 200* 201*  ALT 537* 568* 472* 457*  ALKPHOS  --  600* 539* 557*  BILITOT 18.2* 21.7* 17.7* 19.2*  PROT 6.4 7.8 6.6 6.5  ALBUMIN  --  3.6 3.1* 3.1*   Recent Labs  Lab 04/26/19 1033  LIPASE 44   Recent Labs  Lab 04/26/19 1655  AMMONIA UNABLE TO REPORT DUE TO  ICTERUS    Diabetic: No results for input(s): HGBA1C in the last 72 hours. Recent Labs  Lab 04/27/19 0738 04/28/19 0004 04/28/19 0608 04/28/19 0743  GLUCAP 91 96 104* 92   Cardiac Enzymes: No results for input(s): CKTOTAL, CKMB, CKMBINDEX, TROPONINI in the last 168 hours. No results for input(s): PROBNP in the last 8760 hours. Coagulation Profile: Recent Labs  Lab 04/26/19 1033  INR 0.9   Thyroid Function Tests: No results for input(s): TSH, T4TOTAL, FREET4, T3FREE, THYROIDAB in the last 72 hours. Lipid Profile: No results for input(s): CHOL, HDL, LDLCALC, TRIG, CHOLHDL, LDLDIRECT in the last 72 hours. Anemia Panel: No results for input(s): VITAMINB12, FOLATE, FERRITIN, TIBC, IRON, RETICCTPCT in the last 72 hours. Urine analysis:    Component Value Date/Time   COLORURINE AMBER (A) 04/26/2019 1033   APPEARANCEUR CLEAR (A) 04/26/2019 1033   APPEARANCEUR Clear 02/11/2015 1004   LABSPEC 1.010 04/26/2019 1033   LABSPEC 1.005 11/07/2013 1115   PHURINE 7.0  04/26/2019 1033   GLUCOSEU NEGATIVE 04/26/2019 1033   GLUCOSEU Negative 11/07/2013 1115   HGBUR NEGATIVE 04/26/2019 1033   BILIRUBINUR MODERATE (A) 04/26/2019 1033   BILIRUBINUR Negative 02/11/2015 1004   BILIRUBINUR Negative 11/07/2013 1115   KETONESUR NEGATIVE 04/26/2019 1033   PROTEINUR NEGATIVE 04/26/2019 1033   NITRITE NEGATIVE 04/26/2019 1033   LEUKOCYTESUR NEGATIVE 04/26/2019 1033   LEUKOCYTESUR Negative 11/07/2013 1115   Sepsis Labs: Invalid input(s): PROCALCITONIN, [place]  Microbiology: Recent Results (from the past 240 hour(s))  Urine Culture     Status: None   Collection Time: 04/24/19  2:47 PM   Specimen: Urine  Result Value Ref Range Status   MICRO NUMBER: [id number]  Final   SPECIMEN QUALITY: Adequate  Final   Sample Source NOT GIVEN  Final   STATUS: FINAL  Final   Result: No Growth  Final  Respiratory Panel by RT PCR (Flu A&B, Covid) - Nasopharyngeal Swab     Status: None   Collection Time: 04/26/19  1:44 PM   Specimen: Nasopharyngeal Swab  Result Value Ref Range Status   SARS Coronavirus 2 by RT PCR NEGATIVE NEGATIVE Final    Comment: (NOTE) SARS-CoV-2 target nucleic acids are NOT DETECTED. The SARS-CoV-2 RNA is generally detectable in upper respiratoy specimens during the acute phase of infection. The lowest concentration of SARS-CoV-2 viral copies this assay can detect is 131 copies/mL. A negative result does not preclude SARS-Cov-2 infection and should not be used as the sole basis for treatment or other patient management decisions. A negative result may occur with  improper specimen collection/handling, submission of specimen other than nasopharyngeal swab, presence of viral mutation(s) within the areas targeted by this assay, and inadequate number of viral copies (<131 copies/mL). A negative result must be combined with clinical observations, patient history, and epidemiological information. The expected result is Negative. Fact Sheet for  Patients:  PinkCheek.be Fact Sheet for Healthcare Providers:  GravelBags.it This test is not yet ap proved or cleared by the [place] FDA and  has been authorized for detection and/or diagnosis of SARS-CoV-2 by FDA under an Emergency Use Authorization (EUA). This EUA will remain  in effect (meaning this test can be used) for the duration of the COVID-19 declaration under Section 564(b)(1) of the Act, 21 U.S.C. section 360bbb-3(b)(1), unless the authorization is terminated or revoked sooner.    Influenza A by PCR NEGATIVE NEGATIVE Final   Influenza B by PCR NEGATIVE NEGATIVE Final    Comment: (NOTE) The Xpert Xpress SARS-CoV-2/FLU/RSV assay  is intended as an aid in  the diagnosis of influenza from Nasopharyngeal swab specimens and  should not be used as a sole basis for treatment. Nasal washings and  aspirates are unacceptable for Xpert Xpress SARS-CoV-2/FLU/RSV  testing. Fact Sheet for Patients: PinkCheek.be Fact Sheet for Healthcare Providers: GravelBags.it This test is not yet approved or cleared by the [place] FDA and  has been authorized for detection and/or diagnosis of SARS-CoV-2 by  FDA under an Emergency Use Authorization (EUA). This EUA will remain  in effect (meaning this test can be used) for the duration of the  Covid-19 declaration under Section 564(b)(1) of the Act, 21  U.S.C. section 360bbb-3(b)(1), unless the authorization is  terminated or revoked. Performed at [facility], [place], [place] [address]   C Difficile Quick Screen w PCR reflex     Status: None   Collection Time: 04/27/19  1:04 AM   Specimen: STOOL  Result Value Ref Range Status   C Diff antigen NEGATIVE NEGATIVE Final   C Diff toxin NEGATIVE NEGATIVE Final   C Diff interpretation No C. difficile detected.  Final    Comment: Performed at [facility], [address] [place], [place] [address]  Gastrointestinal Panel by PCR , Stool     Status: None   Collection Time: 04/27/19  1:04 AM   Specimen: Stool  Result Value Ref Range Status   Campylobacter species NOT DETECTED NOT DETECTED Final   Plesimonas shigelloides NOT DETECTED NOT DETECTED Final   Salmonella species NOT DETECTED NOT DETECTED Final   Yersinia enterocolitica NOT DETECTED NOT DETECTED Final   Vibrio species NOT DETECTED NOT DETECTED Final   Vibrio cholerae NOT DETECTED NOT DETECTED Final   Enteroaggregative E coli (EAEC) NOT DETECTED NOT DETECTED Final   Enteropathogenic E coli (EPEC) NOT DETECTED NOT DETECTED Final   Enterotoxigenic E coli (ETEC) NOT DETECTED NOT DETECTED Final   Shiga like toxin producing E coli (STEC) NOT DETECTED NOT DETECTED Final   Shigella/Enteroinvasive E coli (EIEC) NOT DETECTED NOT DETECTED Final   Cryptosporidium NOT DETECTED NOT DETECTED Final   Cyclospora cayetanensis NOT DETECTED NOT DETECTED Final   Entamoeba histolytica NOT DETECTED NOT DETECTED Final   Giardia lamblia NOT DETECTED NOT DETECTED Final   Adenovirus F40/41 NOT DETECTED NOT DETECTED Final   Astrovirus NOT DETECTED NOT DETECTED Final   Norovirus GI/GII NOT DETECTED NOT DETECTED Final   Rotavirus A NOT DETECTED NOT DETECTED Final   Sapovirus (I, II, IV, and V) NOT DETECTED NOT DETECTED Final    Comment: Performed at [facility], [address], [place], [place] [address]    Radiology Studies: No results found.   Caysie Minnifield T. [place]  If 7PM-7AM, please contact night-coverage [email or web address] Password [facility] 04/28/2019, 11:29 AM

## 2019-04-29 ENCOUNTER — Inpatient Hospital Stay: Payer: BC Managed Care – PPO

## 2019-04-29 ENCOUNTER — Encounter: Payer: Self-pay | Admitting: Internal Medicine

## 2019-04-29 ENCOUNTER — Other Ambulatory Visit: Payer: Self-pay | Admitting: Oncology

## 2019-04-29 DIAGNOSIS — K838 Other specified diseases of biliary tract: Secondary | ICD-10-CM | POA: Diagnosis not present

## 2019-04-29 DIAGNOSIS — K3189 Other diseases of stomach and duodenum: Secondary | ICD-10-CM | POA: Diagnosis not present

## 2019-04-29 DIAGNOSIS — J439 Emphysema, unspecified: Secondary | ICD-10-CM | POA: Diagnosis not present

## 2019-04-29 DIAGNOSIS — C801 Malignant (primary) neoplasm, unspecified: Secondary | ICD-10-CM | POA: Diagnosis not present

## 2019-04-29 DIAGNOSIS — R945 Abnormal results of liver function studies: Secondary | ICD-10-CM | POA: Diagnosis not present

## 2019-04-29 DIAGNOSIS — R197 Diarrhea, unspecified: Secondary | ICD-10-CM | POA: Diagnosis not present

## 2019-04-29 LAB — COMPREHENSIVE METABOLIC PANEL
ALT: 379 U/L — ABNORMAL HIGH (ref 0–44)
AST: 119 U/L — ABNORMAL HIGH (ref 15–41)
Albumin: 3.1 g/dL — ABNORMAL LOW (ref 3.5–5.0)
Alkaline Phosphatase: 569 U/L — ABNORMAL HIGH (ref 38–126)
Anion gap: 9 (ref 5–15)
BUN: 14 mg/dL (ref 8–23)
CO2: 21 mmol/L — ABNORMAL LOW (ref 22–32)
Calcium: 8.7 mg/dL — ABNORMAL LOW (ref 8.9–10.3)
Chloride: 102 mmol/L (ref 98–111)
Creatinine, Ser: 0.82 mg/dL (ref 0.61–1.24)
GFR calc Af Amer: >60 mL/min (ref >60)
GFR calc non Af Amer: >60 mL/min (ref >60)
Glucose, Bld: 92 mg/dL (ref 70–99)
Potassium: 3 mmol/L — ABNORMAL LOW (ref 3.5–5.1)
Sodium: 132 mmol/L — ABNORMAL LOW (ref 135–145)
Total Bilirubin: 10.1 mg/dL — ABNORMAL HIGH (ref 0.3–1.2)
Total Protein: 6.6 g/dL (ref 6.5–8.1)

## 2019-04-29 LAB — CBC
Hemoglobin: 14.7 g/dL (ref 13.0–17.0)
Platelets: 285 10*3/uL (ref 150–400)
WBC: 11.5 10*3/uL — ABNORMAL HIGH (ref 4.0–10.5)
nRBC: 0 % (ref 0.0–0.2)

## 2019-04-29 LAB — LIPASE, BLOOD: Lipase: 65 U/L — ABNORMAL HIGH (ref 11–51)

## 2019-04-29 LAB — PHOSPHORUS: Phosphorus: 2.9 mg/dL (ref 2.5–4.6)

## 2019-04-29 LAB — CA 19-9 (SERIAL): CA 19-9: 3121 U/mL — ABNORMAL HIGH (ref 0–35)

## 2019-04-29 LAB — GLUCOSE, CAPILLARY
Glucose-Capillary: 198 mg/dL — ABNORMAL HIGH (ref 70–99)
Glucose-Capillary: 81 mg/dL (ref 70–99)
Glucose-Capillary: 99 mg/dL (ref 70–99)

## 2019-04-29 LAB — MAGNESIUM: Magnesium: 1.9 mg/dL (ref 1.7–2.4)

## 2019-04-29 MED ORDER — POTASSIUM CHLORIDE CRYS ER 20 MEQ PO TBCR
40.0000 meq | EXTENDED_RELEASE_TABLET | ORAL | Status: DC
  Administered 2019-04-29 (×2): 40 meq via ORAL
  Filled 2019-04-29 (×2): qty 2

## 2019-04-29 MED ORDER — SPIRONOLACTONE 25 MG PO TABS: 25.0000 mg | ORAL_TABLET | Freq: Every day | ORAL | 1 refills | Status: AC

## 2019-04-29 MED ORDER — ADULT MULTIVITAMIN W/MINERALS CH: 1.0000 | ORAL_TABLET | Freq: Every day | ORAL | Status: AC

## 2019-04-29 MED ORDER — IOHEXOL 300 MG/ML  SOLN
75.0000 mL | Freq: Once | INTRAMUSCULAR | Status: AC | PRN
  Administered 2019-04-29: 13:00:00 75 mL via INTRAVENOUS

## 2019-04-29 MED ORDER — POTASSIUM CHLORIDE ER 10 MEQ PO TBCR: 20.0000 meq | EXTENDED_RELEASE_TABLET | Freq: Every day | ORAL | 0 refills | Status: DC

## 2019-04-29 NOTE — Progress Notes (Signed)
Kenneth Maxwell  A and O x 4. VSS. Pt tolerating diet well. No complaints of pain or nausea. IV removed intact, prescriptions given. Pt voiced understanding of discharge instructions with no further questions. Pt discharged via wheelchair.    Allergies as of 04/29/2019      Reactions   Codeine Rash, Other (See Comments)      Medication List    STOP taking these medications   chlorthalidone 25 MG tablet Commonly known as: HYGROTON     TAKE these medications   amLODipine 10 MG tablet Commonly known as: NORVASC Take 1 tablet (10 mg total) by mouth daily.   LORazepam 0.5 MG tablet Commonly known as: ATIVAN Take 1-2 tablets 30-60 minutes prior to blood draw. Can take additional tab if needed at time of test. Must have driver to and from test. Can cause sedation.   losartan 100 MG tablet Commonly known as: COZAAR Take 1 tablet (100 mg total) by mouth daily.   multivitamin with minerals Tabs tablet Take 1 tablet by mouth daily. Start taking on: April 30, 2019   potassium chloride 10 MEQ tablet Commonly known as: KLOR-CON Take 2 tablets (20 mEq total) by mouth daily.   spironolactone 25 MG tablet Commonly known as: ALDACTONE Take 1 tablet (25 mg total) by mouth daily. Start taking on: April 30, 2019       Vitals:   04/29/19 0840 04/29/19 1155  BP: 129/76 123/68  Pulse: 67 74  Resp:  16  Temp:  98.2 F (36.8 C)  SpO2:  100%    Francesco Sor

## 2019-04-29 NOTE — Discharge Summary (Signed)
Physician Discharge Summary  Kenneth Maxwell [id number] DOB: 01/13/1955 DOA: 04/26/2019  PCP: Olin Hauser, DO  Admit date: 04/26/2019 Discharge date: 04/29/2019  Admitted From: Home Disposition: Home  Recommendations for Outpatient Follow-up:  1. Follow ups as below. 2. Please obtain CBC/CMP/Mag at follow up 3. Please follow up on the following pending results: CT chest with contrast, duodenal biopsy and CA 19-9  Home Health: None Equipment/Devices: None  Discharge Condition: Stable CODE STATUS: Full code   Hospital Course: 64 year old male with history of HTN, previous alcohol abuse, tobacco abuse, RCC s/p right nephrectomy in 11/2018 and anxiety presented with generalized weakness and "abnormal labs".  Patient had generalized weakness over 2 to 3 weeks, and dark urine.  Presented to PCP, who noticed jaundice in his eyes and did blood work.  He was found to have significant LFT with hypokalemia and directed to ED.  Sober for about a months.  In ED, hemodynamically stable.  WBC 14.1.  Lipase 44.  INR 0.9.  COVID-19 PCR negative.  ALP 600.  AST 226.  ALT 568.  Total bili 22.  Direct bili 14.  K <2.0.  RUQ ultrasound with 3.6 cm hypoechoic mass along the posterior margin of the left liver lobe gallbladder sludge, 1.2 cm nonshadowing stone versus larger polyp and 1.3 cm dilated CBD.  MRCP with hypoenhancing mass of pancreatic head measuring about 3.6 cm obstructing CBD near the ampulla consistent with pancreatic adenocarcinoma, hypoenhancing metastasis of the liver and cholelithiasis.  GI and oncology consulted.    He had an ERCP on 04/28/2019 that revealed "duodenal mass, a single localized biliary stricture in the lower third of the main bile duct and dilated the entire main bile duct.  A biliary sphincterotomy was performed.  One plastic stent was placed into the common bile duct.  Biopsy was performed of the gonadal mass."  On the day of discharge, patient tolerated  soft diet without problem.  Liver enzymes improved.  Cleared for discharge by gastroenterology and oncology.  The later recommended CT chest with contrast which was pending at the time of discharge.  Gastroenterology recommended repeat ERCP in 3 months.  Oncology to arrange outpatient follow-up.  See individual problem list below for more on hospital course.  Discharge Diagnoses:  Biliary stricture/abnormal LFT/ALP/hyperbilirubinemia-likely due to possible pancreatic adenocarcinoma obstructing CBD.  ERCP with biliary stent placement on 4/26. Acute hepatitis panel and HIV negative.  LFT improved. -Recheck CMP at follow-up -GI recommended repeat ERCP in 3 months.  Possible pancreatic adenocarcinoma and liver mets: as noted on MRCP and RUQ ultrasound.  ERCP revealed duodenal mass.  Biopsy obtained and pending. -Follow duodenal biopsy and CA 19-9. -Oncology to arrange outpatient follow-up.  Essential hypertension: Normotensive -Continue home amlodipine and Cozaar. -Added Aldactone and discontinued chlorthalidone in the setting of hypokalemia.  History of alcohol abuse: Reportedly sober for about a month now.  No withdrawal signs.  Tobacco abuse: Smokes about a pack a day.  Smoked since the age of 84.  Not ready to quit. -Discussed risk and benefits and encouraged to quit.  Hypokalemia: Likely due to chlorthalidone and diarrhea.  Diarrhea resolved.  Chlorthalidone discontinued. -Discharged on p.o. potassium and Aldactone. -Recheck BMP/electrolytes in 1 week.  Hyponatremia: Likely due to chlorthalidone.   Improving. -Chlorthalidone discontinued.  Leukocytosis/bandemia: Suspect demargination.  Improving. -Continue monitoring  Diarrhea: Likely due to pancreatic cancer.  GI panel and C. difficile negative.  Resolved.  History of renal cancer status post right nephrectomy in 11/2018. -Outpatient follow-up  Discharge Instructions  Discharge Instructions     Call MD for:  difficulty breathing, headache or visual disturbances   Complete by: As directed    Call MD for:  extreme fatigue   Complete by: As directed    Call MD for:  persistant dizziness or light-headedness   Complete by: As directed    Call MD for:  persistant nausea and vomiting   Complete by: As directed    Call MD for:  severe uncontrolled pain   Complete by: As directed    Diet - low sodium heart healthy   Complete by: As directed    Discharge instructions   Complete by: As directed    It has been a pleasure taking care of you! You were hospitalized with generalized weakness, elevated liver enzyme numbers, low potassium and low sodium.  You were diagnosed with pancreatic mass concerning for cancer.  You had a stent placed for biliary duct obstruction.  You were treated accordingly for low sodium and low potassium.  We have stopped some of your medications which could be contributing to this.  We have started you on Aldactone (spironolactone) and potassium supplementation to help with your potassium.  It is very important that you follow-up with your primary care doctor either later this week or early next week to have your levels rechecked.  Follow-up with gastroenterologist to have the stent rechecked in 3 months.  Our oncologist group will arrange outpatient follow-up to discuss about the results of pathology and plan of treatment.  Please review your new medication list and the directions before you take your medications.  Congratulations on staying sober from alcohol.  We strongly encourage you to quit smoking cigarettes as well.  You may talk to your primary care doctor for other options to help you quit smoking cigarettes.  You can also call 1-800-QUIT-NOW [phone]) for free smoking cessation counseling.    Take care,   Increase activity slowly   Complete by: As directed      Allergies as of 04/29/2019      Reactions   Codeine Rash, Other (See Comments)        Medication List    STOP taking these medications   chlorthalidone 25 MG tablet Commonly known as: HYGROTON     TAKE these medications   amLODipine 10 MG tablet Commonly known as: NORVASC Take 1 tablet (10 mg total) by mouth daily.   LORazepam 0.5 MG tablet Commonly known as: ATIVAN Take 1-2 tablets 30-60 minutes prior to blood draw. Can take additional tab if needed at time of test. Must have driver to and from test. Can cause sedation.   losartan 100 MG tablet Commonly known as: COZAAR Take 1 tablet (100 mg total) by mouth daily.   multivitamin with minerals Tabs tablet Take 1 tablet by mouth daily. Start taking on: April 30, 2019   potassium chloride 10 MEQ tablet Commonly known as: KLOR-CON Take 2 tablets (20 mEq total) by mouth daily.   spironolactone 25 MG tablet Commonly known as: ALDACTONE Take 1 tablet (25 mg total) by mouth daily. Start taking on: April 30, 2019       Consultations:  Gastroenterology  Oncology  Procedures/Studies: ERCP on 04/28/2019 that revealed "duodenal mass, a single localized biliary stricture in the lower third of the main bile duct and dilated the entire main bile duct.  A biliary sphincterotomy was performed.  One plastic stent was placed into the common bile duct.  Biopsy was performed of the  gonadal mass."  Biopsy result pending at the time of discharge.     DG Eye Foreign Body  Result Date: 04/26/2019 CLINICAL DATA:  Screening orbits for MRI EXAM: ORBITS FOR FOREIGN BODY - 2 VIEW COMPARISON:  10/31/2013 FINDINGS: Two frontal views of the orbits demonstrate no radiopaque foreign bodies. No bony abnormalities. Sinuses are clear. IMPRESSION: 1. No radiopaque foreign bodies. Electronically Signed   By: Randa Ngo M.D.   On: 04/26/2019 15:18   DG C-Arm 1-60 Min-No Report  Result Date: 04/28/2019 Fluoroscopy was utilized by the requesting physician.  No radiographic interpretation.   MR ABDOMEN MRCP W WO  CONTAST  Result Date: 04/26/2019 CLINICAL DATA:  Liver lesion, cholelithiasis, history of renal cell carcinoma and right nephrectomy EXAM: MRI ABDOMEN WITHOUT AND WITH CONTRAST (INCLUDING MRCP) TECHNIQUE: Multiplanar multisequence MR imaging of the abdomen was performed both before and after the administration of intravenous contrast. Heavily T2-weighted images of the biliary and pancreatic ducts were obtained, and three-dimensional MRCP images were rendered by post processing. CONTRAST:  7.12mL GADAVIST GADOBUTROL 1 MMOL/ML IV SOLN COMPARISON:  Abdominal ultrasound, 04/26/2019, CT abdomen pelvis, 06/30/2014, MR abdomen pelvis, 10/31/2013 FINDINGS: Lower chest: No acute findings. Hepatobiliary: There are T2 hyperintense, intrinsically T1 hypointense lesions of the subcapsular anterior right lobe of the liver adjacent to the gallbladder fossa, hepatic segment V, measuring 2.4 x 2.0 cm and of the posterior left lobe of the liver, segment III, measuring 3.7 x 2.4 cm. These lesions demonstrate central hypoenhancement with progressive rim enhancement on multiphasic contrast enhanced imaging. There are additional small, fluid signal nonenhancing cysts of the liver. Extrahepatic biliary ductal dilatation, with enlargement of the common bile duct up to 14 mm. Pancreas: There is a T2 hyperintense, slightly T1 hypointense, hypoenhancing mass of the pancreatic head measuring approximately 3.6 x 3.1 cm (series 21, image 54). This lesion is situated very laterally and near the ampulla, and does not closely abut the portal vein, superior mesenteric vein, celiac axis, superior mesenteric artery. Spleen:  Within normal limits in size and appearance. Adrenals/Urinary Tract: Status post right nephrectomy. No masses identified. No evidence of hydronephrosis. Stomach/Bowel: Visualized portions within the abdomen are unremarkable. Vascular/Lymphatic: No pathologically enlarged lymph nodes identified. No abdominal aortic aneurysm  demonstrated. Other:  None. Musculoskeletal: No suspicious bone lesions identified. IMPRESSION: 1. Hypoenhancing mass of the pancreatic head measuring approximately 3.6 cm, obstructing the common bile duct near the ampulla, consistent with pancreatic adenocarcinoma. The pancreatic duct is nondilated. 2. 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 3.  No significant lymphadenopathy. 4. Status post right nephrectomy. No evidence of malignant recurrence at the nephrectomy site. 5.  Cholelithiasis. Electronically Signed   By: Eddie Candle M.D.   On: 04/26/2019 16:56   US Abdomen Limited RUQ  Result Date: 04/26/2019 CLINICAL DATA:  Liver disease. History of a right nephrectomy for renal cell carcinoma. EXAM: ULTRASOUND ABDOMEN LIMITED RIGHT UPPER QUADRANT COMPARISON:  CT, 06/30/2014.  MRI, 10/31/2013. FINDINGS: Gallbladder: Distended. Some dependent sludge. 1.2 cm apparent nonshadowing gallstone. Several echogenic foci along the wall consistent with small polyps. The apparent stone could potentially reflect a polyp. No wall thickening. No pericholecystic fluid. Common bile duct: Diameter: 13 mm.  No visualized duct stone or sludge. Liver: Increased liver parenchymal echogenicity. Subtle nodular contour. There is a hypoechoic mass with a nearly anechoic partial rim, along the posterior left lobe, measuring 3.6 x 2.4 x 3.4 cm, not visualized on the prior MRI or CT. No other liver masses  or lesions. Portal vein is patent on color Doppler imaging with normal direction of blood flow towards the liver. Other: None. IMPRESSION: 1. 3.6 cm hypoechoic mass along the posterior margin of the left liver lobe, not visualized on the prior studies. Recommend follow-up liver MRI without and with contrast for further assessment. 2. Gallbladder sludge, probable small polyps and a 1.2 cm nonshadowing stone versus a larger polyp. No evidence of acute cholecystitis. 3. Dilated common bile duct to 1.3 cm, increased  in size compared to the prior studies. No visualized duct stone. Recommend follow-up MRCP to accompany the liver MRI, for further assessment. Electronically Signed   By: Lajean Manes M.D.   On: 04/26/2019 12:19       Discharge Exam: Vitals:   04/29/19 0414 04/29/19 0840  BP: 106/71 129/76  Pulse: 68 67  Resp: 16   Temp: 97.9 F (36.6 C)   SpO2: 98%     GENERAL: No acute distress.  Appears well.  HEENT: MMM.  Vision and hearing grossly intact.  NECK: Supple.  No apparent JVD.  RESP:  No IWOB. Good air movement bilaterally. CVS:  RRR. Heart sounds normal.  ABD/GI/GU: Bowel sounds present. Soft. Non tender.  MSK/EXT:  Moves extremities. No apparent deformity or edema.  SKIN: no apparent skin lesion or wound NEURO: Awake, alert and oriented appropriately.  No apparent focal neuro deficit. PSYCH: Calm. Normal affect.    The results of significant diagnostics from this hospitalization (including imaging, microbiology, ancillary and laboratory) are listed below for reference.     Microbiology: Recent Results (from the past 240 hour(s))  Urine Culture     Status: None   Collection Time: 04/24/19  2:47 PM   Specimen: Urine  Result Value Ref Range Status   MICRO NUMBER: [id number]  Final   SPECIMEN QUALITY: Adequate  Final   Sample Source NOT GIVEN  Final   STATUS: FINAL  Final   Result: No Growth  Final  Respiratory Panel by RT PCR (Flu A&B, Covid) - Nasopharyngeal Swab     Status: None   Collection Time: 04/26/19  1:44 PM   Specimen: Nasopharyngeal Swab  Result Value Ref Range Status   SARS Coronavirus 2 by RT PCR NEGATIVE NEGATIVE Final    Comment: (NOTE) SARS-CoV-2 target nucleic acids are NOT DETECTED. The SARS-CoV-2 RNA is generally detectable in upper respiratoy specimens during the acute phase of infection. The lowest concentration of SARS-CoV-2 viral copies this assay can detect is 131 copies/mL. A negative result does not preclude SARS-Cov-2 infection and should  not be used as the sole basis for treatment or other patient management decisions. A negative result may occur with  improper specimen collection/handling, submission of specimen other than nasopharyngeal swab, presence of viral mutation(s) within the areas targeted by this assay, and inadequate number of viral copies (<131 copies/mL). A negative result must be combined with clinical observations, patient history, and epidemiological information. The expected result is Negative. Fact Sheet for Patients:  PinkCheek.be Fact Sheet for Healthcare Providers:  GravelBags.it This test is not yet ap proved or cleared by the [place] FDA and  has been authorized for detection and/or diagnosis of SARS-CoV-2 by FDA under an Emergency Use Authorization (EUA). This EUA will remain  in effect (meaning this test can be used) for the duration of the COVID-19 declaration under Section 564(b)(1) of the Act, 21 U.S.C. section 360bbb-3(b)(1), unless the authorization is terminated or revoked sooner.    Influenza A by PCR NEGATIVE NEGATIVE  Final   Influenza B by PCR NEGATIVE NEGATIVE Final    Comment: (NOTE) The Xpert Xpress SARS-CoV-2/FLU/RSV assay is intended as an aid in  the diagnosis of influenza from Nasopharyngeal swab specimens and  should not be used as a sole basis for treatment. Nasal washings and  aspirates are unacceptable for Xpert Xpress SARS-CoV-2/FLU/RSV  testing. Fact Sheet for Patients: PinkCheek.be Fact Sheet for Healthcare Providers: GravelBags.it This test is not yet approved or cleared by the [place] FDA and  has been authorized for detection and/or diagnosis of SARS-CoV-2 by  FDA under an Emergency Use Authorization (EUA). This EUA will remain  in effect (meaning this test can be used) for the duration of the  Covid-19 declaration under Section 564(b)(1)  of the Act, 21  U.S.C. section 360bbb-3(b)(1), unless the authorization is  terminated or revoked. Performed at [facility], [place], [place] [address]   C Difficile Quick Screen w PCR reflex     Status: None   Collection Time: 04/27/19  1:04 AM   Specimen: STOOL  Result Value Ref Range Status   C Diff antigen NEGATIVE NEGATIVE Final   C Diff toxin NEGATIVE NEGATIVE Final   C Diff interpretation No C. difficile detected.  Final    Comment: Performed at [facility], [address] [place], [place] [address]  Gastrointestinal Panel by PCR , Stool     Status: None   Collection Time: 04/27/19  1:04 AM   Specimen: Stool  Result Value Ref Range Status   Campylobacter species NOT DETECTED NOT DETECTED Final   Plesimonas shigelloides NOT DETECTED NOT DETECTED Final   Salmonella species NOT DETECTED NOT DETECTED Final   Yersinia enterocolitica NOT DETECTED NOT DETECTED Final   Vibrio species NOT DETECTED NOT DETECTED Final   Vibrio cholerae NOT DETECTED NOT DETECTED Final   Enteroaggregative E coli (EAEC) NOT DETECTED NOT DETECTED Final   Enteropathogenic E coli (EPEC) NOT DETECTED NOT DETECTED Final   Enterotoxigenic E coli (ETEC) NOT DETECTED NOT DETECTED Final   Shiga like toxin producing E coli (STEC) NOT DETECTED NOT DETECTED Final   Shigella/Enteroinvasive E coli (EIEC) NOT DETECTED NOT DETECTED Final   Cryptosporidium NOT DETECTED NOT DETECTED Final   Cyclospora cayetanensis NOT DETECTED NOT DETECTED Final   Entamoeba histolytica NOT DETECTED NOT DETECTED Final   Giardia lamblia NOT DETECTED NOT DETECTED Final   Adenovirus F40/41 NOT DETECTED NOT DETECTED Final   Astrovirus NOT DETECTED NOT DETECTED Final   Norovirus GI/GII NOT DETECTED NOT DETECTED Final   Rotavirus A NOT DETECTED NOT DETECTED Final   Sapovirus (I, II, IV, and V) NOT DETECTED NOT DETECTED Final    Comment: Performed at [facility], [address]  [place],  [address]     Labs: BNP (last 3 results) No results for input(s): BNP in the last 8760 hours. Basic Metabolic Panel: Recent Labs  Lab 04/26/19 1033 04/26/19 1655 04/27/19 0430 04/28/19 0617 04/28/19 1331 04/29/19 0532  NA 132*  --  130* 133* 133* 132*  K <2.0*  --  2.4* 2.7* 3.4* 3.0*  CL 94*  --  98 101 103 102  CO2 25  --  21* 22 21* 21*  GLUCOSE 118*  --  116* 95 81 92  BUN 18  --  19 16 15 14   CREATININE 0.93  --  0.89 0.76 0.63 0.82  CALCIUM 9.7  --  8.9 9.0 9.0 8.7*  MG 2.4  --  2.6* 2.3  --  1.9  PHOS  --  UNABLE TO REPORT DUE TO ICTERUS  --  UNABLE TO REPORT DUE TO ICTERUS UNABLE TO REPORT DUE TO ICTERUS 2.9   Liver Function Tests: Recent Labs  Lab 04/25/19 0920 04/26/19 1033 04/27/19 0430 04/28/19 0617 04/28/19 1331 04/29/19 0532  AST 236* 226* 200* 201*  --  119*  ALT 537* 568* 472* 457*  --  379*  ALKPHOS  --  600* 539* 557*  --  569*  BILITOT 18.2* 21.7* 17.7* 19.2*  --  10.1*  PROT 6.4 7.8 6.6 6.5  --  6.6  ALBUMIN  --  3.6 3.1* 3.1* 3.1* 3.1*   Recent Labs  Lab 04/26/19 1033 04/29/19 0532  LIPASE 44 65*   Recent Labs  Lab 04/26/19 1655  AMMONIA UNABLE TO REPORT DUE TO ICTERUS    CBC: Recent Labs  Lab 04/25/19 0920 04/26/19 1033 04/27/19 0430 04/28/19 0617 04/29/19 0532  WBC 12.8* 14.1* 12.9* 11.5* 11.5*  NEUTROABS 9,690* 10.8*  --   --   --   HGB 17.5* 17.0 15.4 14.7 14.7  HCT 48.9 45.4 RESULTS UNAVAILABLE DUE TO INTERFERING SUBSTANCE RESULTS UNAVAILABLE DUE TO INTERFERING SUBSTANCE RESULTS UNAVAILABLE DUE TO INTERFERING SUBSTANCE  MCV 88.6 83.0 RESULTS UNAVAILABLE DUE TO INTERFERING SUBSTANCE RESULTS UNAVAILABLE DUE TO INTERFERING SUBSTANCE RESULTS UNAVAILABLE DUE TO INTERFERING SUBSTANCE  PLT 284 267 260 273 285   Cardiac Enzymes: No results for input(s): CKTOTAL, CKMB, CKMBINDEX, TROPONINI in the last 168 hours. BNP: Invalid input(s): POCBNP CBG: Recent Labs  Lab 04/28/19 0608 04/28/19 0743 04/28/19 1153  04/29/19 0031 04/29/19 0528  GLUCAP 104* 92 80 81 99   D-Dimer No results for input(s): DDIMER in the last 72 hours. Hgb A1c No results for input(s): HGBA1C in the last 72 hours. Lipid Profile No results for input(s): CHOL, HDL, LDLCALC, TRIG, CHOLHDL, LDLDIRECT in the last 72 hours. Thyroid function studies No results for input(s): TSH, T4TOTAL, T3FREE, THYROIDAB in the last 72 hours.  Invalid input(s): FREET3 Anemia work up No results for input(s): VITAMINB12, FOLATE, FERRITIN, TIBC, IRON, RETICCTPCT in the last 72 hours. Urinalysis    Component Value Date/Time   COLORURINE AMBER (A) 04/26/2019 1033   APPEARANCEUR CLEAR (A) 04/26/2019 1033   APPEARANCEUR Clear 02/11/2015 1004   LABSPEC 1.010 04/26/2019 1033   LABSPEC 1.005 11/07/2013 1115   PHURINE 7.0 04/26/2019 1033   GLUCOSEU NEGATIVE 04/26/2019 1033   GLUCOSEU Negative 11/07/2013 1115   HGBUR NEGATIVE 04/26/2019 1033   BILIRUBINUR MODERATE (A) 04/26/2019 1033   BILIRUBINUR Negative 02/11/2015 1004   BILIRUBINUR Negative 11/07/2013 1115   KETONESUR NEGATIVE 04/26/2019 1033   PROTEINUR NEGATIVE 04/26/2019 1033   NITRITE NEGATIVE 04/26/2019 1033   LEUKOCYTESUR NEGATIVE 04/26/2019 1033   LEUKOCYTESUR Negative 11/07/2013 1115   Sepsis Labs Invalid input(s): PROCALCITONIN,  WBC,  LACTICIDVEN   Time coordinating discharge: 45 minutes  SIGNED:  Mercy Riding, MD  [facility] 04/29/2019, 11:09 AM  If 7PM-7AM, please contact night-coverage [email or web address] Password [email or web address]

## 2019-04-29 NOTE — Evaluation (Signed)
Physical Therapy Evaluation Patient Details Name: Kenneth Maxwell MRN: [id number] DOB: [birth year] Today's Date: 04/29/2019   History of Present Illness  Per MD notes: Pt is a 64 year old male with history of HTN, previous alcohol abuse, tobacco abuse, RCC s/p right nephrectomy in 11/2018 and anxiety presented with generalized weakness and abnormal labs.  MD assessement includes:  Possible pancreatic adenocarcinoma and liver mets, HTN, h/o Etoh abuse, tobacco abuse, hypokalemia, hyponatremia, leukocytosis/bademia, and diarrhea.    Clinical Impression  Pt pleasant and motivated to participate during the session.  Pt was Ind with all functional mobility tasks and demonstrate good speed, effort, and control with bed mobility and transfers.  Pt was able to amb 250+ feet without an AD with good cadence and stability including during sharp turns and with start/stops.  Pt was steady with good eccentric and concentric control ascending and descending stairs without the need of a rail.  No skilled PT needs identified at this time.  Will complete PT orders at this time but will reassess pt pending a change in status upon receipt of new PT orders.      Follow Up Recommendations No PT follow up    Equipment Recommendations  None recommended by PT    Recommendations for Other Services       Precautions / Restrictions Precautions Precautions: None Restrictions Weight Bearing Restrictions: No Other Position/Activity Restrictions: Keep O2 >92%      Mobility  Bed Mobility Overal bed mobility: Independent                Transfers Overall transfer level: Independent                  Ambulation/Gait Ambulation/Gait assistance: Independent Gait Distance (Feet): 250 Feet Assistive device: None Gait Pattern/deviations: WFL(Within Functional Limits) Gait velocity: WNL   General Gait Details: Pt steady with amb including with sharp turns and start/stops  Stairs Stairs:  Yes Stairs assistance: Independent Stair Management: No rails;Forwards;Alternating pattern Number of Stairs: 4 General stair comments: Good eccentric and concentric control and stabiltiy  Wheelchair Mobility    Modified Rankin (Stroke Patients Only)       Balance Overall balance assessment: No apparent balance deficits (not formally assessed)                                           Pertinent Vitals/Pain Pain Assessment: No/denies pain    Home Living Family/patient expects to be discharged to:: Private residence Living Arrangements: Spouse/significant other Available Help at Discharge: Family   Home Access: Stairs to enter Entrance Stairs-Rails: None Entrance Stairs-Number of Steps: 2 Home Layout: One level Home Equipment: None      Prior Function Level of Independence: Independent         Comments: Ind with amb community distances without an AD, no fall history, works as a Building control surveyor, Ind with all ADLs/IADLs     Journalist, newspaper        Extremity/Trunk Assessment   Upper Extremity Assessment Upper Extremity Assessment: Overall WFL for tasks assessed    Lower Extremity Assessment Lower Extremity Assessment: Overall WFL for tasks assessed       Communication   Communication: No difficulties  Cognition Arousal/Alertness: Awake/alert Behavior During Therapy: WFL for tasks assessed/performed Overall Cognitive Status: Within Functional Limits for tasks assessed  General Comments      Exercises     Assessment/Plan    PT Assessment Patent does not need any further PT services  PT Problem List         PT Treatment Interventions      PT Goals (Current goals can be found in the Care Plan section)  Acute Rehab PT Goals PT Goal Formulation: All assessment and education complete, DC therapy    Frequency     Barriers to discharge        Co-evaluation                AM-PAC PT "6 Clicks" Mobility  Outcome Measure Help needed turning from your back to your side while in a flat bed without using bedrails?: None Help needed moving from lying on your back to sitting on the side of a flat bed without using bedrails?: None Help needed moving to and from a bed to a chair (including a wheelchair)?: None Help needed standing up from a chair using your arms (e.g., wheelchair or bedside chair)?: None Help needed to walk in hospital room?: None Help needed climbing 3-5 steps with a railing? : None 6 Click Score: 24    End of Session   Activity Tolerance: Patient tolerated treatment well Patient left: in bed;with family/visitor present;with call bell/phone within reach;Other (comment)(Pt declined up in chair) Nurse Communication: Mobility status PT Visit Diagnosis: Muscle weakness (generalized) (M62.81)    Time: [id number] PT Time Calculation (min) (ACUTE ONLY): 15 min   Charges:   PT Evaluation $PT Eval Low Complexity: 1 Low          D. Royetta Asal PT, DPT 04/29/19, 2:21 PM

## 2019-04-30 ENCOUNTER — Encounter: Payer: Self-pay | Admitting: *Deleted

## 2019-04-30 ENCOUNTER — Other Ambulatory Visit: Payer: Self-pay

## 2019-04-30 DIAGNOSIS — C259 Malignant neoplasm of pancreas, unspecified: Secondary | ICD-10-CM

## 2019-04-30 LAB — SURGICAL PATHOLOGY

## 2019-05-01 ENCOUNTER — Telehealth: Payer: Self-pay

## 2019-05-01 NOTE — Telephone Encounter (Signed)
  Patient was recently discharged from the hospital on 04/29/2019  No TCM completed, patient does not qualify for TCM services due to insurance coverage/BCBS  Scheduled for hospital follow up on 05/06/2019.

## 2019-05-02 ENCOUNTER — Other Ambulatory Visit: Payer: BC Managed Care – PPO

## 2019-05-02 ENCOUNTER — Other Ambulatory Visit: Payer: Self-pay | Admitting: Oncology

## 2019-05-02 ENCOUNTER — Telehealth: Payer: Self-pay

## 2019-05-02 ENCOUNTER — Other Ambulatory Visit: Payer: Self-pay

## 2019-05-02 DIAGNOSIS — K8689 Other specified diseases of pancreas: Secondary | ICD-10-CM

## 2019-05-02 DIAGNOSIS — C259 Malignant neoplasm of pancreas, unspecified: Secondary | ICD-10-CM

## 2019-05-02 NOTE — Telephone Encounter (Signed)
Order faxed for US guided liver bx to be scheduled.  Dr. Tasia Catchings wants patient to keep his lab appt tomorrow but will cancel the MD appt and get r/s for a few days after bx.

## 2019-05-02 NOTE — Progress Notes (Signed)
Tumor Board Documentation  Kenneth Maxwell was presented by Dr Tasia Catchings at our Tumor Board on 05/02/2019, which included representatives from medical oncology, radiation oncology, navigation, pathology, radiology, surgical oncology, internal medicine, genetics, pulmonology, palliative care, research.  Kenneth Maxwell currently presents as a new patient, for [facility] with history of the following treatments: active survellience, surgical intervention(s)(Biopsy).  Additionally, we reviewed previous medical and familial history, history of present illness, and recent lab results along with all available histopathologic and imaging studies. The tumor board considered available treatment options and made the following recommendations: Biopsy(US Guided) Refer to Genetics  The following procedures/referrals were also placed: No orders of the defined types were placed in this encounter.   Clinical Trial Status: not discussed   Staging used: Not Applicable  National site-specific guidelines   were discussed with respect to the case.  Tumor board is a meeting of clinicians from various specialty areas who evaluate and discuss patients for whom a multidisciplinary approach is being considered. Final determinations in the plan of care are those of the provider(s). The responsibility for follow up of recommendations given during tumor board is that of the provider.   Today's extended care, comprehensive team conference, Kenneth Maxwell was not present for the discussion and was not examined.   Multidisciplinary Tumor Board is a multidisciplinary case peer review process.  Decisions discussed in the Multidisciplinary Tumor Board reflect the opinions of the specialists present at the conference without having examined the patient.  Ultimately, treatment and diagnostic decisions rest with the primary provider(s) and the patient.

## 2019-05-02 NOTE — Telephone Encounter (Signed)
Done....  Pts 05/03/19 MD appt has been cx as requested

## 2019-05-03 ENCOUNTER — Inpatient Hospital Stay: Payer: BC Managed Care – PPO

## 2019-05-03 ENCOUNTER — Inpatient Hospital Stay: Payer: BC Managed Care – PPO | Attending: Oncology

## 2019-05-03 ENCOUNTER — Inpatient Hospital Stay: Payer: BC Managed Care – PPO | Admitting: Oncology

## 2019-05-03 ENCOUNTER — Other Ambulatory Visit: Payer: Self-pay

## 2019-05-03 DIAGNOSIS — E876 Hypokalemia: Secondary | ICD-10-CM | POA: Insufficient documentation

## 2019-05-03 DIAGNOSIS — R945 Abnormal results of liver function studies: Secondary | ICD-10-CM | POA: Insufficient documentation

## 2019-05-03 DIAGNOSIS — K8689 Other specified diseases of pancreas: Secondary | ICD-10-CM | POA: Insufficient documentation

## 2019-05-03 LAB — HEPATIC FUNCTION PANEL
ALT: 173 U/L — ABNORMAL HIGH (ref 0–44)
AST: 57 U/L — ABNORMAL HIGH (ref 15–41)
Albumin: 3.8 g/dL (ref 3.5–5.0)
Alkaline Phosphatase: 343 U/L — ABNORMAL HIGH (ref 38–126)
Bilirubin, Direct: 2.7 mg/dL — ABNORMAL HIGH (ref 0.0–0.2)
Indirect Bilirubin: 2.6 mg/dL — ABNORMAL HIGH (ref 0.3–0.9)
Total Bilirubin: 5.3 mg/dL — ABNORMAL HIGH (ref 0.3–1.2)
Total Protein: 7.6 g/dL (ref 6.5–8.1)

## 2019-05-06 ENCOUNTER — Ambulatory Visit: Payer: BC Managed Care – PPO | Admitting: Family Medicine

## 2019-05-06 ENCOUNTER — Other Ambulatory Visit: Payer: Self-pay

## 2019-05-06 ENCOUNTER — Encounter: Payer: Self-pay | Admitting: Family Medicine

## 2019-05-06 VITALS — BP 115/58 | HR 84 | Temp 97.5°F | Resp 16 | Ht 71.0 in | Wt 180.0 lb

## 2019-05-06 DIAGNOSIS — E876 Hypokalemia: Secondary | ICD-10-CM | POA: Diagnosis not present

## 2019-05-06 DIAGNOSIS — I10 Essential (primary) hypertension: Secondary | ICD-10-CM | POA: Diagnosis not present

## 2019-05-06 DIAGNOSIS — K831 Obstruction of bile duct: Secondary | ICD-10-CM

## 2019-05-06 DIAGNOSIS — C259 Malignant neoplasm of pancreas, unspecified: Secondary | ICD-10-CM | POA: Diagnosis not present

## 2019-05-06 DIAGNOSIS — D500 Iron deficiency anemia secondary to blood loss (chronic): Secondary | ICD-10-CM

## 2019-05-06 NOTE — Patient Instructions (Addendum)
Thank you for coming to the office today.  Stop Chlorthalidone.  Keep on the 3 BP pills - Losartan - Spironolactone (Aldactone) - Amlodipine  Finish out the Potassium. Should finish this then be done with it.  Not required to be FASTING BLOOD WORK  SCHEDULE "Lab Only" visit in the morning at the clinic for lab draw in 1 WEEK  For Lab Results, once available within 2-3 days of blood draw, you can can log in to MyChart online to view your results and a brief explanation. Also, we can discuss results at next follow-up visit.   Please schedule a Follow-up Appointment to: Return in about 1 week (around 05/13/2019) for Lab only in 1 week, then can do 3 month.  If you have any other questions or concerns, please feel free to call the office or send a message through [place]. You may also schedule an earlier appointment if necessary.  Additionally, you may be receiving a survey about your experience at our office within a few days to 1 week by e-mail or mail. We value your feedback.  Nobie Putnam, DO [place]

## 2019-05-06 NOTE — Progress Notes (Signed)
Subjective:    Patient ID: Kenneth Maxwell, male    DOB: 06-24-1955, 64 y.o.   MRN: [id number]  Kenneth Maxwell is a 64 y.o. male presenting on 05/06/2019 for Hospitalization Follow-up (pancreatic cancer, jaundice, anemia)   HPI   HOSPITAL FOLLOW-UP VISIT  Hospital/Location: [facility] Date of Admission: 04/26/19 Date of Discharge: 04/29/19 Transitions of care telephone call: 05/01/19 by Tyler Aas LPN - patient does NOT qualify for TCM.  Reason for Admission: Jaundice, Hypokalemia Primary (+Secondary) Diagnosis: Pancreatic Adenocarcinoma, obstructive jaundice, anemia  FOLLOW-UP  - Hospital H&P and Discharge Summary have been reviewed - Patient presents today about 7 days after recent hospitalization. Brief summary of recent course, patient had symptoms of jaundice, weakness, dark urine, and had abnormal labs drawn at our office low K elevated bilirubin etc, hospitalized, identified pancreatic head mass and metastatic disease of liver as well as gallstones. treated with consultation by GI and Oncology, had ERCP 04/28/19 - duodenal mass, with dilated bile duct - s/p biliary sphinecterotomy and stent placed. Discharged to outpatient follow-up with Oncology for upcoming PET Scan for staging, he had abnormal lab as well CA19, acute hepatitis panel was negative, jaundice / LFTs resolved w stent.  - Today reports overall has done well after discharge. Symptoms of jaundice, weakness have improved and nearly resolved. Admits occasional R sided episodic sharper ribcage pain at times worse positional. Improved overall Has good appetite, eating regularly.  BP meds were adjusted, however he kept taking Chlorthalidone, in addition to the new add Spironolactone  - New medications on discharge: Spironolactone 25mg  daily - Changes to current meds on discharge: DC Chlorthalidone 25mg  daily, however this was not done yet he was taking both meds.   I have reviewed the discharge medication list, and have  reconciled the current and discharge medications today.   Depression screen [facility] 2/9 04/24/2019 09/13/2018 04/05/2016  Decreased Interest 0 0 0  Down, Depressed, Hopeless 0 0 0  PHQ - 2 Score 0 0 0    Social History   Tobacco Use  . Smoking status: Current Every Day Smoker    Packs/day: 1.50    Types: Cigarettes  . Smokeless tobacco: Current User  Substance Use Topics  . Alcohol use: Yes    Alcohol/week: 49.0 standard drinks    Types: 49 Cans of beer per week  . Drug use: No    Review of Systems Per HPI unless specifically indicated above     Objective:    BP (!) 115/58   Pulse 84   Temp (!) 97.5 F (36.4 C) (Temporal)   Resp 16   Ht 5\' 11"  (1.803 m)   Wt 180 lb (81.6 kg)   SpO2 100%   BMI 25.10 kg/m   Wt Readings from Last 3 Encounters:  05/06/19 180 lb (81.6 kg)  04/26/19 179 lb (81.2 kg)  04/24/19 179 lb (81.2 kg)    Physical Exam Vitals and nursing note reviewed.  Constitutional:      General: He is not in acute distress.    Appearance: He is well-developed. He is not diaphoretic.     Comments: Well-appearing, comfortable, cooperative  HENT:     Head: Normocephalic and atraumatic.  Eyes:     General: No scleral icterus (RESOLVED).       Right eye: No discharge.        Left eye: No discharge.     Conjunctiva/sclera: Conjunctivae normal.  Neck:     Thyroid: No thyromegaly.  Cardiovascular:  Rate and Rhythm: Normal rate and regular rhythm.     Heart sounds: Normal heart sounds. No murmur.  Pulmonary:     Effort: Pulmonary effort is normal. No respiratory distress.     Breath sounds: Normal breath sounds. No wheezing or rales.  Abdominal:     General: Bowel sounds are normal. There is no distension.     Palpations: Abdomen is soft. There is no mass.     Tenderness: There is no abdominal tenderness. There is no guarding.  Musculoskeletal:        General: Normal range of motion.     Cervical back: Normal range of motion and neck supple.    Lymphadenopathy:     Cervical: No cervical adenopathy.  Skin:    General: Skin is warm and dry.     Findings: No erythema or rash.  Neurological:     Mental Status: He is alert and oriented to person, place, and time.  Psychiatric:        Behavior: Behavior normal.     Comments: Well groomed, good eye contact, normal speech and thoughts      Results for orders placed or performed in visit on 05/03/19  Hepatic function panel  Result Value Ref Range   Total Protein 7.6 6.5 - 8.1 g/dL   Albumin 3.8 3.5 - 5.0 g/dL   AST 57 (H) 15 - 41 U/L   ALT 173 (H) 0 - 44 U/L   Alkaline Phosphatase 343 (H) 38 - 126 U/L   Total Bilirubin 5.3 (H) 0.3 - 1.2 mg/dL   Bilirubin, Direct 2.7 (H) 0.0 - 0.2 mg/dL   Indirect Bilirubin 2.6 (H) 0.3 - 0.9 mg/dL      Assessment & Plan:   Problem List Items Addressed This Visit    Pancreatic adenocarcinoma ([place]) - Primary   Relevant Orders   COMPLETE METABOLIC PANEL WITH GFR   CBC with Differential/Platelet   Obstructive jaundice   Hypokalemia   Relevant Orders   COMPLETE METABOLIC PANEL WITH GFR   Magnesium   Hypertension   Relevant Orders   COMPLETE METABOLIC PANEL WITH GFR    Other Visit Diagnoses    Iron deficiency anemia due to chronic blood loss          #Pancreatic Adenocarcinoma Obstructive jaundice HTN Anemia - In process of staging / management by [facility] Oncology, awaiting PET Scan and biopsy results - Improved per GI placement of biliary stent, after ERCP, improved jaundice, LFTs - Had improved K, however meds were not correct, he was taking ALL Bp meds didn't DC Chlorthalidone could have lowered his K more, he was placed on Spiro and Ksupplement. - Advise now finish K Supplement only short term course. He should DISCONTINUE Chlorthalidone. - Continue Losartan, Spironolactone, Amlodipine - We will defer K check since on incorrect meds, will check in 1 week return here for lab, also check CBC, Mag.    No orders of the defined  types were placed in this encounter.   Follow up plan: Return in about 1 week (around 05/13/2019) for Lab only in 1 week, then can do 3 month.  Future labs ordered for 05/13/19 - CMET CBC Magnesium, defer labs 1 week  Nobie Putnam, DO [facility] 05/06/2019, 11:41 AM

## 2019-05-07 ENCOUNTER — Ambulatory Visit
Admission: RE | Admit: 2019-05-07 | Discharge: 2019-05-07 | Disposition: A | Discharge status: Home / Self Care | Payer: BC Managed Care – PPO | Source: Ambulatory Visit | Attending: Oncology | Admitting: Oncology

## 2019-05-07 ENCOUNTER — Other Ambulatory Visit: Payer: Self-pay

## 2019-05-07 DIAGNOSIS — J439 Emphysema, unspecified: Secondary | ICD-10-CM | POA: Insufficient documentation

## 2019-05-07 DIAGNOSIS — C259 Malignant neoplasm of pancreas, unspecified: Secondary | ICD-10-CM | POA: Diagnosis not present

## 2019-05-07 DIAGNOSIS — K8020 Calculus of gallbladder without cholecystitis without obstruction: Secondary | ICD-10-CM | POA: Diagnosis not present

## 2019-05-07 DIAGNOSIS — I700 Atherosclerosis of aorta: Secondary | ICD-10-CM | POA: Insufficient documentation

## 2019-05-07 DIAGNOSIS — C772 Secondary and unspecified malignant neoplasm of intra-abdominal lymph nodes: Secondary | ICD-10-CM | POA: Diagnosis not present

## 2019-05-07 DIAGNOSIS — Z905 Acquired absence of kidney: Secondary | ICD-10-CM | POA: Insufficient documentation

## 2019-05-07 DIAGNOSIS — I2510 Atherosclerotic heart disease of native coronary artery without angina pectoris: Secondary | ICD-10-CM | POA: Diagnosis not present

## 2019-05-07 DIAGNOSIS — C787 Secondary malignant neoplasm of liver and intrahepatic bile duct: Secondary | ICD-10-CM | POA: Diagnosis not present

## 2019-05-07 DIAGNOSIS — Z85528 Personal history of other malignant neoplasm of kidney: Secondary | ICD-10-CM | POA: Diagnosis not present

## 2019-05-07 LAB — GLUCOSE, CAPILLARY: Glucose-Capillary: 103 mg/dL — ABNORMAL HIGH (ref 70–99)

## 2019-05-07 MED ORDER — FLUDEOXYGLUCOSE F - 18 (FDG) INJECTION
9.3000 | Freq: Once | INTRAVENOUS | Status: AC | PRN
  Administered 2019-05-07: 9.39 via INTRAVENOUS

## 2019-05-08 NOTE — Progress Notes (Signed)
Patient on schedule for 05/10/2019 for liver biopsy, spoke with patient, made aware to be here @ 1000, NPO after 0200, and bp meds with sip of water, and driver to take home after discharge. Stated understanding.

## 2019-05-08 NOTE — Telephone Encounter (Signed)
Bx scheduled for 05/10/19 @ 11:00 arrive at 10:00.  Follow up with Dr. Tasia Catchings is scheduled for 05/17/19 @ 10:45.  Will get him scheduled sooner if the results come in before 5/14.  Patient notified.

## 2019-05-09 ENCOUNTER — Other Ambulatory Visit: Payer: Self-pay | Admitting: Radiology

## 2019-05-10 ENCOUNTER — Other Ambulatory Visit: Payer: Self-pay

## 2019-05-10 ENCOUNTER — Ambulatory Visit
Admission: RE | Admit: 2019-05-10 | Discharge: 2019-05-10 | Disposition: A | Discharge status: Home / Self Care | Payer: BC Managed Care – PPO | Source: Ambulatory Visit | Attending: Oncology | Admitting: Oncology

## 2019-05-10 DIAGNOSIS — J439 Emphysema, unspecified: Secondary | ICD-10-CM | POA: Diagnosis not present

## 2019-05-10 DIAGNOSIS — F17210 Nicotine dependence, cigarettes, uncomplicated: Secondary | ICD-10-CM | POA: Diagnosis not present

## 2019-05-10 DIAGNOSIS — C772 Secondary and unspecified malignant neoplasm of intra-abdominal lymph nodes: Secondary | ICD-10-CM | POA: Diagnosis not present

## 2019-05-10 DIAGNOSIS — C259 Malignant neoplasm of pancreas, unspecified: Secondary | ICD-10-CM | POA: Diagnosis not present

## 2019-05-10 DIAGNOSIS — Z8249 Family history of ischemic heart disease and other diseases of the circulatory system: Secondary | ICD-10-CM | POA: Insufficient documentation

## 2019-05-10 DIAGNOSIS — R001 Bradycardia, unspecified: Secondary | ICD-10-CM | POA: Diagnosis not present

## 2019-05-10 DIAGNOSIS — I700 Atherosclerosis of aorta: Secondary | ICD-10-CM | POA: Insufficient documentation

## 2019-05-10 DIAGNOSIS — Z85528 Personal history of other malignant neoplasm of kidney: Secondary | ICD-10-CM | POA: Diagnosis not present

## 2019-05-10 DIAGNOSIS — Z905 Acquired absence of kidney: Secondary | ICD-10-CM | POA: Diagnosis not present

## 2019-05-10 DIAGNOSIS — Z79899 Other long term (current) drug therapy: Secondary | ICD-10-CM | POA: Diagnosis not present

## 2019-05-10 DIAGNOSIS — K7689 Other specified diseases of liver: Secondary | ICD-10-CM | POA: Diagnosis not present

## 2019-05-10 DIAGNOSIS — I129 Hypertensive chronic kidney disease with stage 1 through stage 4 chronic kidney disease, or unspecified chronic kidney disease: Secondary | ICD-10-CM | POA: Insufficient documentation

## 2019-05-10 DIAGNOSIS — R160 Hepatomegaly, not elsewhere classified: Secondary | ICD-10-CM | POA: Insufficient documentation

## 2019-05-10 DIAGNOSIS — N189 Chronic kidney disease, unspecified: Secondary | ICD-10-CM | POA: Insufficient documentation

## 2019-05-10 DIAGNOSIS — C787 Secondary malignant neoplasm of liver and intrahepatic bile duct: Secondary | ICD-10-CM | POA: Diagnosis not present

## 2019-05-10 LAB — CBC
HCT: 43.1 % (ref 39.0–52.0)
Hemoglobin: 15.2 g/dL (ref 13.0–17.0)
MCH: 30.9 pg (ref 26.0–34.0)
MCHC: 35.3 g/dL (ref 30.0–36.0)
MCV: 87.6 fL (ref 80.0–100.0)
Platelets: 399 10*3/uL (ref 150–400)
RBC: 4.92 MIL/uL (ref 4.22–5.81)
RDW: 15.2 % (ref 11.5–15.5)
WBC: 14.5 10*3/uL — ABNORMAL HIGH (ref 4.0–10.5)
nRBC: 0 % (ref 0.0–0.2)

## 2019-05-10 LAB — PROTIME-INR
INR: 0.9 (ref 0.8–1.2)
Prothrombin Time: 11.8 seconds (ref 11.4–15.2)

## 2019-05-10 LAB — GLUCOSE, CAPILLARY
Glucose-Capillary: 132 mg/dL — ABNORMAL HIGH (ref 70–99)
Glucose-Capillary: 82 mg/dL (ref 70–99)

## 2019-05-10 MED ORDER — ATROPINE SULFATE 1 MG/10ML IJ SOSY
PREFILLED_SYRINGE | INTRAMUSCULAR | Status: AC
  Administered 2019-05-10: 0.5 mg via INTRAVENOUS
  Filled 2019-05-10: qty 10

## 2019-05-10 MED ORDER — SODIUM CHLORIDE 0.9 % IV SOLN: INTRAVENOUS | Status: DC

## 2019-05-10 MED ORDER — ATROPINE SULFATE 1 MG/10ML IJ SOSY: 0.5000 mg | PREFILLED_SYRINGE | Freq: Once | INTRAMUSCULAR | Status: AC

## 2019-05-10 MED ORDER — SODIUM CHLORIDE 0.9 % IV BOLUS: 500.0000 mL | Freq: Once | INTRAVENOUS | Status: DC

## 2019-05-10 MED ORDER — MIDAZOLAM HCL 2 MG/2ML IJ SOLN
INTRAMUSCULAR | Status: AC | PRN
  Administered 2019-05-10 (×2): 0.5 mg via INTRAVENOUS

## 2019-05-10 MED ORDER — FENTANYL CITRATE (PF) 100 MCG/2ML IJ SOLN
INTRAMUSCULAR | Status: AC
  Filled 2019-05-10: qty 2

## 2019-05-10 MED ORDER — DEXTROSE 50 % IV SOLN
INTRAVENOUS | Status: AC
  Administered 2019-05-10: 11:00:00 25 mL via INTRAVENOUS
  Filled 2019-05-10: qty 50

## 2019-05-10 MED ORDER — FENTANYL CITRATE (PF) 100 MCG/2ML IJ SOLN
INTRAMUSCULAR | Status: AC | PRN
  Administered 2019-05-10 (×3): 25 ug via INTRAVENOUS

## 2019-05-10 MED ORDER — DEXTROSE 50 % IV SOLN: 0.5000 | Freq: Once | INTRAVENOUS | Status: AC

## 2019-05-10 MED ORDER — MIDAZOLAM HCL 2 MG/2ML IJ SOLN
INTRAMUSCULAR | Status: AC
  Filled 2019-05-10: qty 2

## 2019-05-10 NOTE — H&P (Signed)
Chief Complaint: Pancreatic mass with liver lesion  Referring Physician(s): Yu,Zhou  Supervising Physician: Aletta Edouard  Patient Status: [facility] - Out-pt  History of Present Illness: Kenneth Maxwell is a 64 y.o. male who was hospitalized from 04/26/19-04/29/19 with jaundice, weakness, and dark urine.  MR done 04/26/2019 showed a pancreatic head mass and metastatic disease of liver.  He underwent ERCP 04/28/19 = duodenal mass with dilated bile duct - s/p biliary sphinecterotomy and stent placed.   CT done 04/29/19 showed = 5 mm nodular density is noted in right lower lobe just above the diaphragm. Given the recent diagnosis of pancreatic and hepatic masses, metastatic disease cannot be excluded and follow-up chest CT in 3 months is recommended. Liver mass and pancreatic mass are again noted as described on recent MRI.    PET scan done 05/07/19 showed= Hypermetabolic pancreatic primary with bilateral hepatic and peripancreatic nodal metastasis.     He is here today for image guided biopsy of the left lobe liver lesion.  He is NPO. No nausea/vomiting. No Fever/chills. ROS negative.  No blood thinners.  Past Medical History:  Diagnosis Date  . Alcohol abuse   . Arthritis   . Blood clotting disorder ([place])   . Chronic renal disease   . H/O urinary retention   . Hematuria, gross   . Hypertension   . Renal cell carcinoma ([place]) 11/2013   kidney cancer s/p R nephrectomy  . Right renal mass     Past Surgical History:  Procedure Laterality Date  . ERCP N/A 04/28/2019   Procedure: ENDOSCOPIC RETROGRADE CHOLANGIOPANCREATOGRAPHY (ERCP);  Surgeon: Lucilla Lame, MD;  Location: [facility] ENDOSCOPY;  Service: Endoscopy;  Laterality: N/A;  . NEPHRECTOMY RADICAL Right 11/2013   R side kidney cancer surgery, non adrenal sparing    Allergies: Codeine  Medications: Prior to Admission medications   Medication Sig Start Date End Date Taking? Authorizing Provider  amLODipine  (NORVASC) 10 MG tablet Take 1 tablet (10 mg total) by mouth daily. 09/13/18   Karamalegos, Devonne Doughty, DO  losartan (COZAAR) 100 MG tablet Take 1 tablet (100 mg total) by mouth daily. 09/13/18   Karamalegos, Devonne Doughty, DO  Multiple Vitamin (MULTIVITAMIN WITH MINERALS) TABS tablet Take 1 tablet by mouth daily. 04/30/19   Mercy Riding, MD  spironolactone (ALDACTONE) 25 MG tablet Take 1 tablet (25 mg total) by mouth daily. 04/30/19   Mercy Riding, MD     Family History  Problem Relation Age of Onset  . Hypertension Mother   . Diabetes Mother   . Hypertension Father   . Heart attack Father     Social History   Socioeconomic History  . Marital status: Married    Spouse name: Not on file  . Number of children: Not on file  . Years of education: Not on file  . Highest education level: Not on file  Occupational History  . Not on file  Tobacco Use  . Smoking status: Current Every Day Smoker    Packs/day: 1.50    Types: Cigarettes  . Smokeless tobacco: Current User  Substance and Sexual Activity  . Alcohol use: Yes    Alcohol/week: 49.0 standard drinks    Types: 49 Cans of beer per week  . Drug use: No  . Sexual activity: Yes  Other Topics Concern  . Not on file  Social History Narrative  . Not on file   Social Determinants of Health   Financial Resource Strain:   . Difficulty  of Paying Living Expenses:   Food Insecurity:   . Worried About Charity fundraiser in the Last Year:   . Arboriculturist in the Last Year:   Transportation Needs:   . Film/video editor (Medical):   Marland Kitchen Lack of Transportation (Non-Medical):   Physical Activity:   . Days of Exercise per Week:   . Minutes of Exercise per Session:   Stress:   . Feeling of Stress :   Social Connections:   . Frequency of Communication with Friends and Family:   . Frequency of Social Gatherings with Friends and Family:   . Attends Religious Services:   . Active Member of Clubs or Organizations:   . Attends English as a second language teacher Meetings:   Marland Kitchen Marital Status:      Review of Systems: A 12 point ROS discussed and pertinent positives are indicated in the HPI above.  All other systems are negative.  Review of Systems  Vital Signs: BP 118/75   Pulse 77   Temp 98.1 F (36.7 C) (Oral)   Resp 18   Ht 5\' 11"  (1.803 m)   Wt 81.6 kg   SpO2 100%   BMI 25.10 kg/m   Physical Exam Vitals reviewed.  Constitutional:      Appearance: Normal appearance.  HENT:     Head: Normocephalic and atraumatic.  Eyes:     Extraocular Movements: Extraocular movements intact.  Cardiovascular:     Rate and Rhythm: Normal rate and regular rhythm.  Pulmonary:     Effort: Pulmonary effort is normal. No respiratory distress.     Breath sounds: Normal breath sounds.  Abdominal:     General: There is no distension.     Palpations: Abdomen is soft.     Tenderness: There is no abdominal tenderness.  Musculoskeletal:        General: Normal range of motion.     Cervical back: Normal range of motion.  Skin:    General: Skin is warm and dry.  Neurological:     General: No focal deficit present.     Mental Status: He is alert and oriented to person, place, and time.  Psychiatric:        Mood and Affect: Mood normal.        Behavior: Behavior normal.        Thought Content: Thought content normal.        Judgment: Judgment normal.     Imaging: DG Eye Foreign Body  Result Date: 04/26/2019 CLINICAL DATA:  Screening orbits for MRI EXAM: ORBITS FOR FOREIGN BODY - 2 VIEW COMPARISON:  10/31/2013 FINDINGS: Two frontal views of the orbits demonstrate no radiopaque foreign bodies. No bony abnormalities. Sinuses are clear. IMPRESSION: 1. No radiopaque foreign bodies. Electronically Signed   By: Randa Ngo M.D.   On: 04/26/2019 15:18   CT CHEST W CONTRAST  Result Date: 04/29/2019 CLINICAL DATA:  Cancer of unknown primary. EXAM: CT CHEST WITH CONTRAST TECHNIQUE: Multidetector CT imaging of the chest was performed during  intravenous contrast administration. CONTRAST:  68mL OMNIPAQUE IOHEXOL 300 MG/ML  SOLN COMPARISON:  April 26, 2019. November 11, 2013. FINDINGS: Cardiovascular: Atherosclerosis of thoracic aorta is noted without aneurysm or dissection. Normal cardiac size. No pericardial effusion. Mild coronary calcifications are noted. Mediastinum/Nodes: No enlarged mediastinal, hilar, or axillary lymph nodes. Thyroid gland, trachea, and esophagus demonstrate no significant findings. Lungs/Pleura: No pneumothorax or pleural effusion is noted. Mild emphysematous disease is noted in the upper lobes. 5  mm nodular density is seen in the right lower lobe just above the diaphragm best seen on image number 111 of series 3. Upper Abdomen: Liver mass and pancreatic mass are again noted as described on recent MRI. Musculoskeletal: No chest wall abnormality. No acute or significant osseous findings. IMPRESSION: 1. 5 mm nodular density is noted in right lower lobe just above the diaphragm. Given the recent diagnosis of pancreatic and hepatic masses, metastatic disease cannot be excluded and follow-up chest CT in 3 months is recommended. 2. Mild coronary calcifications are noted. 3. Liver mass and pancreatic mass are again noted as described on recent MRI. Aortic Atherosclerosis (ICD10-I70.0) and Emphysema (ICD10-J43.9). Electronically Signed   By: Marijo Conception M.D.   On: 04/29/2019 13:31   NM PET Image Initial (PI) Skull Base To Thigh  Result Date: 05/07/2019 CLINICAL DATA:  Initial treatment strategy for pancreatic cancer. History of renal cell carcinoma in 2016 with nephrectomy. EXAM: NUCLEAR MEDICINE PET SKULL BASE TO THIGH TECHNIQUE: 9.4 mCi F-18 FDG was injected intravenously. Full-ring PET imaging was performed from the skull base to thigh after the radiotracer. CT data was obtained and used for attenuation correction and anatomic localization. Fasting blood glucose: 103 mg/dl COMPARISON:  04/29/2019 chest CT.  04/26/2019 abdominal  MRI. FINDINGS: Mediastinal blood pool activity: SUV max 2.4 Liver activity: SUV max NA NECK: No areas of abnormal hypermetabolism. Incidental CT findings: Bilateral carotid atherosclerosis. No cervical adenopathy. CHEST: No pulmonary parenchymal or thoracic nodal hypermetabolism. Incidental CT findings: Deferred to recent diagnostic CT. Aortic and coronary artery atherosclerosis. Centrilobular and paraseptal emphysema. ABDOMEN/PELVIS: Segment 2 lesion measures 2.9 cm and a S.U.V. max of 9.4 on 151/2. Segment 5 lesion measures 2.0 cm and a S.U.V. max of 4.8 on 179/3. Pancreatic head mass measures on the order of 3.5 cm and a S.U.V. max of 4.8 on 178/3. A node adjacent the SMA measures 8 mm and a S.U.V. max of 4.0 on 181/3. Gastric cardia underdistention and hypermetabolism, including at a S.U.V. max of 7.3 on 136/3. Incidental CT findings: Left adrenal thickening is low-density, suggesting hyperplasia are adenoma. Left renal vascular calcification. Right nephrectomy without local recurrence. Common duct stent in place. Small gallstone. Abdominal aortic atherosclerosis. SKELETON: No abnormal marrow activity. Incidental CT findings: Degenerative changes of both hips. IMPRESSION: 1. Hypermetabolic pancreatic primary with bilateral hepatic and peripancreatic nodal metastasis. 2. No evidence of hypermetabolic extraabdominal metastatic disease. 3. Right nephrectomy, without locally recurrent disease. 4. Proximal gastric hypermetabolism could be physiologic or represent gastritis. 5. Incidental findings, including: Aortic atherosclerosis (ICD10-I70.0), coronary artery atherosclerosis and emphysema (ICD10-J43.9). Cholelithiasis. Electronically Signed   By: Abigail Miyamoto M.D.   On: 05/07/2019 13:33   DG C-Arm 1-60 Min-No Report  Result Date: 04/28/2019 Fluoroscopy was utilized by the requesting physician.  No radiographic interpretation.   MR ABDOMEN MRCP W WO CONTAST  Result Date: 04/26/2019 CLINICAL DATA:  Liver  lesion, cholelithiasis, history of renal cell carcinoma and right nephrectomy EXAM: MRI ABDOMEN WITHOUT AND WITH CONTRAST (INCLUDING MRCP) TECHNIQUE: Multiplanar multisequence MR imaging of the abdomen was performed both before and after the administration of intravenous contrast. Heavily T2-weighted images of the biliary and pancreatic ducts were obtained, and three-dimensional MRCP images were rendered by post processing. CONTRAST:  7.23mL GADAVIST GADOBUTROL 1 MMOL/ML IV SOLN COMPARISON:  Abdominal ultrasound, 04/26/2019, CT abdomen pelvis, 06/30/2014, MR abdomen pelvis, 10/31/2013 FINDINGS: Lower chest: No acute findings. Hepatobiliary: There are T2 hyperintense, intrinsically T1 hypointense lesions of the subcapsular anterior right lobe of the  liver adjacent to the gallbladder fossa, hepatic segment V, measuring 2.4 x 2.0 cm and of the posterior left lobe of the liver, segment III, measuring 3.7 x 2.4 cm. These lesions demonstrate central hypoenhancement with progressive rim enhancement on multiphasic contrast enhanced imaging. There are additional small, fluid signal nonenhancing cysts of the liver. Extrahepatic biliary ductal dilatation, with enlargement of the common bile duct up to 14 mm. Pancreas: There is a T2 hyperintense, slightly T1 hypointense, hypoenhancing mass of the pancreatic head measuring approximately 3.6 x 3.1 cm (series 21, image 54). This lesion is situated very laterally and near the ampulla, and does not closely abut the portal vein, superior mesenteric vein, celiac axis, superior mesenteric artery. Spleen:  Within normal limits in size and appearance. Adrenals/Urinary Tract: Status post right nephrectomy. No masses identified. No evidence of hydronephrosis. Stomach/Bowel: Visualized portions within the abdomen are unremarkable. Vascular/Lymphatic: No pathologically enlarged lymph nodes identified. No abdominal aortic aneurysm demonstrated. Other:  None. Musculoskeletal: No suspicious bone  lesions identified. IMPRESSION: 1. Hypoenhancing mass of the pancreatic head measuring approximately 3.6 cm, obstructing the common bile duct near the ampulla, consistent with pancreatic adenocarcinoma. The pancreatic duct is nondilated. 2. 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 3.  No significant lymphadenopathy. 4. Status post right nephrectomy. No evidence of malignant recurrence at the nephrectomy site. 5.  Cholelithiasis. Electronically Signed   By: Eddie Candle M.D.   On: 04/26/2019 16:56   US Abdomen Limited RUQ  Result Date: 04/26/2019 CLINICAL DATA:  Liver disease. History of a right nephrectomy for renal cell carcinoma. EXAM: ULTRASOUND ABDOMEN LIMITED RIGHT UPPER QUADRANT COMPARISON:  CT, 06/30/2014.  MRI, 10/31/2013. FINDINGS: Gallbladder: Distended. Some dependent sludge. 1.2 cm apparent nonshadowing gallstone. Several echogenic foci along the wall consistent with small polyps. The apparent stone could potentially reflect a polyp. No wall thickening. No pericholecystic fluid. Common bile duct: Diameter: 13 mm.  No visualized duct stone or sludge. Liver: Increased liver parenchymal echogenicity. Subtle nodular contour. There is a hypoechoic mass with a nearly anechoic partial rim, along the posterior left lobe, measuring 3.6 x 2.4 x 3.4 cm, not visualized on the prior MRI or CT. No other liver masses or lesions. Portal vein is patent on color Doppler imaging with normal direction of blood flow towards the liver. Other: None. IMPRESSION: 1. 3.6 cm hypoechoic mass along the posterior margin of the left liver lobe, not visualized on the prior studies. Recommend follow-up liver MRI without and with contrast for further assessment. 2. Gallbladder sludge, probable small polyps and a 1.2 cm nonshadowing stone versus a larger polyp. No evidence of acute cholecystitis. 3. Dilated common bile duct to 1.3 cm, increased in size compared to the prior studies. No visualized duct stone.  Recommend follow-up MRCP to accompany the liver MRI, for further assessment. Electronically Signed   By: Lajean Manes M.D.   On: 04/26/2019 12:19    Labs:  CBC: Recent Labs    04/26/19 1033 04/27/19 0430 04/28/19 0617 04/29/19 0532  WBC 14.1* 12.9* 11.5* 11.5*  HGB 17.0 15.4 14.7 14.7  HCT 45.4 RESULTS UNAVAILABLE DUE TO INTERFERING SUBSTANCE RESULTS UNAVAILABLE DUE TO INTERFERING SUBSTANCE RESULTS UNAVAILABLE DUE TO INTERFERING SUBSTANCE  PLT 267 260 273 285    COAGS: Recent Labs    04/26/19 1033 04/26/19 1655  INR 0.9  --   APTT  --  <24*    BMP: Recent Labs    04/27/19 0430 04/28/19 0617 04/28/19 1331 04/29/19 0532  NA 130* 133* 133* 132*  K 2.4* 2.7* 3.4* 3.0*  CL 98 101 103 102  CO2 21* 22 21* 21*  GLUCOSE 116* 95 81 92  BUN 19 16 15 14   CALCIUM 8.9 9.0 9.0 8.7*  CREATININE 0.89 0.76 0.63 0.82  GFRNONAA >60 >60 >60 >60  GFRAA >60 >60 >60 >60    LIVER FUNCTION TESTS: Recent Labs    04/27/19 0430 04/27/19 0430 04/28/19 0617 04/28/19 1331 04/29/19 0532 05/03/19 0958  BILITOT 17.7*  --  19.2*  --  10.1* 5.3*  AST 200*  --  201*  --  119* 57*  ALT 472*  --  457*  --  379* 173*  ALKPHOS 539*  --  557*  --  569* 343*  PROT 6.6  --  6.5  --  6.6 7.6  ALBUMIN 3.1*   < > 3.1* 3.1* 3.1* 3.8   < > = values in this interval not displayed.    TUMOR MARKERS: No results for input(s): AFPTM, CEA, CA199, CHROMGRNA in the last 8760 hours.  Assessment and Plan:  Liver mass and pancreatic mass worrisome for metastatic pancreatic cancer.  Will proceed with image guided biopsy of the left lobe liver mass today by Dr. Kathlene Cote.  Risks and benefits of liver lesion biopsy was discussed with the patient and/or patient's family including, but not limited to bleeding, infection, damage to adjacent structures or low yield requiring additional tests.  All of the questions were answered and there is agreement to proceed.  Consent signed and in chart.  Thank you  for this interesting consult.  I greatly enjoyed meeting Kenneth Maxwell and look forward to participating in their care.  A copy of this report was sent to the requesting provider on this date.  Electronically Signed: Murrell Redden, PA-C   05/10/2019, 10:23 AM      I spent a total of  30 Minutes in face to face in clinical consultation, greater than 50% of which was counseling/coordinating care for liver lesion biopsy.

## 2019-05-10 NOTE — Procedures (Signed)
Interventional Radiology Procedure Note  Procedure: US Guided Biopsy of Liver  Complications: None  Estimated Blood Loss: < 10 mL  Findings: 18 G core biopsy of left lobe liver lesion performed under US guidance.  Three core samples obtained and sent to Pathology.  Venetia Night. Kathlene Cote, M.D Pager:  [phone]

## 2019-05-10 NOTE — Progress Notes (Signed)
Patient clinically stable post Liver biopsy per Dr Kathlene Cote, tolerated well. No further episodes of bradycardia. Denies complaints at this time, eating lunch with coffee, received Versed 1mg  along with Fentanyl 62mcg IV for procedure. Report given to [facility] with questions answered.dressing dry/intact to mid abdomen.will continue to monitor post procedure,d/c in 2hours as remains stable.

## 2019-05-10 NOTE — Discharge Instructions (Signed)
Liver Biopsy, Care After These instructions give you information about how to care for yourself after your procedure. Your health care provider may also give you more specific instructions. If you have problems or questions, contact your health care provider. What can I expect after the procedure? After your procedure, it is common to have:  Pain and soreness in the area where the biopsy was done.  Bruising around the area where the biopsy was done.  Sleepiness and fatigue for 1-2 days. Follow these instructions at home: Medicines  Take over-the-counter and prescription medicines only as told by your health care provider.  If you were prescribed an antibiotic medicine, take it as told by your health care provider. Do not stop taking the antibiotic even if you start to feel better.  Do not take medicines such as aspirin and ibuprofen unless your health care provider tells you to take them. These medicines thin your blood and can increase the risk of bleeding.  If you are taking prescription pain medicine, take actions to prevent or treat constipation. Your health care provider may recommend that you: ? Drink enough fluid to keep your urine pale yellow. ? Eat foods that are high in fiber, such as fresh fruits and vegetables, whole grains, and beans. ? Limit foods that are high in fat and processed sugars, such as fried or sweet foods. ? Take an over-the-counter or prescription medicine for constipation. Incision care  Follow instructions from your health care provider about how to take care of your incision. Make sure you: ? Wash your hands with soap and water before you change your bandage (dressing). If soap and water are not available, use hand sanitizer. ? Change your dressing as told by your health care provider. ? Leave stitches (sutures), skin glue, or adhesive strips in place. These skin closures may need to stay in place for 2 weeks or longer. If adhesive strip edges start to  loosen and curl up, you may trim the loose edges. Do not remove adhesive strips completely unless your health care provider tells you to do that.  Check your incision area every day for signs of infection. Check for: ? Redness, swelling, or pain. ? Fluid or blood. ? Warmth. ? Pus or a bad smell.  Do not take baths, swim, or use a hot tub until your health care provider says it is okay to do so. Activity   Rest at home for 1-2 days, or as directed by your health care provider. ? Avoid sitting for a long time without moving. Get up to take short walks every 1-2 hours. This is important to improve blood flow and breathing. Ask for help if you feel weak or unsteady.  Return to your normal activities as told by your health care provider. Ask your health care provider what activities are safe for you.  Do not drive or use heavy machinery while taking prescription pain medicine.  Do not lift anything that is heavier than 10 lb (4.5 kg), or the limit that your health care provider tells you, until he or she says that it is safe.  Do not play contact sports for 2 weeks after the procedure. General instructions   Do not drink alcohol in the first week after the procedure.  Have someone stay with you for at least 24 hours after the procedure.  It is your responsibility to obtain your test results. Ask your health care provider, or the department that is doing the test: ? When will my   results be ready? ? How will I get my results? ? What are my treatment options? ? What other tests do I need? ? What are my next steps?  Keep all follow-up visits as told by your health care provider. This is important. Contact a health care provider if:  You have increased bleeding from an incision, resulting in more than a small spot of blood.  You have redness, swelling, or increasing pain in any incisions.  You notice a discharge or a bad smell coming from any of your incisions.  You have a fever or  chills. Get help right away if:  You develop swelling, bloating, or pain in your abdomen.  You become dizzy or faint.  You develop a rash.  You have nausea or you vomit.  You faint, or you have shortness of breath or difficulty breathing.  You develop chest pain.  You have problems with your speech or vision.  You have trouble with your balance or moving your arms or legs. Summary  After the liver biopsy, it is common to have pain, soreness, and bruising in the area, as well as sleepiness and fatigue.  Take over-the-counter and prescription medicines only as told by your health care provider.  Follow instructions from your health care provider about how to care for your incision. Check the incision area daily for signs of infection. This information is not intended to replace advice given to you by your health care provider. Make sure you discuss any questions you have with your health care provider. Document Revised: 02/12/2018 Document Reviewed: 12/30/2016 Elsevier Patient Education  2020 [employer] Inc[employer]   Moderate Conscious Sedation, Adult, Care After These instructions provide you with information about caring for yourself after your procedure. Your health care provider may also give you more specific instructions. Your treatment has been planned according to current medical practices, but problems sometimes occur. Call your health care provider if you have any problems or questions after your procedure. What can I expect after the procedure? After your procedure, it is common:  To feel sleepy for several hours.  To feel clumsy and have poor balance for several hours.  To have poor judgment for several hours.  To vomit if you eat too soon. Follow these instructions at home: For at least 24 hours after the procedure:   Do not: ? Participate in activities where you could fall or become injured. ? Drive. ? Use heavy machinery. ? Drink alcohol. ? Take sleeping pills  or medicines that cause drowsiness. ? Make important decisions or sign legal documents. ? Take care of children on your own.  Rest. Eating and drinking  Follow the diet recommended by your health care provider.  If you vomit: ? Drink water, juice, or soup when you can drink without vomiting. ? Make sure you have little or no nausea before eating solid foods. General instructions  Have a responsible adult stay with you until you are awake and alert.  Take over-the-counter and prescription medicines only as told by your health care provider.  If you smoke, do not smoke without supervision.  Keep all follow-up visits as told by your health care provider. This is important. Contact a health care provider if:  You keep feeling nauseous or you keep vomiting.  You feel light-headed.  You develop a rash.  You have a fever. Get help right away if:  You have trouble breathing. This information is not intended to replace advice given to you by your health care   provider. Make sure you discuss any questions you have with your health care provider. Document Revised: 12/02/2016 Document Reviewed: 04/11/2015 Elsevier Patient Education  2020 [employer] Inc.  

## 2019-05-10 NOTE — Progress Notes (Signed)
Per the patient he is likely to have a passing out episode when being stuck by a needle. IV stick was performed to obtain labs and access for IV. Patient tolerated well and stated that he felt well. Approximately 5 minutes later the patient stated that he "did not feel well", cardiac monitor reflected a pause followed by bradycardia in the upper 30's, the patient remained concious. Gareth Eagle, PA was informed and was at the bedside immediately after. CBG was performed as the patient felt as though his blood glucose may be low, it was WNL. STAT EKG was also performed per the request of the PA. Dr. Kathlene Cote arrived at the bedside shortly after and requested that atropine be given due to sustained bradycardia. A normal saline bolus was also administered per the request of Dr. Kathlene Cote. Will continue to monitor patient for improvement.

## 2019-05-13 ENCOUNTER — Other Ambulatory Visit: Payer: BC Managed Care – PPO

## 2019-05-13 NOTE — Telephone Encounter (Signed)
Referral entered for [facility] for port placement.

## 2019-05-14 ENCOUNTER — Ambulatory Visit: Payer: BC Managed Care – PPO

## 2019-05-14 ENCOUNTER — Other Ambulatory Visit: Payer: Self-pay | Admitting: Oncology

## 2019-05-14 ENCOUNTER — Telehealth (INDEPENDENT_AMBULATORY_CARE_PROVIDER_SITE_OTHER): Payer: Self-pay

## 2019-05-14 ENCOUNTER — Telehealth: Payer: Self-pay | Admitting: *Deleted

## 2019-05-14 MED ORDER — OXYCODONE HCL 5 MG PO TABS: 5.0000 mg | ORAL_TABLET | Freq: Four times a day (QID) | ORAL | 0 refills | Status: DC | PRN

## 2019-05-14 NOTE — Telephone Encounter (Signed)
Spoke with the patient and he is now scheduled with Dr. Lucky Cowboy for a port placement on 05/20/19 with a 12:15 pm arrival time to the [facility]. Patient will do covid testing on 05/16/19 between 8-1 pm a the [facility]. Pre-procedure instructions were discussed and will be mailed to the patient.

## 2019-05-14 NOTE — Telephone Encounter (Signed)
Patient is scheduled with Dr. Lucky Cowboy for a port placement on 05/20/19 with a 12:15 pm arrival time.

## 2019-05-14 NOTE — Telephone Encounter (Signed)
Call returned to patient and advised of prescription sent

## 2019-05-14 NOTE — Telephone Encounter (Signed)
I will send him pain medication.

## 2019-05-14 NOTE — Telephone Encounter (Addendum)
Patient called reporting continued abdominal pain and is asking for pain medicine that was discussed giving him at his last appointment.

## 2019-05-16 ENCOUNTER — Other Ambulatory Visit: Payer: Self-pay

## 2019-05-16 ENCOUNTER — Other Ambulatory Visit
Admission: RE | Admit: 2019-05-16 | Discharge: 2019-05-16 | Disposition: A | Discharge status: Home / Self Care | Payer: BC Managed Care – PPO | Source: Ambulatory Visit | Attending: Vascular Surgery | Admitting: Vascular Surgery

## 2019-05-16 DIAGNOSIS — Z20822 Contact with and (suspected) exposure to covid-19: Secondary | ICD-10-CM | POA: Insufficient documentation

## 2019-05-16 DIAGNOSIS — Z01812 Encounter for preprocedural laboratory examination: Secondary | ICD-10-CM | POA: Insufficient documentation

## 2019-05-16 LAB — SURGICAL PATHOLOGY

## 2019-05-16 LAB — SARS CORONAVIRUS 2 (TAT 6-24 HRS): SARS Coronavirus 2: NEGATIVE

## 2019-05-17 ENCOUNTER — Other Ambulatory Visit (INDEPENDENT_AMBULATORY_CARE_PROVIDER_SITE_OTHER): Payer: Self-pay | Admitting: Nurse Practitioner

## 2019-05-17 ENCOUNTER — Encounter: Payer: Self-pay | Admitting: *Deleted

## 2019-05-17 ENCOUNTER — Other Ambulatory Visit: Payer: Self-pay

## 2019-05-17 ENCOUNTER — Inpatient Hospital Stay: Payer: BC Managed Care – PPO

## 2019-05-17 ENCOUNTER — Encounter: Payer: Self-pay | Admitting: Oncology

## 2019-05-17 ENCOUNTER — Inpatient Hospital Stay: Payer: BC Managed Care – PPO | Attending: Oncology | Admitting: Oncology

## 2019-05-17 VITALS — BP 108/73 | HR 87 | Temp 96.4°F | Resp 18 | Ht 71.26 in | Wt 178.6 lb

## 2019-05-17 DIAGNOSIS — K5903 Drug induced constipation: Secondary | ICD-10-CM | POA: Insufficient documentation

## 2019-05-17 DIAGNOSIS — C772 Secondary and unspecified malignant neoplasm of intra-abdominal lymph nodes: Secondary | ICD-10-CM | POA: Diagnosis not present

## 2019-05-17 DIAGNOSIS — F17210 Nicotine dependence, cigarettes, uncomplicated: Secondary | ICD-10-CM | POA: Insufficient documentation

## 2019-05-17 DIAGNOSIS — C259 Malignant neoplasm of pancreas, unspecified: Secondary | ICD-10-CM | POA: Diagnosis not present

## 2019-05-17 DIAGNOSIS — I10 Essential (primary) hypertension: Secondary | ICD-10-CM | POA: Diagnosis not present

## 2019-05-17 DIAGNOSIS — G893 Neoplasm related pain (acute) (chronic): Secondary | ICD-10-CM | POA: Insufficient documentation

## 2019-05-17 DIAGNOSIS — N189 Chronic kidney disease, unspecified: Secondary | ICD-10-CM | POA: Diagnosis not present

## 2019-05-17 DIAGNOSIS — Z5189 Encounter for other specified aftercare: Secondary | ICD-10-CM | POA: Insufficient documentation

## 2019-05-17 DIAGNOSIS — E876 Hypokalemia: Secondary | ICD-10-CM | POA: Insufficient documentation

## 2019-05-17 DIAGNOSIS — T402X5A Adverse effect of other opioids, initial encounter: Secondary | ICD-10-CM | POA: Insufficient documentation

## 2019-05-17 DIAGNOSIS — Z5111 Encounter for antineoplastic chemotherapy: Secondary | ICD-10-CM | POA: Diagnosis not present

## 2019-05-17 DIAGNOSIS — Z85528 Personal history of other malignant neoplasm of kidney: Secondary | ICD-10-CM | POA: Diagnosis not present

## 2019-05-17 DIAGNOSIS — Z79899 Other long term (current) drug therapy: Secondary | ICD-10-CM | POA: Insufficient documentation

## 2019-05-17 DIAGNOSIS — K8689 Other specified diseases of pancreas: Secondary | ICD-10-CM

## 2019-05-17 DIAGNOSIS — E871 Hypo-osmolality and hyponatremia: Secondary | ICD-10-CM | POA: Diagnosis not present

## 2019-05-17 DIAGNOSIS — Z7189 Other specified counseling: Secondary | ICD-10-CM | POA: Diagnosis not present

## 2019-05-17 DIAGNOSIS — F419 Anxiety disorder, unspecified: Secondary | ICD-10-CM | POA: Insufficient documentation

## 2019-05-17 DIAGNOSIS — Z7952 Long term (current) use of systemic steroids: Secondary | ICD-10-CM | POA: Diagnosis not present

## 2019-05-17 DIAGNOSIS — C787 Secondary malignant neoplasm of liver and intrahepatic bile duct: Secondary | ICD-10-CM | POA: Diagnosis not present

## 2019-05-17 DIAGNOSIS — K831 Obstruction of bile duct: Secondary | ICD-10-CM

## 2019-05-17 DIAGNOSIS — Z905 Acquired absence of kidney: Secondary | ICD-10-CM | POA: Insufficient documentation

## 2019-05-17 DIAGNOSIS — Z8249 Family history of ischemic heart disease and other diseases of the circulatory system: Secondary | ICD-10-CM | POA: Insufficient documentation

## 2019-05-17 DIAGNOSIS — Z833 Family history of diabetes mellitus: Secondary | ICD-10-CM | POA: Diagnosis not present

## 2019-05-17 LAB — COMPREHENSIVE METABOLIC PANEL
ALT: 53 U/L — ABNORMAL HIGH (ref 0–44)
AST: 27 U/L (ref 15–41)
Albumin: 3.7 g/dL (ref 3.5–5.0)
Alkaline Phosphatase: 139 U/L — ABNORMAL HIGH (ref 38–126)
Anion gap: 10 (ref 5–15)
BUN: 14 mg/dL (ref 8–23)
CO2: 24 mmol/L (ref 22–32)
Calcium: 9.5 mg/dL (ref 8.9–10.3)
Chloride: 99 mmol/L (ref 98–111)
Creatinine, Ser: 1.08 mg/dL (ref 0.61–1.24)
GFR calc Af Amer: >60 mL/min (ref >60)
GFR calc non Af Amer: >60 mL/min (ref >60)
Glucose, Bld: 86 mg/dL (ref 70–99)
Potassium: 4.3 mmol/L (ref 3.5–5.1)
Sodium: 133 mmol/L — ABNORMAL LOW (ref 135–145)
Total Bilirubin: 2.1 mg/dL — ABNORMAL HIGH (ref 0.3–1.2)
Total Protein: 7.6 g/dL (ref 6.5–8.1)

## 2019-05-17 MED ORDER — DEXAMETHASONE 4 MG PO TABS: 8.0000 mg | ORAL_TABLET | Freq: Every day | ORAL | 5 refills | Status: DC

## 2019-05-17 MED ORDER — LIDOCAINE-PRILOCAINE 2.5-2.5 % EX CREA: TOPICAL_CREAM | CUTANEOUS | 3 refills | Status: AC

## 2019-05-17 MED ORDER — PROCHLORPERAZINE MALEATE 10 MG PO TABS: 10.0000 mg | ORAL_TABLET | Freq: Four times a day (QID) | ORAL | 1 refills | Status: DC | PRN

## 2019-05-17 MED ORDER — LOPERAMIDE HCL 2 MG PO TABS: ORAL_TABLET | ORAL | 1 refills | Status: AC

## 2019-05-17 NOTE — Progress Notes (Signed)
Hematology/Oncology Follow Up Note [facility]  [phone] [phone]  Patient Care Team: Olin Hauser, DO as PCP - General (Family Medicine) Clent Jacks, RN as Oncology Nurse Navigator   Name of the patient: Kenneth Maxwell  [id number]  19-Aug-1955   REASON FOR VISIT  follow-up for pancreatic cancer  INTERVAL HISTORY 64 y.o. male presents for management of pancreatic cancer. Patient has a remote history of RCC status post right nephrectomy in 11/23/2018. Anxiety, alcohol abuse, tobacco abuse. Patient was admitted from 04/26/2019-04/29/2019 due to painless jaundice, generalized weakness. Bilirubin was elevated to 10, right upper quadrant ultrasound showed 3.6 cm hypoechoic mass along the posterior margin of the left liver lobe.  Gallbladder sludge.  Dilated CBD. MRCP showed hypoenhancing mass of the pancreatic head measuring about 3.6 cm, obstructing CBD near the ampulla consistent with pancreatic adenocarcinoma.  Hypoenhancing metastasis of liver and cholelithiasis. Patient was seen by gastroenterology.  Had a ERCP on 04/28/2019 which revealed a duodenal mass, single localized biliary stricture in the lower third of the main bile duct and dilated entire main bile duct.  A biliary sphincterotomy was performed.  Biliary stent was placed into the common bile duct.  Biopsy was performed. Unfortunately biopsy was not diagnostic.  Patient's bilirubin trended down after procedure.  05/10/2019 he underwent ultrasound-guided left liver mass biopsy and the pathology showed metastatic adenocarcinoma, consistent with pancreati biliary origin.  Today patient was accompanied by his wife.  Patient has called earlier this week reporting abdominal pain which was not improved by taking Tylenol or NSAIDs.  Patient was prescribed with oxycodone 5 mg every 6 hours as needed.  He reports pain is better controlled. He is constipated.  Last bowel movement 3  days ago.  Review of Systems  Constitutional: Negative for appetite change, chills, fatigue, fever and unexpected weight change.  HENT:   Negative for hearing loss and voice change.   Eyes: Negative for eye problems and icterus.  Respiratory: Negative for chest tightness, cough and shortness of breath.   Cardiovascular: Negative for chest pain and leg swelling.  Gastrointestinal: Positive for abdominal pain and constipation. Negative for abdominal distention.  Endocrine: Negative for hot flashes.  Genitourinary: Negative for difficulty urinating, dysuria and frequency.   Musculoskeletal: Negative for arthralgias.  Skin: Negative for itching and rash.  Neurological: Negative for light-headedness and numbness.  Hematological: Negative for adenopathy. Does not bruise/bleed easily.  Psychiatric/Behavioral: Negative for confusion.      Allergies  Allergen Reactions  . Codeine Rash and Other (See Comments)     Past Medical History:  Diagnosis Date  . Alcohol abuse   . Arthritis   . Blood clotting disorder ([place])   . Chronic renal disease   . H/O urinary retention   . Hematuria, gross   . Hypertension   . Renal cell carcinoma ([place]) 11/2013   kidney cancer s/p R nephrectomy  . Right renal mass      Past Surgical History:  Procedure Laterality Date  . ERCP N/A 04/28/2019   Procedure: ENDOSCOPIC RETROGRADE CHOLANGIOPANCREATOGRAPHY (ERCP);  Surgeon: Lucilla Lame, MD;  Location: [facility] ENDOSCOPY;  Service: Endoscopy;  Laterality: N/A;  . NEPHRECTOMY RADICAL Right 11/2013   R side kidney cancer surgery, non adrenal sparing    Social History   Socioeconomic History  . Marital status: Married    Spouse name: Not on file  . Number of children: Not on file  . Years of education: Not on file  . Highest  education level: Not on file  Occupational History  . Not on file  Tobacco Use  . Smoking status: Current Every Day Smoker    Packs/day: 1.00    Years: 50.00    Pack years:  50.00    Types: Cigarettes  . Smokeless tobacco: Current User  Substance and Sexual Activity  . Alcohol use: Not Currently    Alcohol/week: 49.0 standard drinks    Types: 49 Cans of beer per week    Comment: hasn't drank in over 1 month  . Drug use: No  . Sexual activity: Yes  Other Topics Concern  . Not on file  Social History Narrative  . Not on file   Social Determinants of Health   Financial Resource Strain:   . Difficulty of Paying Living Expenses:   Food Insecurity:   . Worried About Charity fundraiser in the Last Year:   . Arboriculturist in the Last Year:   Transportation Needs:   . Film/video editor (Medical):   Marland Kitchen Lack of Transportation (Non-Medical):   Physical Activity:   . Days of Exercise per Week:   . Minutes of Exercise per Session:   Stress:   . Feeling of Stress :   Social Connections:   . Frequency of Communication with Friends and Family:   . Frequency of Social Gatherings with Friends and Family:   . Attends Religious Services:   . Active Member of Clubs or Organizations:   . Attends Archivist Meetings:   Marland Kitchen Marital Status:   Intimate Partner Violence:   . Fear of Current or Ex-Partner:   . Emotionally Abused:   Marland Kitchen Physically Abused:   . Sexually Abused:     Family History  Problem Relation Age of Onset  . Hypertension Mother   . Diabetes Mother   . Hypertension Father   . Heart attack Father      Current Outpatient Medications:  .  amLODipine (NORVASC) 10 MG tablet, Take 1 tablet (10 mg total) by mouth daily., Disp: 90 tablet, Rfl: 3 .  losartan (COZAAR) 100 MG tablet, Take 1 tablet (100 mg total) by mouth daily., Disp: 90 tablet, Rfl: 3 .  Multiple Vitamin (MULTIVITAMIN WITH MINERALS) TABS tablet, Take 1 tablet by mouth daily., Disp:  , Rfl:  .  oxyCODONE (ROXICODONE) 5 MG immediate release tablet, Take 1 tablet (5 mg total) by mouth every 6 (six) hours as needed for severe pain., Disp: 30 tablet, Rfl: 0 .  spironolactone  (ALDACTONE) 25 MG tablet, Take 1 tablet (25 mg total) by mouth daily., Disp: 30 tablet, Rfl: 1  Physical exam:  Vitals:   05/17/19 1024  BP: 108/73  Pulse: 87  Resp: 18  Temp: (!) 96.4 F (35.8 C)  Weight: 178 lb 9.6 oz (81 kg)  Height: 5' 11.26" (1.81 m)   Physical Exam Constitutional:      General: He is not in acute distress. HENT:     Head: Normocephalic and atraumatic.  Eyes:     General: No scleral icterus. Cardiovascular:     Rate and Rhythm: Normal rate and regular rhythm.     Heart sounds: Normal heart sounds.  Pulmonary:     Effort: Pulmonary effort is normal. No respiratory distress.     Breath sounds: No wheezing.  Abdominal:     General: Bowel sounds are normal. There is no distension.     Palpations: Abdomen is soft.  Musculoskeletal:  General: No deformity. Normal range of motion.     Cervical back: Normal range of motion and neck supple.  Skin:    General: Skin is warm and dry.     Coloration: Skin is jaundiced.     Findings: No erythema or rash.  Neurological:     Mental Status: He is alert and oriented to person, place, and time. Mental status is at baseline.     Cranial Nerves: No cranial nerve deficit.     Coordination: Coordination normal.  Psychiatric:        Mood and Affect: Mood normal.     CMP Latest Ref Rng & Units 05/17/2019  Glucose 70 - 99 mg/dL 86  BUN 8 - 23 mg/dL 14  Creatinine 0.61 - 1.24 mg/dL 1.08  Sodium 135 - 145 mmol/L 133(L)  Potassium 3.5 - 5.1 mmol/L 4.3  Chloride 98 - 111 mmol/L 99  CO2 22 - 32 mmol/L 24  Calcium 8.9 - 10.3 mg/dL 9.5  Total Protein 6.5 - 8.1 g/dL 7.6  Total Bilirubin 0.3 - 1.2 mg/dL 2.1(H)  Alkaline Phos 38 - 126 U/L 139(H)  AST 15 - 41 U/L 27  ALT 0 - 44 U/L 53(H)   CBC Latest Ref Rng & Units 05/10/2019  WBC 4.0 - 10.5 K/uL 14.5(H)  Hemoglobin 13.0 - 17.0 g/dL 15.2  Hematocrit 39.0 - 52.0 % 43.1  Platelets 150 - 400 K/uL 399    RADIOGRAPHIC STUDIES: I have personally reviewed the  radiological images as listed and agreed with the findings in the report. DG Eye Foreign Body  Result Date: 04/26/2019 CLINICAL DATA:  Screening orbits for MRI EXAM: ORBITS FOR FOREIGN BODY - 2 VIEW COMPARISON:  10/31/2013 FINDINGS: Two frontal views of the orbits demonstrate no radiopaque foreign bodies. No bony abnormalities. Sinuses are clear. IMPRESSION: 1. No radiopaque foreign bodies. Electronically Signed   By: Randa Ngo M.D.   On: 04/26/2019 15:18   CT CHEST W CONTRAST  Result Date: 04/29/2019 CLINICAL DATA:  Cancer of unknown primary. EXAM: CT CHEST WITH CONTRAST TECHNIQUE: Multidetector CT imaging of the chest was performed during intravenous contrast administration. CONTRAST:  88mL OMNIPAQUE IOHEXOL 300 MG/ML  SOLN COMPARISON:  April 26, 2019. November 11, 2013. FINDINGS: Cardiovascular: Atherosclerosis of thoracic aorta is noted without aneurysm or dissection. Normal cardiac size. No pericardial effusion. Mild coronary calcifications are noted. Mediastinum/Nodes: No enlarged mediastinal, hilar, or axillary lymph nodes. Thyroid gland, trachea, and esophagus demonstrate no significant findings. Lungs/Pleura: No pneumothorax or pleural effusion is noted. Mild emphysematous disease is noted in the upper lobes. 5 mm nodular density is seen in the right lower lobe just above the diaphragm best seen on image number 111 of series 3. Upper Abdomen: Liver mass and pancreatic mass are again noted as described on recent MRI. Musculoskeletal: No chest wall abnormality. No acute or significant osseous findings. IMPRESSION: 1. 5 mm nodular density is noted in right lower lobe just above the diaphragm. Given the recent diagnosis of pancreatic and hepatic masses, metastatic disease cannot be excluded and follow-up chest CT in 3 months is recommended. 2. Mild coronary calcifications are noted. 3. Liver mass and pancreatic mass are again noted as described on recent MRI. Aortic Atherosclerosis (ICD10-I70.0) and  Emphysema (ICD10-J43.9). Electronically Signed   By: Marijo Conception M.D.   On: 04/29/2019 13:31   NM PET Image Initial (PI) Skull Base To Thigh  Result Date: 05/07/2019 CLINICAL DATA:  Initial treatment strategy for pancreatic cancer. History of renal cell  carcinoma in 2016 with nephrectomy. EXAM: NUCLEAR MEDICINE PET SKULL BASE TO THIGH TECHNIQUE: 9.4 mCi F-18 FDG was injected intravenously. Full-ring PET imaging was performed from the skull base to thigh after the radiotracer. CT data was obtained and used for attenuation correction and anatomic localization. Fasting blood glucose: 103 mg/dl COMPARISON:  04/29/2019 chest CT.  04/26/2019 abdominal MRI. FINDINGS: Mediastinal blood pool activity: SUV max 2.4 Liver activity: SUV max NA NECK: No areas of abnormal hypermetabolism. Incidental CT findings: Bilateral carotid atherosclerosis. No cervical adenopathy. CHEST: No pulmonary parenchymal or thoracic nodal hypermetabolism. Incidental CT findings: Deferred to recent diagnostic CT. Aortic and coronary artery atherosclerosis. Centrilobular and paraseptal emphysema. ABDOMEN/PELVIS: Segment 2 lesion measures 2.9 cm and a S.U.V. max of 9.4 on 151/2. Segment 5 lesion measures 2.0 cm and a S.U.V. max of 4.8 on 179/3. Pancreatic head mass measures on the order of 3.5 cm and a S.U.V. max of 4.8 on 178/3. A node adjacent the SMA measures 8 mm and a S.U.V. max of 4.0 on 181/3. Gastric cardia underdistention and hypermetabolism, including at a S.U.V. max of 7.3 on 136/3. Incidental CT findings: Left adrenal thickening is low-density, suggesting hyperplasia are adenoma. Left renal vascular calcification. Right nephrectomy without local recurrence. Common duct stent in place. Small gallstone. Abdominal aortic atherosclerosis. SKELETON: No abnormal marrow activity. Incidental CT findings: Degenerative changes of both hips. IMPRESSION: 1. Hypermetabolic pancreatic primary with bilateral hepatic and peripancreatic nodal  metastasis. 2. No evidence of hypermetabolic extraabdominal metastatic disease. 3. Right nephrectomy, without locally recurrent disease. 4. Proximal gastric hypermetabolism could be physiologic or represent gastritis. 5. Incidental findings, including: Aortic atherosclerosis (ICD10-I70.0), coronary artery atherosclerosis and emphysema (ICD10-J43.9). Cholelithiasis. Electronically Signed   By: Abigail Miyamoto M.D.   On: 05/07/2019 13:33   US BIOPSY (LIVER)  Result Date: 05/10/2019 INDICATION: Mass of the head of the pancreas with 2 lesions in the liver suspicious for metastatic disease. EXAM: ULTRASOUND GUIDED CORE BIOPSY OF LIVER MEDICATIONS: None. ANESTHESIA/SEDATION: Fentanyl 50 mcg IV; Versed 1.0 mg IV Moderate Sedation Time:  16 minutes. The patient was continuously monitored during the procedure by the interventional radiology nurse under my direct supervision. PROCEDURE: The procedure, risks, benefits, and alternatives were explained to the patient. Questions regarding the procedure were encouraged and answered. The patient understands and consents to the procedure. A time-out was performed prior to initiating the procedure. Ultrasound was utilized to localize a lesion within the left lobe of the liver. The anterior abdominal wall was prepped with chlorhexidine in a sterile fashion, and a sterile drape was applied covering the operative field. A sterile gown and sterile gloves were used for the procedure. Local anesthesia was provided with 1% Lidocaine. Under ultrasound guidance, a 38 gauge trocar needle was advanced to the level of a lesion within the posterior aspect of the left lobe of the liver. After confirming needle tip position, 3 separate coaxial 18 gauge core biopsy samples were obtained. Samples were submitted in formalin. A slurry of Gel-Foam pledgets mixed in sterile saline was then injected through the outer needle as the needle was retracted and removed. Additional ultrasound was performed.  COMPLICATIONS: None immediate. FINDINGS: Well-circumscribed hypoechoic mass within the posterior aspect of the left lobe of the liver measures approximately 3.7 cm in greatest diameter. Solid tissue was obtained. IMPRESSION: Ultrasound-guided core biopsy performed of a lesion within the left lobe of the liver measuring approximately 3.7 cm in greatest diameter. Electronically Signed   By: Aletta Edouard M.D.   On: 05/10/2019 12:39  DG C-Arm 1-60 Min-No Report  Result Date: 04/28/2019 Fluoroscopy was utilized by the requesting physician.  No radiographic interpretation.   MR ABDOMEN MRCP W WO CONTAST  Result Date: 04/26/2019 CLINICAL DATA:  Liver lesion, cholelithiasis, history of renal cell carcinoma and right nephrectomy EXAM: MRI ABDOMEN WITHOUT AND WITH CONTRAST (INCLUDING MRCP) TECHNIQUE: Multiplanar multisequence MR imaging of the abdomen was performed both before and after the administration of intravenous contrast. Heavily T2-weighted images of the biliary and pancreatic ducts were obtained, and three-dimensional MRCP images were rendered by post processing. CONTRAST:  7.38mL GADAVIST GADOBUTROL 1 MMOL/ML IV SOLN COMPARISON:  Abdominal ultrasound, 04/26/2019, CT abdomen pelvis, 06/30/2014, MR abdomen pelvis, 10/31/2013 FINDINGS: Lower chest: No acute findings. Hepatobiliary: There are T2 hyperintense, intrinsically T1 hypointense lesions of the subcapsular anterior right lobe of the liver adjacent to the gallbladder fossa, hepatic segment V, measuring 2.4 x 2.0 cm and of the posterior left lobe of the liver, segment III, measuring 3.7 x 2.4 cm. These lesions demonstrate central hypoenhancement with progressive rim enhancement on multiphasic contrast enhanced imaging. There are additional small, fluid signal nonenhancing cysts of the liver. Extrahepatic biliary ductal dilatation, with enlargement of the common bile duct up to 14 mm. Pancreas: There is a T2 hyperintense, slightly T1 hypointense,  hypoenhancing mass of the pancreatic head measuring approximately 3.6 x 3.1 cm (series 21, image 54). This lesion is situated very laterally and near the ampulla, and does not closely abut the portal vein, superior mesenteric vein, celiac axis, superior mesenteric artery. Spleen:  Within normal limits in size and appearance. Adrenals/Urinary Tract: Status post right nephrectomy. No masses identified. No evidence of hydronephrosis. Stomach/Bowel: Visualized portions within the abdomen are unremarkable. Vascular/Lymphatic: No pathologically enlarged lymph nodes identified. No abdominal aortic aneurysm demonstrated. Other:  None. Musculoskeletal: No suspicious bone lesions identified. IMPRESSION: 1. Hypoenhancing mass of the pancreatic head measuring approximately 3.6 cm, obstructing the common bile duct near the ampulla, consistent with pancreatic adenocarcinoma. The pancreatic duct is nondilated. 2. 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 3.  No significant lymphadenopathy. 4. Status post right nephrectomy. No evidence of malignant recurrence at the nephrectomy site. 5.  Cholelithiasis. Electronically Signed   By: Eddie Candle M.D.   On: 04/26/2019 16:56   US Abdomen Limited RUQ  Result Date: 04/26/2019 CLINICAL DATA:  Liver disease. History of a right nephrectomy for renal cell carcinoma. EXAM: ULTRASOUND ABDOMEN LIMITED RIGHT UPPER QUADRANT COMPARISON:  CT, 06/30/2014.  MRI, 10/31/2013. FINDINGS: Gallbladder: Distended. Some dependent sludge. 1.2 cm apparent nonshadowing gallstone. Several echogenic foci along the wall consistent with small polyps. The apparent stone could potentially reflect a polyp. No wall thickening. No pericholecystic fluid. Common bile duct: Diameter: 13 mm.  No visualized duct stone or sludge. Liver: Increased liver parenchymal echogenicity. Subtle nodular contour. There is a hypoechoic mass with a nearly anechoic partial rim, along the posterior left lobe,  measuring 3.6 x 2.4 x 3.4 cm, not visualized on the prior MRI or CT. No other liver masses or lesions. Portal vein is patent on color Doppler imaging with normal direction of blood flow towards the liver. Other: None. IMPRESSION: 1. 3.6 cm hypoechoic mass along the posterior margin of the left liver lobe, not visualized on the prior studies. Recommend follow-up liver MRI without and with contrast for further assessment. 2. Gallbladder sludge, probable small polyps and a 1.2 cm nonshadowing stone versus a larger polyp. No evidence of acute cholecystitis. 3. Dilated common bile duct to 1.3  cm, increased in size compared to the prior studies. No visualized duct stone. Recommend follow-up MRCP to accompany the liver MRI, for further assessment. Electronically Signed   By: Lajean Manes M.D.   On: 04/26/2019 12:19     Assessment and plan Patient is a 64 y.o. 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 1. Goals of care, counseling/discussion   2. Pancreatic adenocarcinoma ([place])   3. Obstructive jaundice    The diagnosis of stage IV pancreatic cancer and care plan were discussed with patient in detail.  NCCN guidelines were reviewed and shared with patient.   The goal of treatment which is to palliate disease, disease related symptoms, improve quality of life and hopefully prolong life was [id number] in our discussion.  Chemotherapy education was provided.  We had discussed the composition of chemotherapy regimen, length of chemo cycle, duration of treatment and the time to assess response to treatment.    I explained to the patient the risks and benefits of chemotherapy modified FOLFIRINOX including all but not limited to infusion reaction, hair loss, mouth sore, nausea, vomiting, diarrhea, low blood counts, bleeding, neuropathy, transaminitis and risk of life  threatening infection and even death, secondary malignancy etc.  . Patient voices understanding and willing to proceed chemotherapy.   # Chemotherapy education; Medi- port placement. Antiemetics-Zofran and Compazine; EMLA cream sent to pharmacy #Obstructive jaundice, status post biliary stent placement.  Bilirubin has trended down. Refer to palliative care service.  Supportive care measures are necessary for patient well-being and will be provided as necessary. We spent sufficient time to discuss many aspect of care, questions were answered to patient's satisfaction.   Orders Placed This Encounter  Procedures  . CBC with Differential    Standing Status:   Standing    Number of Occurrences:   20    Standing Expiration Date:   05/17/2020  . Comprehensive metabolic panel    Standing Status:   Standing    Number of Occurrences:   20    Standing Expiration Date:   05/17/2020  . Cancer antigen 19-9    Standing Status:   Standing    Number of Occurrences:   20    Standing Expiration Date:   05/16/2020  . Ambulatory Referral to Palliative Care    Referral Priority:   Routine    Referral Type:   Consultation    Referral Reason:   Advance Care Planning    Referred to Provider:   Irean Hong, NP    Number of Visits Requested:   1      Follow up in 1 week.   Earlie Server, MD, PhD Hematology Oncology [facility] at [facility] Pager- [phone] 05/17/2019

## 2019-05-17 NOTE — Progress Notes (Signed)
START ON PATHWAY REGIMEN - Pancreatic Adenocarcinoma     A cycle is every 14 days:     Oxaliplatin      Leucovorin      Irinotecan      Fluorouracil   **Always confirm dose/schedule in your pharmacy ordering system**  Patient Characteristics: Metastatic Disease, First Line, PS = 0,1, BRCA1/2 and PALB2  Mutation Absent/Unknown Therapeutic Status: Metastatic Disease Line of Therapy: First Line ECOG Performance Status: 0 BRCA1/2 Mutation Status: Awaiting Test Results PALB2 Mutation Status: Awaiting Test Results Intent of Therapy: Non-Curative / Palliative Intent, Discussed with Patient

## 2019-05-17 NOTE — Research (Signed)
Met with patient Kenneth Maxwell and wife at Dr. Collie Siad request to discuss participation in the [employer] study. Reviewed the purpose of the study as well as the required blood draw of 5 tubes, and informed patient that the specimen has to be collected prior to beginning his treatment. Informed of information that will be submitted along with the labs and that the informed consent form must be signed prior to any treatment procedures, and that he will be given a $50 gift card one the study specimen has been collected. Patient states he wants to think about it first, and he was given a copy of the informed consent form to take home and review along with my contact information in case he has any questions. Plans made to contact patient for his decision next week, but will wait until he has his port-a-cath placed on Monday. Kenneth Maxwell, BSN, MHA, OCN 05/17/2019 11:16 AM

## 2019-05-17 NOTE — Progress Notes (Signed)
Patient was evaluated inpatient.  He is here today do discuss biopsy results.  Patient's low abdominal pain has improved with Oxycodone but he is now having constipation.  Reports no energy and is wondering if it could be related to his bp lower than his usual, does take 3 bp meds.

## 2019-05-20 ENCOUNTER — Telehealth: Payer: Self-pay

## 2019-05-20 ENCOUNTER — Ambulatory Visit
Admission: RE | Admit: 2019-05-20 | Discharge: 2019-05-20 | Disposition: A | Discharge status: Home / Self Care | Payer: BC Managed Care – PPO | Attending: Vascular Surgery | Admitting: Vascular Surgery

## 2019-05-20 ENCOUNTER — Encounter
Admission: RE | Disposition: A | Discharge status: Home / Self Care | Payer: Self-pay | Source: Home / Self Care | Attending: Vascular Surgery

## 2019-05-20 ENCOUNTER — Encounter: Payer: Self-pay | Admitting: Vascular Surgery

## 2019-05-20 ENCOUNTER — Other Ambulatory Visit: Payer: Self-pay

## 2019-05-20 DIAGNOSIS — F17210 Nicotine dependence, cigarettes, uncomplicated: Secondary | ICD-10-CM | POA: Diagnosis not present

## 2019-05-20 DIAGNOSIS — N189 Chronic kidney disease, unspecified: Secondary | ICD-10-CM | POA: Insufficient documentation

## 2019-05-20 DIAGNOSIS — C259 Malignant neoplasm of pancreas, unspecified: Secondary | ICD-10-CM | POA: Diagnosis not present

## 2019-05-20 DIAGNOSIS — C787 Secondary malignant neoplasm of liver and intrahepatic bile duct: Secondary | ICD-10-CM | POA: Diagnosis not present

## 2019-05-20 DIAGNOSIS — I129 Hypertensive chronic kidney disease with stage 1 through stage 4 chronic kidney disease, or unspecified chronic kidney disease: Secondary | ICD-10-CM | POA: Diagnosis not present

## 2019-05-20 DIAGNOSIS — Z885 Allergy status to narcotic agent status: Secondary | ICD-10-CM | POA: Diagnosis not present

## 2019-05-20 DIAGNOSIS — K8020 Calculus of gallbladder without cholecystitis without obstruction: Secondary | ICD-10-CM | POA: Insufficient documentation

## 2019-05-20 DIAGNOSIS — Z85528 Personal history of other malignant neoplasm of kidney: Secondary | ICD-10-CM | POA: Insufficient documentation

## 2019-05-20 DIAGNOSIS — Z8249 Family history of ischemic heart disease and other diseases of the circulatory system: Secondary | ICD-10-CM | POA: Diagnosis not present

## 2019-05-20 HISTORY — PX: PORTA CATH INSERTION: CATH118285

## 2019-05-20 SURGERY — PORTA CATH INSERTION
Anesthesia: Moderate Sedation

## 2019-05-20 MED ORDER — SODIUM CHLORIDE 0.9 % IV SOLN
INTRAVENOUS | Status: DC
  Administered 2019-05-20: 1000 mL via INTRAVENOUS

## 2019-05-20 MED ORDER — SODIUM CHLORIDE 0.9 % IV SOLN
Freq: Once | INTRAVENOUS | Status: DC
  Filled 2019-05-20 (×2): qty 2

## 2019-05-20 MED ORDER — ONDANSETRON HCL 4 MG/2ML IJ SOLN: 4.0000 mg | Freq: Four times a day (QID) | INTRAMUSCULAR | Status: DC | PRN

## 2019-05-20 MED ORDER — DIPHENHYDRAMINE HCL 50 MG/ML IJ SOLN: 50.0000 mg | Freq: Once | INTRAMUSCULAR | Status: DC | PRN

## 2019-05-20 MED ORDER — HEPARIN SOD (PORK) LOCK FLUSH 100 UNIT/ML IV SOLN
INTRAVENOUS | Status: AC
  Filled 2019-05-20: qty 5

## 2019-05-20 MED ORDER — METHYLPREDNISOLONE SODIUM SUCC 125 MG IJ SOLR: 125.0000 mg | Freq: Once | INTRAMUSCULAR | Status: DC | PRN

## 2019-05-20 MED ORDER — FENTANYL CITRATE (PF) 100 MCG/2ML IJ SOLN
INTRAMUSCULAR | Status: AC
  Filled 2019-05-20: qty 2

## 2019-05-20 MED ORDER — MIDAZOLAM HCL 5 MG/5ML IJ SOLN
INTRAMUSCULAR | Status: AC
  Filled 2019-05-20: qty 5

## 2019-05-20 MED ORDER — CEFAZOLIN SODIUM-DEXTROSE 2-4 GM/100ML-% IV SOLN
INTRAVENOUS | Status: AC
  Administered 2019-05-20: 2 g via INTRAVENOUS
  Filled 2019-05-20: qty 100

## 2019-05-20 MED ORDER — MIDAZOLAM HCL 2 MG/2ML IJ SOLN
INTRAMUSCULAR | Status: DC | PRN
  Administered 2019-05-20: 2 mg via INTRAVENOUS
  Administered 2019-05-20: 1 mg via INTRAVENOUS

## 2019-05-20 MED ORDER — CEFAZOLIN SODIUM-DEXTROSE 2-4 GM/100ML-% IV SOLN: 2.0000 g | Freq: Once | INTRAVENOUS | Status: AC

## 2019-05-20 MED ORDER — FENTANYL CITRATE (PF) 100 MCG/2ML IJ SOLN
INTRAMUSCULAR | Status: DC | PRN
  Administered 2019-05-20: 25 ug via INTRAVENOUS
  Administered 2019-05-20: 50 ug via INTRAVENOUS

## 2019-05-20 MED ORDER — FAMOTIDINE 20 MG PO TABS: 40.0000 mg | ORAL_TABLET | Freq: Once | ORAL | Status: DC | PRN

## 2019-05-20 MED ORDER — FENTANYL CITRATE (PF) 100 MCG/2ML IJ SOLN: 12.5000 ug | Freq: Once | INTRAMUSCULAR | Status: DC | PRN

## 2019-05-20 MED ORDER — MIDAZOLAM HCL 2 MG/ML PO SYRP: 8.0000 mg | ORAL_SOLUTION | Freq: Once | ORAL | Status: DC | PRN

## 2019-05-20 SURGICAL SUPPLY — 13 items
DERMABOND ADVANCED (GAUZE/BANDAGES/DRESSINGS) ×2
DERMABOND ADVANCED .7 DNX12 (GAUZE/BANDAGES/DRESSINGS) ×1 IMPLANT
ELECT REM PT RETURN 9FT ADLT (ELECTROSURGICAL) ×3
ELECTRODE REM PT RTRN 9FT ADLT (ELECTROSURGICAL) ×1 IMPLANT
KIT PORT POWER 8FR ISP CVUE (Port) ×3 IMPLANT
PACK ANGIOGRAPHY (CUSTOM PROCEDURE TRAY) ×3 IMPLANT
PENCIL ELECTRO HAND CTR (MISCELLANEOUS) ×3 IMPLANT
SUT MNCRL 4-0 (SUTURE) ×2
SUT MNCRL 4-0 27XMFL (SUTURE) ×1
SUT PROLENE 0 CT 1 30 (SUTURE) ×3 IMPLANT
SUT VIC AB 3-0 CT1 27 (SUTURE) ×2
SUT VIC AB 3-0 CT1 TAPERPNT 27 (SUTURE) ×1 IMPLANT
SUTURE MNCRL 4-0 27XMF (SUTURE) ×1 IMPLANT

## 2019-05-20 NOTE — Telephone Encounter (Signed)
-----   Message from Earlie Server, MD sent at 05/17/2019 10:38 PM EDT ----- Please send his 05/10/2019 liver biopsy specimen for NGS. Thanks.

## 2019-05-20 NOTE — Op Note (Signed)
      [facility]       Operative Note  Date: 05/20/2019  Preoperative diagnosis:  1. Pancreatic cancer  Postoperative diagnosis:  Same as above  Procedures: #1. Ultrasound guidance for vascular access to the right internal jugular vein. #2. Fluoroscopic guidance for placement of catheter. #3. Placement of CT compatible Port-A-Cath, right internal jugular vein.  Surgeon: Leotis Pain, MD.   Anesthesia: Local with moderate conscious sedation for approximately 20  minutes using 3 mg of Versed and 75 mcg of Fentanyl  Fluoroscopy time: less than 1 minute  Contrast used: 0  Estimated blood loss: 3 cc  Indication for the procedure:  The patient is a 64 y.o.male with stage 4 pancreatic cancer.  The patient needs a Port-A-Cath for durable venous access, chemotherapy, lab draws, and CT scans. We are asked to place this. Risks and benefits were discussed and informed consent was obtained.  Description of procedure: The patient was brought to the vascular and interventional radiology suite.  Moderate conscious sedation was administered throughout the procedure during a face to face encounter with the patient with my supervision of the RN administering medicines and monitoring the patient's vital signs, pulse oximetry, telemetry and mental status throughout from the start of the procedure until the patient was taken to the recovery room. The right neck chest and shoulder were sterilely prepped and draped, and a sterile surgical field was created. Ultrasound was used to help visualize a patent right internal jugular vein. This was then accessed under direct ultrasound guidance without difficulty with the Seldinger needle and a permanent image was recorded. A J-wire was placed. After skin nick and dilatation, the peel-away sheath was then placed over the wire. I then anesthetized an area under the clavicle approximately 1-2 fingerbreadths. A transverse incision was created and an  inferior pocket was created with electrocautery and blunt dissection. The port was then brought onto the field, placed into the pocket and secured to the chest wall with 2 Prolene sutures. The catheter was connected to the port and tunneled from the subclavicular incision to the access site. Fluoroscopic guidance was then used to cut the catheter to an appropriate length. The catheter was then placed through the peel-away sheath and the peel-away sheath was removed. The catheter tip was parked in excellent location under fluorocoscopic guidance in the cavoatrial junction. The pocket was then irrigated with antibiotic impregnated saline and the wound was closed with a running 3-0 Vicryl and a 4-0 Monocryl. The access incision was closed with a single 4-0 Monocryl. The Huber needle was used to withdraw blood and flush the port with heparinized saline. Dermabond was then placed as a dressing. The patient tolerated the procedure well and was taken to the recovery room in stable condition.   Leotis Pain 05/20/2019 2:42 PM   This note was created with Dragon Medical transcription system. Any errors in dictation are purely unintentional.

## 2019-05-20 NOTE — Telephone Encounter (Signed)
Omniseq test request faxed to [facility] pathology for specimen [phone].

## 2019-05-20 NOTE — H&P (Signed)
[facility] Admission History & Physical  MRN : [id number]  Kenneth Maxwell is a 64 y.o. (12-08-1955) male who presents with chief complaint of scheduled Port-A-Cath insertion.  History of Present Illness: Patient was admitted from 04/26/2019 - 04/29/2019 due to painless jaundice, generalized weakness. Bilirubin was elevated to 10, right upper quadrant ultrasound showed 3.6 cm hypoechoic mass along the posterior margin of the left liver lobe. Gallbladder sludge. Dilated CBD. MRCP showed hypoenhancing mass of the pancreatic head measuring about 3.6 cm, obstructing CBD near the ampulla consistent with pancreatic adenocarcinoma.  Hypoenhancing metastasis of liver and cholelithiasis. Patient was seen by gastroenterology.  Had a ERCP on 04/28/2019 which revealed a duodenal mass, single localized biliary stricture in the lower third of the main bile duct and dilated entire main bile duct.  A biliary sphincterotomy was performed.  Biliary stent was placed into the common bile duct.  Biopsy was performed.  Patient presents today for a scheduled Port-A-Cath insertion.  Current Facility-Administered Medications  Medication Dose Route Frequency Provider Last Rate Last Admin  . 0.9 %  sodium chloride infusion   Intravenous Continuous Eulogio Ditch E, NP      . ceFAZolin (ANCEF) 2-4 GM/100ML-% IVPB           . ceFAZolin (ANCEF) IVPB 2g/100 mL premix  2 g Intravenous Once Kris Hartmann, NP      . diphenhydrAMINE (BENADRYL) injection 50 mg  50 mg Intravenous Once PRN Kris Hartmann, NP      . famotidine (PEPCID) tablet 40 mg  40 mg Oral Once PRN Kris Hartmann, NP      . fentaNYL (SUBLIMAZE) injection 12.5 mcg  12.5 mcg Intravenous Once PRN Kris Hartmann, NP      . gentamicin (GARAMYCIN) 80 mg in sodium chloride 0.9 % 500 mL irrigation   Irrigation Once Eulogio Ditch E, NP      . methylPREDNISolone sodium succinate (SOLU-MEDROL) 125 mg/2 mL injection 125 mg  125 mg Intravenous  Once PRN Eulogio Ditch E, NP      . midazolam (VERSED) 2 MG/ML syrup 8 mg  8 mg Oral Once PRN Kris Hartmann, NP      . ondansetron (ZOFRAN) injection 4 mg  4 mg Intravenous Q6H PRN Kris Hartmann, NP       Past Medical History:  Diagnosis Date  . Alcohol abuse   . Arthritis   . Blood clotting disorder ([place])   . Chronic renal disease   . H/O urinary retention   . Hematuria, gross   . Hypertension   . Renal cell carcinoma ([place]) 11/2013   kidney cancer s/p R nephrectomy  . Right renal mass    Past Surgical History:  Procedure Laterality Date  . ERCP N/A 04/28/2019   Procedure: ENDOSCOPIC RETROGRADE CHOLANGIOPANCREATOGRAPHY (ERCP);  Surgeon: Lucilla Lame, MD;  Location: [facility] ENDOSCOPY;  Service: Endoscopy;  Laterality: N/A;  . kidney removed Right   . liver stent Right   . NEPHRECTOMY RADICAL Right 11/2013   R side kidney cancer surgery, non adrenal sparing   Social History Social History   Tobacco Use  . Smoking status: Current Every Day Smoker    Packs/day: 1.00    Years: 50.00    Pack years: 50.00    Types: Cigarettes  . Smokeless tobacco: Current User  Substance Use Topics  . Alcohol use: Not Currently    Alcohol/week: 49.0 standard drinks    Types: 49 Cans of beer per  week    Comment: hasn't drank in over 1 month  . Drug use: No   Family History Family History  Problem Relation Age of Onset  . Hypertension Mother   . Diabetes Mother   . Hypertension Father   . Heart attack Father   Denies for peripheral artery, venous  or renal disease  Allergies  Allergen Reactions  . Codeine Rash and Other (See Comments)   REVIEW OF SYSTEMS (Negative unless checked)  Constitutional: [] Weight loss  [] Fever  [] Chills Cardiac: [] Chest pain   [] Chest pressure   [] Palpitations   [] Shortness of breath when laying flat   [] Shortness of breath at rest   [] Shortness of breath with exertion. Vascular:  [] Pain in legs with walking   [] Pain in legs at rest   [] Pain in legs when  laying flat   [] Claudication   [] Pain in feet when walking  [] Pain in feet at rest  [] Pain in feet when laying flat   [] History of DVT   [] Phlebitis   [] Swelling in legs   [] Varicose veins   [] Non-healing ulcers Pulmonary:   [] Uses home oxygen   [] Productive cough   [] Hemoptysis   [] Wheeze  [] COPD   [] Asthma Neurologic:  [] Dizziness  [] Blackouts   [] Seizures   [] History of stroke   [] History of TIA  [] Aphasia   [] Temporary blindness   [] Dysphagia   [] Weakness or numbness in arms   [] Weakness or numbness in legs Musculoskeletal:  [] Arthritis   [] Joint swelling   [] Joint pain   [] Low back pain Hematologic:  [] Easy bruising  [] Easy bleeding   [] Hypercoagulable state   [] Anemic  [] Hepatitis Gastrointestinal:  [] Blood in stool   [] Vomiting blood  [] Gastroesophageal reflux/heartburn   [] Difficulty swallowing. Genitourinary:  [] Chronic kidney disease   [] Difficult urination  [] Frequent urination  [] Burning with urination   [] Blood in urine Skin:  [] Rashes   [] Ulcers   [] Wounds Psychological:  [] History of anxiety   []  History of major depression.  Positive for pancreatic cancer  Physical Examination  Vitals:   05/20/19 1322  BP: 106/70  Pulse: 85  Resp: 17  Temp: 98.6 F (37 C)  SpO2: 99%  Weight: 80.7 kg  Height: 5\' 11"  (1.803 m)   Body mass index is 24.83 kg/m. Gen: WD/WN, NAD Head: [place]/AT, No temporalis wasting.  Ear/Nose/Throat: Hearing grossly intact, nares w/o erythema or drainage, oropharynx w/o Erythema/Exudate, Eyes: Sclera non-icteric, conjunctiva clear Neck: Supple, no nuchal rigidity.  No JVD.  Pulmonary:  Good air movement, no increased work of respiration or use of accessory muscles  Cardiac: RRR, normal S1, S2, no Murmurs, rubs or gallops. Vascular:  Vessel Right Left  Radial Palpable Palpable  Ulnar Palpable Palpable  Brachial Palpable Palpable  Carotid Palpable, without bruit Palpable, without bruit  Aorta Not palpable N/A  Femoral Palpable Palpable  Popliteal  Palpable Palpable  PT Palpable Palpable  DP Palpable Palpable   Gastrointestinal: soft, non-tender/non-distended. No guarding/reflex. No masses, surgical incisions, or scars. Musculoskeletal: M/S 5/5 throughout.  No deformity or atrophy.  Mild edema Neurologic: Sensation grossly intact in extremities.  Symmetrical.  Speech is fluent. Motor exam as listed above. Psychiatric: Judgment intact, Mood & affect appropriate for pt's clinical situation. Dermatologic: No rashes or ulcers noted.  No cellulitis or open wounds. Lymph : No Cervical, Axillary, or Inguinal lymphadenopathy.  CBC Lab Results  Component Value Date   WBC 14.5 (H) 05/10/2019   HGB 15.2 05/10/2019   HCT 43.1 05/10/2019   MCV 87.6 05/10/2019  PLT 399 05/10/2019   BMET    Component Value Date/Time   NA 133 (L) 05/17/2019 0956   NA 140 11/13/2013 0422   K 4.3 05/17/2019 0956   K 3.6 11/13/2013 0422   CL 99 05/17/2019 0956   CL 104 11/13/2013 0422   CO2 24 05/17/2019 0956   CO2 30 11/13/2013 0422   GLUCOSE 86 05/17/2019 0956   GLUCOSE 90 11/13/2013 0422   BUN 14 05/17/2019 0956   BUN 9 11/13/2013 0422   CREATININE 1.08 05/17/2019 0956   CREATININE 1.38 (H) 04/25/2019 0920   CALCIUM 9.5 05/17/2019 0956   CALCIUM 7.9 (L) 11/13/2013 0422   GFRNONAA >60 05/17/2019 0956   GFRNONAA 55 (L) 04/25/2019 0920   GFRAA >60 05/17/2019 0956   GFRAA 63 04/25/2019 0920   Estimated Creatinine Clearance: 76.5 mL/min (by C-G formula based on SCr of 1.08 mg/dL).  COAG Lab Results  Component Value Date   INR 0.9 05/10/2019   INR 0.9 04/26/2019   INR 0.9 11/07/2013   Radiology DG Eye Foreign Body  Result Date: 04/26/2019 CLINICAL DATA:  Screening orbits for MRI EXAM: ORBITS FOR FOREIGN BODY - 2 VIEW COMPARISON:  10/31/2013 FINDINGS: Two frontal views of the orbits demonstrate no radiopaque foreign bodies. No bony abnormalities. Sinuses are clear. IMPRESSION: 1. No radiopaque foreign bodies. Electronically Signed   By:  Randa Ngo M.D.   On: 04/26/2019 15:18   CT CHEST W CONTRAST  Result Date: 04/29/2019 CLINICAL DATA:  Cancer of unknown primary. EXAM: CT CHEST WITH CONTRAST TECHNIQUE: Multidetector CT imaging of the chest was performed during intravenous contrast administration. CONTRAST:  71mL OMNIPAQUE IOHEXOL 300 MG/ML  SOLN COMPARISON:  April 26, 2019. November 11, 2013. FINDINGS: Cardiovascular: Atherosclerosis of thoracic aorta is noted without aneurysm or dissection. Normal cardiac size. No pericardial effusion. Mild coronary calcifications are noted. Mediastinum/Nodes: No enlarged mediastinal, hilar, or axillary lymph nodes. Thyroid gland, trachea, and esophagus demonstrate no significant findings. Lungs/Pleura: No pneumothorax or pleural effusion is noted. Mild emphysematous disease is noted in the upper lobes. 5 mm nodular density is seen in the right lower lobe just above the diaphragm best seen on image number 111 of series 3. Upper Abdomen: Liver mass and pancreatic mass are again noted as described on recent MRI. Musculoskeletal: No chest wall abnormality. No acute or significant osseous findings. IMPRESSION: 1. 5 mm nodular density is noted in right lower lobe just above the diaphragm. Given the recent diagnosis of pancreatic and hepatic masses, metastatic disease cannot be excluded and follow-up chest CT in 3 months is recommended. 2. Mild coronary calcifications are noted. 3. Liver mass and pancreatic mass are again noted as described on recent MRI. Aortic Atherosclerosis (ICD10-I70.0) and Emphysema (ICD10-J43.9). Electronically Signed   By: Marijo Conception M.D.   On: 04/29/2019 13:31   NM PET Image Initial (PI) Skull Base To Thigh  Result Date: 05/07/2019 CLINICAL DATA:  Initial treatment strategy for pancreatic cancer. History of renal cell carcinoma in 2016 with nephrectomy. EXAM: NUCLEAR MEDICINE PET SKULL BASE TO THIGH TECHNIQUE: 9.4 mCi F-18 FDG was injected intravenously. Full-ring PET imaging was  performed from the skull base to thigh after the radiotracer. CT data was obtained and used for attenuation correction and anatomic localization. Fasting blood glucose: 103 mg/dl COMPARISON:  04/29/2019 chest CT.  04/26/2019 abdominal MRI. FINDINGS: Mediastinal blood pool activity: SUV max 2.4 Liver activity: SUV max NA NECK: No areas of abnormal hypermetabolism. Incidental CT findings: Bilateral carotid atherosclerosis. No cervical  adenopathy. CHEST: No pulmonary parenchymal or thoracic nodal hypermetabolism. Incidental CT findings: Deferred to recent diagnostic CT. Aortic and coronary artery atherosclerosis. Centrilobular and paraseptal emphysema. ABDOMEN/PELVIS: Segment 2 lesion measures 2.9 cm and a S.U.V. max of 9.4 on 151/2. Segment 5 lesion measures 2.0 cm and a S.U.V. max of 4.8 on 179/3. Pancreatic head mass measures on the order of 3.5 cm and a S.U.V. max of 4.8 on 178/3. A node adjacent the SMA measures 8 mm and a S.U.V. max of 4.0 on 181/3. Gastric cardia underdistention and hypermetabolism, including at a S.U.V. max of 7.3 on 136/3. Incidental CT findings: Left adrenal thickening is low-density, suggesting hyperplasia are adenoma. Left renal vascular calcification. Right nephrectomy without local recurrence. Common duct stent in place. Small gallstone. Abdominal aortic atherosclerosis. SKELETON: No abnormal marrow activity. Incidental CT findings: Degenerative changes of both hips. IMPRESSION: 1. Hypermetabolic pancreatic primary with bilateral hepatic and peripancreatic nodal metastasis. 2. No evidence of hypermetabolic extraabdominal metastatic disease. 3. Right nephrectomy, without locally recurrent disease. 4. Proximal gastric hypermetabolism could be physiologic or represent gastritis. 5. Incidental findings, including: Aortic atherosclerosis (ICD10-I70.0), coronary artery atherosclerosis and emphysema (ICD10-J43.9). Cholelithiasis. Electronically Signed   By: Abigail Miyamoto M.D.   On: 05/07/2019  13:33   US BIOPSY (LIVER)  Result Date: 05/10/2019 INDICATION: Mass of the head of the pancreas with 2 lesions in the liver suspicious for metastatic disease. EXAM: ULTRASOUND GUIDED CORE BIOPSY OF LIVER MEDICATIONS: None. ANESTHESIA/SEDATION: Fentanyl 50 mcg IV; Versed 1.0 mg IV Moderate Sedation Time:  16 minutes. The patient was continuously monitored during the procedure by the interventional radiology nurse under my direct supervision. PROCEDURE: The procedure, risks, benefits, and alternatives were explained to the patient. Questions regarding the procedure were encouraged and answered. The patient understands and consents to the procedure. A time-out was performed prior to initiating the procedure. Ultrasound was utilized to localize a lesion within the left lobe of the liver. The anterior abdominal wall was prepped with chlorhexidine in a sterile fashion, and a sterile drape was applied covering the operative field. A sterile gown and sterile gloves were used for the procedure. Local anesthesia was provided with 1% Lidocaine. Under ultrasound guidance, a 72 gauge trocar needle was advanced to the level of a lesion within the posterior aspect of the left lobe of the liver. After confirming needle tip position, 3 separate coaxial 18 gauge core biopsy samples were obtained. Samples were submitted in formalin. A slurry of Gel-Foam pledgets mixed in sterile saline was then injected through the outer needle as the needle was retracted and removed. Additional ultrasound was performed. COMPLICATIONS: None immediate. FINDINGS: Well-circumscribed hypoechoic mass within the posterior aspect of the left lobe of the liver measures approximately 3.7 cm in greatest diameter. Solid tissue was obtained. IMPRESSION: Ultrasound-guided core biopsy performed of a lesion within the left lobe of the liver measuring approximately 3.7 cm in greatest diameter. Electronically Signed   By: Aletta Edouard M.D.   On: 05/10/2019 12:39    DG C-Arm 1-60 Min-No Report  Result Date: 04/28/2019 Fluoroscopy was utilized by the requesting physician.  No radiographic interpretation.   MR ABDOMEN MRCP W WO CONTAST  Result Date: 04/26/2019 CLINICAL DATA:  Liver lesion, cholelithiasis, history of renal cell carcinoma and right nephrectomy EXAM: MRI ABDOMEN WITHOUT AND WITH CONTRAST (INCLUDING MRCP) TECHNIQUE: Multiplanar multisequence MR imaging of the abdomen was performed both before and after the administration of intravenous contrast. Heavily T2-weighted images of the biliary and pancreatic ducts were obtained, and  three-dimensional MRCP images were rendered by post processing. CONTRAST:  7.52mL GADAVIST GADOBUTROL 1 MMOL/ML IV SOLN COMPARISON:  Abdominal ultrasound, 04/26/2019, CT abdomen pelvis, 06/30/2014, MR abdomen pelvis, 10/31/2013 FINDINGS: Lower chest: No acute findings. Hepatobiliary: There are T2 hyperintense, intrinsically T1 hypointense lesions of the subcapsular anterior right lobe of the liver adjacent to the gallbladder fossa, hepatic segment V, measuring 2.4 x 2.0 cm and of the posterior left lobe of the liver, segment III, measuring 3.7 x 2.4 cm. These lesions demonstrate central hypoenhancement with progressive rim enhancement on multiphasic contrast enhanced imaging. There are additional small, fluid signal nonenhancing cysts of the liver. Extrahepatic biliary ductal dilatation, with enlargement of the common bile duct up to 14 mm. Pancreas: There is a T2 hyperintense, slightly T1 hypointense, hypoenhancing mass of the pancreatic head measuring approximately 3.6 x 3.1 cm (series 21, image 54). This lesion is situated very laterally and near the ampulla, and does not closely abut the portal vein, superior mesenteric vein, celiac axis, superior mesenteric artery. Spleen:  Within normal limits in size and appearance. Adrenals/Urinary Tract: Status post right nephrectomy. No masses identified. No evidence of hydronephrosis.  Stomach/Bowel: Visualized portions within the abdomen are unremarkable. Vascular/Lymphatic: No pathologically enlarged lymph nodes identified. No abdominal aortic aneurysm demonstrated. Other:  None. Musculoskeletal: No suspicious bone lesions identified. IMPRESSION: 1. Hypoenhancing mass of the pancreatic head measuring approximately 3.6 cm, obstructing the common bile duct near the ampulla, consistent with pancreatic adenocarcinoma. The pancreatic duct is nondilated. 2. 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 3.  No significant lymphadenopathy. 4. Status post right nephrectomy. No evidence of malignant recurrence at the nephrectomy site. 5.  Cholelithiasis. Electronically Signed   By: Eddie Candle M.D.   On: 04/26/2019 16:56   US Abdomen Limited RUQ  Result Date: 04/26/2019 CLINICAL DATA:  Liver disease. History of a right nephrectomy for renal cell carcinoma. EXAM: ULTRASOUND ABDOMEN LIMITED RIGHT UPPER QUADRANT COMPARISON:  CT, 06/30/2014.  MRI, 10/31/2013. FINDINGS: Gallbladder: Distended. Some dependent sludge. 1.2 cm apparent nonshadowing gallstone. Several echogenic foci along the wall consistent with small polyps. The apparent stone could potentially reflect a polyp. No wall thickening. No pericholecystic fluid. Common bile duct: Diameter: 13 mm.  No visualized duct stone or sludge. Liver: Increased liver parenchymal echogenicity. Subtle nodular contour. There is a hypoechoic mass with a nearly anechoic partial rim, along the posterior left lobe, measuring 3.6 x 2.4 x 3.4 cm, not visualized on the prior MRI or CT. No other liver masses or lesions. Portal vein is patent on color Doppler imaging with normal direction of blood flow towards the liver. Other: None. IMPRESSION: 1. 3.6 cm hypoechoic mass along the posterior margin of the left liver lobe, not visualized on the prior studies. Recommend follow-up liver MRI without and with contrast for further assessment. 2.  Gallbladder sludge, probable small polyps and a 1.2 cm nonshadowing stone versus a larger polyp. No evidence of acute cholecystitis. 3. Dilated common bile duct to 1.3 cm, increased in size compared to the prior studies. No visualized duct stone. Recommend follow-up MRCP to accompany the liver MRI, for further assessment. Electronically Signed   By: Lajean Manes M.D.   On: 04/26/2019 12:19   Assessment/Plan The patient is a 63 year old male recently diagnosed with pancreatic cancer initiating chemotherapy he does not have an adequate access  1. Port-A-Cath: Patient with a recent diagnosis of pancreatic cancer.  Will be undergoing chemotherapy however does not have an adequate access at this  time.  Would place a Port-A-Cath to allow the patient to undergo chemotherapy treatments.  Procedure, risks and benefits explained to the patient.  All questions answered.  The patient wishes to proceed.  2. Tobacco Abuse: We had a discussion for approximately three minutes regarding the absolute need for smoking cessation due to the deleterious nature of tobacco on the vascular system. We discussed the tobacco use would diminish patency of any intervention, and likely significantly worsen progressio of disease. We discussed multiple agents for quitting including replacement therapy or medications to reduce cravings such as Chantix. The patient voices their understanding of the importance of smoking cessation.  3. Hypertension: On appropriate medications. Encouraged good control as its slows the progression of atherosclerotic disease.  Discussed with Dr. Mayme Genta, PA-C  05/20/2019 1:53 PM

## 2019-05-20 NOTE — Telephone Encounter (Signed)
Omniseq requisition emailed to Delfino Lovett (confirmed email was received).

## 2019-05-20 NOTE — H&P (Signed)
[facility] History & Physical Update  The patient was interviewed and re-examined.  The patient's previous History and Physical has been reviewed and is unchanged.  There is no change in the plan of care. We plan to proceed with the scheduled procedure.  Leotis Pain, MD  05/20/2019, 12:33 PM

## 2019-05-21 ENCOUNTER — Encounter: Payer: Self-pay | Admitting: Cardiology

## 2019-05-21 NOTE — Patient Instructions (Signed)
Irinotecan injection What is this medicine? IRINOTECAN (ir in oh TEE kan ) is a chemotherapy drug. It is used to treat colon and rectal cancer. This medicine may be used for other purposes; ask your health care provider or pharmacist if you have questions. COMMON BRAND NAME(S): Camptosar What should I tell my health care provider before I take this medicine? They need to know if you have any of these conditions:  dehydration  diarrhea  infection (especially a virus infection such as chickenpox, cold sores, or herpes)  liver disease  low blood counts, like low white cell, platelet, or red cell counts  low levels of calcium, magnesium, or potassium in the blood  recent or ongoing radiation therapy  an unusual or allergic reaction to irinotecan, other medicines, foods, dyes, or preservatives  pregnant or trying to get pregnant  breast-feeding How should I use this medicine? This drug is given as an infusion into a vein. It is administered in a hospital or clinic by a specially trained health care professional. Talk to your pediatrician regarding the use of this medicine in children. Special care may be needed. Overdosage: If you think you have taken too much of this medicine contact a poison control center or emergency room at once. NOTE: This medicine is only for you. Do not share this medicine with others. What if I miss a dose? It is important not to miss your dose. Call your doctor or health care professional if you are unable to keep an appointment. What may interact with this medicine? This medicine may interact with the following medications:  antiviral medicines for HIV or AIDS  certain antibiotics like rifampin or rifabutin  certain medicines for fungal infections like itraconazole, ketoconazole, posaconazole, and voriconazole  certain medicines for seizures like carbamazepine, phenobarbital, phenotoin  clarithromycin  gemfibrozil  nefazodone  [facility] John's  Wort This list may not describe all possible interactions. Give your health care provider a list of all the medicines, herbs, non-prescription drugs, or dietary supplements you use. Also tell them if you smoke, drink alcohol, or use illegal drugs. Some items may interact with your medicine. What should I watch for while using this medicine? Your condition will be monitored carefully while you are receiving this medicine. You will need important blood work done while you are taking this medicine. This drug may make you feel generally unwell. This is not uncommon, as chemotherapy can affect healthy cells as well as cancer cells. Report any side effects. Continue your course of treatment even though you feel ill unless your doctor tells you to stop. In some cases, you may be given additional medicines to help with side effects. Follow all directions for their use. You may get drowsy or dizzy. Do not drive, use machinery, or do anything that needs mental alertness until you know how this medicine affects you. Do not stand or sit up quickly, especially if you are an older patient. This reduces the risk of dizzy or fainting spells. Call your health care professional for advice if you get a fever, chills, or sore throat, or other symptoms of a cold or flu. Do not treat yourself. This medicine decreases your body's ability to fight infections. Try to avoid being around people who are sick. Avoid taking products that contain aspirin, acetaminophen, ibuprofen, naproxen, or ketoprofen unless instructed by your doctor. These medicines may hide a fever. This medicine may increase your risk to bruise or bleed. Call your doctor or health care professional if you notice  any unusual bleeding. Be careful brushing and flossing your teeth or using a toothpick because you may get an infection or bleed more easily. If you have any dental work done, tell your dentist you are receiving this medicine. Do not become pregnant while  taking this medicine or for 6 months after stopping it. Women should inform their health care professional if they wish to become pregnant or think they might be pregnant. Men should not father a child while taking this medicine and for 3 months after stopping it. There is potential for serious side effects to an unborn child. Talk to your health care professional for more information. Do not breast-feed an infant while taking this medicine or for 7 days after stopping it. This medicine has caused ovarian failure in some women. This medicine may make it more difficult to get pregnant. Talk to your health care professional if you are concerned about your fertility. This medicine has caused decreased sperm counts in some men. This may make it more difficult to father a child. Talk to your health care professional if you are concerned about your fertility. What side effects may I notice from receiving this medicine? Side effects that you should report to your doctor or health care professional as soon as possible:  allergic reactions like skin rash, itching or hives, swelling of the face, lips, or tongue  chest pain  diarrhea  flushing, runny nose, sweating during infusion  low blood counts - this medicine may decrease the number of white blood cells, red blood cells and platelets. You may be at increased risk for infections and bleeding.  nausea, vomiting  pain, swelling, warmth in the leg  signs of decreased platelets or bleeding - bruising, pinpoint red spots on the skin, black, tarry stools, blood in the urine  signs of infection - fever or chills, cough, sore throat, pain or difficulty passing urine  signs of decreased red blood cells - unusually weak or tired, fainting spells, lightheadedness Side effects that usually do not require medical attention (report to your doctor or health care professional if they continue or are bothersome):  constipation  hair loss  headache  loss of  appetite  mouth sores  stomach pain This list may not describe all possible side effects. Call your doctor for medical advice about side effects. You may report side effects to FDA at 1-800-FDA-1088. Where should I keep my medicine? This drug is given in a hospital or clinic and will not be stored at home. NOTE: This sheet is a summary. It may not cover all possible information. If you have questions about this medicine, talk to your doctor, pharmacist, or health care provider.  2020 Elsevier/Gold Standard (2018-02-09 10:09:17) Oxaliplatin Injection What is this medicine? OXALIPLATIN (ox AL i PLA tin) is a chemotherapy drug. It targets fast dividing cells, like cancer cells, and causes these cells to die. This medicine is used to treat cancers of the colon and rectum, and many other cancers. This medicine may be used for other purposes; ask your health care provider or pharmacist if you have questions. COMMON BRAND NAME(S): Eloxatin What should I tell my health care provider before I take this medicine? They need to know if you have any of these conditions:  heart disease  history of irregular heartbeat  liver disease  low blood counts, like white cells, platelets, or red blood cells  lung or breathing disease, like asthma  take medicines that treat or prevent blood clots  tingling of  the fingers or toes, or other nerve disorder  an unusual or allergic reaction to oxaliplatin, other chemotherapy, other medicines, foods, dyes, or preservatives  pregnant or trying to get pregnant  breast-feeding How should I use this medicine? This drug is given as an infusion into a vein. It is administered in a hospital or clinic by a specially trained health care professional. Talk to your pediatrician regarding the use of this medicine in children. Special care may be needed. Overdosage: If you think you have taken too much of this medicine contact a poison control center or emergency room  at once. NOTE: This medicine is only for you. Do not share this medicine with others. What if I miss a dose? It is important not to miss a dose. Call your doctor or health care professional if you are unable to keep an appointment. What may interact with this medicine? Do not take this medicine with any of the following medications:  cisapride  dronedarone  pimozide  thioridazine This medicine may also interact with the following medications:  aspirin and aspirin-like medicines  certain medicines that treat or prevent blood clots like warfarin, apixaban, dabigatran, and rivaroxaban  cisplatin  cyclosporine  diuretics  medicines for infection like acyclovir, adefovir, amphotericin B, bacitracin, cidofovir, foscarnet, ganciclovir, gentamicin, pentamidine, vancomycin  NSAIDs, medicines for pain and inflammation, like ibuprofen or naproxen  other medicines that prolong the QT interval (an abnormal heart rhythm)  pamidronate  zoledronic acid This list may not describe all possible interactions. Give your health care provider a list of all the medicines, herbs, non-prescription drugs, or dietary supplements you use. Also tell them if you smoke, drink alcohol, or use illegal drugs. Some items may interact with your medicine. What should I watch for while using this medicine? Your condition will be monitored carefully while you are receiving this medicine. You may need blood work done while you are taking this medicine. This medicine may make you feel generally unwell. This is not uncommon as chemotherapy can affect healthy cells as well as cancer cells. Report any side effects. Continue your course of treatment even though you feel ill unless your healthcare professional tells you to stop. This medicine can make you more sensitive to cold. Do not drink cold drinks or use ice. Cover exposed skin before coming in contact with cold temperatures or cold objects. When out in cold weather  wear warm clothing and cover your mouth and nose to warm the air that goes into your lungs. Tell your doctor if you get sensitive to the cold. Do not become pregnant while taking this medicine or for 9 months after stopping it. Women should inform their health care professional if they wish to become pregnant or think they might be pregnant. Men should not father a child while taking this medicine and for 6 months after stopping it. There is potential for serious side effects to an unborn child. Talk to your health care professional for more information. Do not breast-feed a child while taking this medicine or for 3 months after stopping it. This medicine has caused ovarian failure in some women. This medicine may make it more difficult to get pregnant. Talk to your health care professional if you are concerned about your fertility. This medicine has caused decreased sperm counts in some men. This may make it more difficult to father a child. Talk to your health care professional if you are concerned about your fertility. This medicine may increase your risk of getting an  infection. Call your health care professional for advice if you get a fever, chills, or sore throat, or other symptoms of a cold or flu. Do not treat yourself. Try to avoid being around people who are sick. Avoid taking medicines that contain aspirin, acetaminophen, ibuprofen, naproxen, or ketoprofen unless instructed by your health care professional. These medicines may hide a fever. Be careful brushing or flossing your teeth or using a toothpick because you may get an infection or bleed more easily. If you have any dental work done, tell your dentist you are receiving this medicine. What side effects may I notice from receiving this medicine? Side effects that you should report to your doctor or health care professional as soon as possible:  allergic reactions like skin rash, itching or hives, swelling of the face, lips, or  tongue  breathing problems  cough  low blood counts - this medicine may decrease the number of white blood cells, red blood cells, and platelets. You may be at increased risk for infections and bleeding  nausea, vomiting  pain, redness, or irritation at site where injected  pain, tingling, numbness in the hands or feet  signs and symptoms of bleeding such as bloody or black, tarry stools; red or dark brown urine; spitting up blood or brown material that looks like coffee grounds; red spots on the skin; unusual bruising or bleeding from the eyes, gums, or nose  signs and symptoms of a dangerous change in heartbeat or heart rhythm like chest pain; dizziness; fast, irregular heartbeat; palpitations; feeling faint or lightheaded; falls  signs and symptoms of infection like fever; chills; cough; sore throat; pain or trouble passing urine  signs and symptoms of liver injury like dark yellow or brown urine; general ill feeling or flu-like symptoms; light-colored stools; loss of appetite; nausea; right upper belly pain; unusually weak or tired; yellowing of the eyes or skin  signs and symptoms of low red blood cells or anemia such as unusually weak or tired; feeling faint or lightheaded; falls  signs and symptoms of muscle injury like dark urine; trouble passing urine or change in the amount of urine; unusually weak or tired; muscle pain; back pain Side effects that usually do not require medical attention (report to your doctor or health care professional if they continue or are bothersome):  changes in taste  diarrhea  gas  hair loss  loss of appetite  mouth sores This list may not describe all possible side effects. Call your doctor for medical advice about side effects. You may report side effects to FDA at 1-800-FDA-1088. Where should I keep my medicine? This drug is given in a hospital or clinic and will not be stored at home. NOTE: This sheet is a summary. It may not cover all  possible information. If you have questions about this medicine, talk to your doctor, pharmacist, or health care provider.  2020 Elsevier/Gold Standard (2018-05-09 12:20:35) Fluorouracil, 5-FU injection What is this medicine? FLUOROURACIL, 5-FU (flure oh YOOR a sil) is a chemotherapy drug. It slows the growth of cancer cells. This medicine is used to treat many types of cancer like breast cancer, colon or rectal cancer, pancreatic cancer, and stomach cancer. This medicine may be used for other purposes; ask your health care provider or pharmacist if you have questions. COMMON BRAND NAME(S): Adrucil What should I tell my health care provider before I take this medicine? They need to know if you have any of these conditions:  blood disorders  dihydropyrimidine dehydrogenase (  DPD) deficiency  infection (especially a virus infection such as chickenpox, cold sores, or herpes)  kidney disease  liver disease  malnourished, poor nutrition  recent or ongoing radiation therapy  an unusual or allergic reaction to fluorouracil, other chemotherapy, other medicines, foods, dyes, or preservatives  pregnant or trying to get pregnant  breast-feeding How should I use this medicine? This drug is given as an infusion or injection into a vein. It is administered in a hospital or clinic by a specially trained health care professional. Talk to your pediatrician regarding the use of this medicine in children. Special care may be needed. Overdosage: If you think you have taken too much of this medicine contact a poison control center or emergency room at once. NOTE: This medicine is only for you. Do not share this medicine with others. What if I miss a dose? It is important not to miss your dose. Call your doctor or health care professional if you are unable to keep an appointment. What may interact with this  medicine?  allopurinol  cimetidine  dapsone  digoxin  hydroxyurea  leucovorin  levamisole  medicines for seizures like ethotoin, fosphenytoin, phenytoin  medicines to increase blood counts like filgrastim, pegfilgrastim, sargramostim  medicines that treat or prevent blood clots like warfarin, enoxaparin, and dalteparin  methotrexate  metronidazole  pyrimethamine  some other chemotherapy drugs like busulfan, cisplatin, estramustine, vinblastine  trimethoprim  trimetrexate  vaccines Talk to your doctor or health care professional before taking any of these medicines:  acetaminophen  aspirin  ibuprofen  ketoprofen  naproxen This list may not describe all possible interactions. Give your health care provider a list of all the medicines, herbs, non-prescription drugs, or dietary supplements you use. Also tell them if you smoke, drink alcohol, or use illegal drugs. Some items may interact with your medicine. What should I watch for while using this medicine? Visit your doctor for checks on your progress. This drug may make you feel generally unwell. This is not uncommon, as chemotherapy can affect healthy cells as well as cancer cells. Report any side effects. Continue your course of treatment even though you feel ill unless your doctor tells you to stop. In some cases, you may be given additional medicines to help with side effects. Follow all directions for their use. Call your doctor or health care professional for advice if you get a fever, chills or sore throat, or other symptoms of a cold or flu. Do not treat yourself. This drug decreases your body's ability to fight infections. Try to avoid being around people who are sick. This medicine may increase your risk to bruise or bleed. Call your doctor or health care professional if you notice any unusual bleeding. Be careful brushing and flossing your teeth or using a toothpick because you may get an infection or bleed  more easily. If you have any dental work done, tell your dentist you are receiving this medicine. Avoid taking products that contain aspirin, acetaminophen, ibuprofen, naproxen, or ketoprofen unless instructed by your doctor. These medicines may hide a fever. Do not become pregnant while taking this medicine. Women should inform their doctor if they wish to become pregnant or think they might be pregnant. There is a potential for serious side effects to an unborn child. Talk to your health care professional or pharmacist for more information. Do not breast-feed an infant while taking this medicine. Men should inform their doctor if they wish to father a child. This medicine may lower sperm  counts. Do not treat diarrhea with over the counter products. Contact your doctor if you have diarrhea that lasts more than 2 days or if it is severe and watery. This medicine can make you more sensitive to the sun. Keep out of the sun. If you cannot avoid being in the sun, wear protective clothing and use sunscreen. Do not use sun lamps or tanning beds/booths. What side effects may I notice from receiving this medicine? Side effects that you should report to your doctor or health care professional as soon as possible:  allergic reactions like skin rash, itching or hives, swelling of the face, lips, or tongue  low blood counts - this medicine may decrease the number of white blood cells, red blood cells and platelets. You may be at increased risk for infections and bleeding.  signs of infection - fever or chills, cough, sore throat, pain or difficulty passing urine  signs of decreased platelets or bleeding - bruising, pinpoint red spots on the skin, black, tarry stools, blood in the urine  signs of decreased red blood cells - unusually weak or tired, fainting spells, lightheadedness  breathing problems  changes in vision  chest pain  mouth sores  nausea and vomiting  pain, swelling, redness at site  where injected  pain, tingling, numbness in the hands or feet  redness, swelling, or sores on hands or feet  stomach pain  unusual bleeding Side effects that usually do not require medical attention (report to your doctor or health care professional if they continue or are bothersome):  changes in finger or toe nails  diarrhea  dry or itchy skin  hair loss  headache  loss of appetite  sensitivity of eyes to the light  stomach upset  unusually teary eyes This list may not describe all possible side effects. Call your doctor for medical advice about side effects. You may report side effects to FDA at 1-800-FDA-1088. Where should I keep my medicine? This drug is given in a hospital or clinic and will not be stored at home. NOTE: This sheet is a summary. It may not cover all possible information. If you have questions about this medicine, talk to your doctor, pharmacist, or health care provider.  2020 Elsevier/Gold Standard (2007-04-25 13:53:16) Leucovorin injection What is this medicine? LEUCOVORIN (loo koe VOR in) is used to prevent or treat the harmful effects of some medicines. This medicine is used to treat anemia caused by a low amount of folic acid in the body. It is also used with 5-fluorouracil (5-FU) to treat colon cancer. This medicine may be used for other purposes; ask your health care provider or pharmacist if you have questions. What should I tell my health care provider before I take this medicine? They need to know if you have any of these conditions:  anemia from low levels of vitamin B-12 in the blood  an unusual or allergic reaction to leucovorin, folic acid, other medicines, foods, dyes, or preservatives  pregnant or trying to get pregnant  breast-feeding How should I use this medicine? This medicine is for injection into a muscle or into a vein. It is given by a health care professional in a hospital or clinic setting. Talk to your pediatrician  regarding the use of this medicine in children. Special care may be needed. Overdosage: If you think you have taken too much of this medicine contact a poison control center or emergency room at once. NOTE: This medicine is only for you. Do not share  this medicine with others. What if I miss a dose? This does not apply. What may interact with this medicine?  capecitabine  fluorouracil  phenobarbital  phenytoin  primidone  trimethoprim-sulfamethoxazole This list may not describe all possible interactions. Give your health care provider a list of all the medicines, herbs, non-prescription drugs, or dietary supplements you use. Also tell them if you smoke, drink alcohol, or use illegal drugs. Some items may interact with your medicine. What should I watch for while using this medicine? Your condition will be monitored carefully while you are receiving this medicine. This medicine may increase the side effects of 5-fluorouracil, 5-FU. Tell your doctor or health care professional if you have diarrhea or mouth sores that do not get better or that get worse. What side effects may I notice from receiving this medicine? Side effects that you should report to your doctor or health care professional as soon as possible:  allergic reactions like skin rash, itching or hives, swelling of the face, lips, or tongue  breathing problems  fever, infection  mouth sores  unusual bleeding or bruising  unusually weak or tired Side effects that usually do not require medical attention (report to your doctor or health care professional if they continue or are bothersome):  constipation or diarrhea  loss of appetite  nausea, vomiting This list may not describe all possible side effects. Call your doctor for medical advice about side effects. You may report side effects to FDA at 1-800-FDA-1088. Where should I keep my medicine? This drug is given in a hospital or clinic and will not be stored at  home. NOTE: This sheet is a summary. It may not cover all possible information. If you have questions about this medicine, talk to your doctor, pharmacist, or health care provider.  2020 Elsevier/Gold Standard (2007-06-26 16:50:29)  

## 2019-05-22 ENCOUNTER — Telehealth: Payer: Self-pay | Admitting: Oncology

## 2019-05-22 ENCOUNTER — Inpatient Hospital Stay: Payer: BC Managed Care – PPO | Admitting: Hospice and Palliative Medicine

## 2019-05-22 ENCOUNTER — Other Ambulatory Visit: Payer: Self-pay | Admitting: Oncology

## 2019-05-22 MED ORDER — OXYCODONE HCL 5 MG PO TABS: 5.0000 mg | ORAL_TABLET | Freq: Four times a day (QID) | ORAL | 0 refills | Status: DC | PRN

## 2019-05-22 NOTE — Telephone Encounter (Signed)
I refilled his oxycodone for another 30 tablets. He needs to see Vonna Kotyk.

## 2019-05-22 NOTE — Telephone Encounter (Signed)
Please advise 

## 2019-05-22 NOTE — Telephone Encounter (Signed)
Patient's wife phoned early this AM and left voicemail requesting that Dr. Tasia Catchings prescribe patient something new for pain. Information passed to Dr. Collie Siad team.

## 2019-05-22 NOTE — Telephone Encounter (Signed)
LM informing wife of Oxycodone refill sent to pharmacy.  He does have an appt with Josh on 5/21.  Requested her to return call if she has any other questions or concerns.

## 2019-05-23 ENCOUNTER — Inpatient Hospital Stay (HOSPITAL_BASED_OUTPATIENT_CLINIC_OR_DEPARTMENT_OTHER): Payer: BC Managed Care – PPO | Admitting: Oncology

## 2019-05-23 ENCOUNTER — Inpatient Hospital Stay: Payer: BC Managed Care – PPO

## 2019-05-23 ENCOUNTER — Other Ambulatory Visit: Payer: Self-pay

## 2019-05-23 DIAGNOSIS — Z85528 Personal history of other malignant neoplasm of kidney: Secondary | ICD-10-CM | POA: Diagnosis not present

## 2019-05-23 DIAGNOSIS — C259 Malignant neoplasm of pancreas, unspecified: Secondary | ICD-10-CM | POA: Diagnosis not present

## 2019-05-23 DIAGNOSIS — T402X5A Adverse effect of other opioids, initial encounter: Secondary | ICD-10-CM | POA: Diagnosis not present

## 2019-05-23 DIAGNOSIS — G893 Neoplasm related pain (acute) (chronic): Secondary | ICD-10-CM | POA: Diagnosis not present

## 2019-05-23 DIAGNOSIS — I10 Essential (primary) hypertension: Secondary | ICD-10-CM | POA: Diagnosis not present

## 2019-05-23 DIAGNOSIS — E876 Hypokalemia: Secondary | ICD-10-CM | POA: Diagnosis not present

## 2019-05-23 DIAGNOSIS — N189 Chronic kidney disease, unspecified: Secondary | ICD-10-CM | POA: Diagnosis not present

## 2019-05-23 DIAGNOSIS — F419 Anxiety disorder, unspecified: Secondary | ICD-10-CM | POA: Diagnosis not present

## 2019-05-23 DIAGNOSIS — C787 Secondary malignant neoplasm of liver and intrahepatic bile duct: Secondary | ICD-10-CM | POA: Diagnosis not present

## 2019-05-23 DIAGNOSIS — F17210 Nicotine dependence, cigarettes, uncomplicated: Secondary | ICD-10-CM | POA: Diagnosis not present

## 2019-05-23 DIAGNOSIS — Z5111 Encounter for antineoplastic chemotherapy: Secondary | ICD-10-CM | POA: Diagnosis not present

## 2019-05-23 DIAGNOSIS — K5903 Drug induced constipation: Secondary | ICD-10-CM | POA: Diagnosis not present

## 2019-05-23 DIAGNOSIS — C772 Secondary and unspecified malignant neoplasm of intra-abdominal lymph nodes: Secondary | ICD-10-CM | POA: Diagnosis not present

## 2019-05-23 DIAGNOSIS — E871 Hypo-osmolality and hyponatremia: Secondary | ICD-10-CM | POA: Diagnosis not present

## 2019-05-23 DIAGNOSIS — Z5189 Encounter for other specified aftercare: Secondary | ICD-10-CM | POA: Diagnosis not present

## 2019-05-23 DIAGNOSIS — Z905 Acquired absence of kidney: Secondary | ICD-10-CM | POA: Diagnosis not present

## 2019-05-23 NOTE — Progress Notes (Signed)
[place]  Telephone:([phone] Fax:([phone]  Patient Care Team: Olin Hauser, DO as PCP - General (Family Medicine) Clent Jacks, RN as Oncology Nurse Navigator   Name of the patient: Kenneth Maxwell  [id number]  01-Mar-1955   Date of visit: 05/23/19  Diagnosis- pancreatic cancer   Chief complaint/Reason for visit- Initial Meeting for [facility], preparing for starting chemotherapy  Heme/Onc history:  Oncology History  Pancreatic adenocarcinoma ([place])  04/26/2019 Initial Diagnosis   Pancreatic adenocarcinoma ([place])   05/24/2019 -  Chemotherapy   The patient had palonosetron (ALOXI) injection 0.25 mg, 0.25 mg, Intravenous,  Once, 0 of 4 cycles pegfilgrastim (NEULASTA ONPRO KIT) injection 6 mg, 6 mg, Subcutaneous, Once, 0 of 4 cycles irinotecan (CAMPTOSAR) 300 mg in sodium chloride 0.9 % 500 mL chemo infusion, 150 mg/m2, Intravenous,  Once, 0 of 4 cycles oxaliplatin (ELOXATIN) 170 mg in dextrose 5 % 500 mL chemo infusion, 85 mg/m2, Intravenous,  Once, 0 of 4 cycles fosaprepitant (EMEND) 150 mg in sodium chloride 0.9 % 145 mL IVPB, 150 mg, Intravenous,  Once, 0 of 4 cycles fluorouracil (ADRUCIL) 4,850 mg in sodium chloride 0.9 % 53 mL chemo infusion, 2,400 mg/m2 = 4,850 mg, Intravenous, 1 Day/Dose, 0 of 4 cycles leucovorin 808 mg in sodium chloride 0.9 % 250 mL infusion, 400 mg/m2, Intravenous,  Once, 0 of 4 cycles  for chemotherapy treatment.      Interval history-  Kenneth Maxwell is a 64 yo male who presents to chemo care clinic today for initial meeting in preparation for starting chemotherapy. I introduced the chemo care clinic and we discussed that the role of the clinic is to assist those who are at an increased risk of emergency room visits and/or complications during the course of chemotherapy treatment. We discussed that the increased risk takes into account factors such as age, performance status, and  co-morbidities. We also discussed that for some, this might include barriers to care such as not having a primary care provider, lack of insurance/transportation, or not being able to afford medications. We discussed that the goal of the program is to help prevent unplanned ER visits and help reduce complications during chemotherapy. We do this by discussing specific risk factors to each individual and identifying ways that we can help improve these risk factors and reduce barriers to care.   ECOG FS:1 - Symptomatic but completely ambulatory  Review of systems- Review of Systems  Constitutional: Positive for malaise/fatigue. Negative for chills, fever and weight loss.  HENT: Negative for congestion, ear pain and tinnitus.   Eyes: Negative.  Negative for blurred vision and double vision.  Respiratory: Negative.  Negative for cough, sputum production and shortness of breath.   Cardiovascular: Negative.  Negative for chest pain, palpitations and leg swelling.  Gastrointestinal: Positive for abdominal pain and constipation. Negative for diarrhea, nausea and vomiting.  Genitourinary: Negative for dysuria, frequency and urgency.  Musculoskeletal: Negative for back pain and falls.  Skin: Negative.  Negative for rash.  Neurological: Negative.  Negative for weakness and headaches.  Endo/Heme/Allergies: Negative.  Does not bruise/bleed easily.  Psychiatric/Behavioral: Negative.  Negative for depression. The patient is not nervous/anxious and does not have insomnia.      Current treatment- modified FOLFIRINOX 05/24/19  Allergies  Allergen Reactions  . Codeine Rash and Other (See Comments)    Past Medical History:  Diagnosis Date  . Alcohol abuse   . Arthritis   . Blood  clotting disorder ([place])   . Chronic renal disease   . H/O urinary retention   . Hematuria, gross   . Hypertension   . Renal cell carcinoma ([place]) 11/2013   kidney cancer s/p R nephrectomy  . Right renal mass     Past  Surgical History:  Procedure Laterality Date  . ERCP N/A 04/28/2019   Procedure: ENDOSCOPIC RETROGRADE CHOLANGIOPANCREATOGRAPHY (ERCP);  Surgeon: Lucilla Lame, MD;  Location: [facility] ENDOSCOPY;  Service: Endoscopy;  Laterality: N/A;  . kidney removed Right   . liver stent Right   . NEPHRECTOMY RADICAL Right 11/2013   R side kidney cancer surgery, non adrenal sparing  . PORTA CATH INSERTION N/A 05/20/2019   Procedure: PORTA CATH INSERTION;  Surgeon: Algernon Huxley, MD;  Location: [place] CV LAB;  Service: Cardiovascular;  Laterality: N/A;    Social History   Socioeconomic History  . Marital status: Married    Spouse name: Not on file  . Number of children: Not on file  . Years of education: Not on file  . Highest education level: Not on file  Occupational History  . Not on file  Tobacco Use  . Smoking status: Current Every Day Smoker    Packs/day: 1.00    Years: 50.00    Pack years: 50.00    Types: Cigarettes  . Smokeless tobacco: Current User  Substance and Sexual Activity  . Alcohol use: Not Currently    Alcohol/week: 49.0 standard drinks    Types: 49 Cans of beer per week    Comment: hasn't drank in over 1 month  . Drug use: No  . Sexual activity: Yes  Other Topics Concern  . Not on file  Social History Narrative  . Not on file   Social Determinants of Health   Financial Resource Strain:   . Difficulty of Paying Living Expenses:   Food Insecurity:   . Worried About Charity fundraiser in the Last Year:   . Arboriculturist in the Last Year:   Transportation Needs:   . Film/video editor (Medical):   Marland Kitchen Lack of Transportation (Non-Medical):   Physical Activity:   . Days of Exercise per Week:   . Minutes of Exercise per Session:   Stress:   . Feeling of Stress :   Social Connections:   . Frequency of Communication with Friends and Family:   . Frequency of Social Gatherings with Friends and Family:   . Attends Religious Services:   . Active Member of Clubs  or Organizations:   . Attends Archivist Meetings:   Marland Kitchen Marital Status:   Intimate Partner Violence:   . Fear of Current or Ex-Partner:   . Emotionally Abused:   Marland Kitchen Physically Abused:   . Sexually Abused:     Family History  Problem Relation Age of Onset  . Hypertension Mother   . Diabetes Mother   . Hypertension Father   . Heart attack Father      Current Outpatient Medications:  .  amLODipine (NORVASC) 10 MG tablet, Take 1 tablet (10 mg total) by mouth daily., Disp: 90 tablet, Rfl: 3 .  dexamethasone (DECADRON) 4 MG tablet, Take 2 tablets (8 mg total) by mouth daily. Start the day after chemotherapy for 3 days. Take with food. (Patient not taking: Reported on 05/20/2019), Disp: 8 tablet, Rfl: 5 .  lidocaine-prilocaine (EMLA) cream, Apply to affected area once, Disp: 30 g, Rfl: 3 .  loperamide (IMODIUM A-D) 2 MG tablet,  Take 2 at onset of diarrhea, then 1 every 2hrs until 12hr without a BM. May take 2 tab every 4hrs at bedtime. If diarrhea recurs repeat., Disp: 100 tablet, Rfl: 1 .  losartan (COZAAR) 100 MG tablet, Take 1 tablet (100 mg total) by mouth daily., Disp: 90 tablet, Rfl: 3 .  Multiple Vitamin (MULTIVITAMIN WITH MINERALS) TABS tablet, Take 1 tablet by mouth daily., Disp:  , Rfl:  .  oxyCODONE (ROXICODONE) 5 MG immediate release tablet, Take 1 tablet (5 mg total) by mouth every 6 (six) hours as needed for severe pain., Disp: 30 tablet, Rfl: 0 .  prochlorperazine (COMPAZINE) 10 MG tablet, Take 1 tablet (10 mg total) by mouth every 6 (six) hours as needed (Nausea or vomiting)., Disp: 30 tablet, Rfl: 1 .  spironolactone (ALDACTONE) 25 MG tablet, Take 1 tablet (25 mg total) by mouth daily., Disp: 30 tablet, Rfl: 1  Physical exam: There were no vitals filed for this visit. Physical Exam Constitutional:      Appearance: Normal appearance.  HENT:     Head: Normocephalic and atraumatic.  Eyes:     Pupils: Pupils are equal, round, and reactive to light.  Cardiovascular:       Rate and Rhythm: Normal rate and regular rhythm.     Heart sounds: Normal heart sounds. No murmur.  Pulmonary:     Effort: Pulmonary effort is normal.     Breath sounds: Normal breath sounds. No wheezing.  Abdominal:     General: Bowel sounds are normal. There is no distension.     Palpations: Abdomen is soft.     Tenderness: There is no abdominal tenderness.  Musculoskeletal:        General: Normal range of motion.     Cervical back: Normal range of motion.  Skin:    General: Skin is warm and dry.     Coloration: Skin is jaundiced.     Findings: No rash.  Neurological:     Mental Status: He is alert and oriented to person, place, and time.  Psychiatric:        Judgment: Judgment normal.      CMP Latest Ref Rng & Units 05/17/2019  Glucose 70 - 99 mg/dL 86  BUN 8 - 23 mg/dL 14  Creatinine 0.61 - 1.24 mg/dL 1.08  Sodium 135 - 145 mmol/L 133(L)  Potassium 3.5 - 5.1 mmol/L 4.3  Chloride 98 - 111 mmol/L 99  CO2 22 - 32 mmol/L 24  Calcium 8.9 - 10.3 mg/dL 9.5  Total Protein 6.5 - 8.1 g/dL 7.6  Total Bilirubin 0.3 - 1.2 mg/dL 2.1(H)  Alkaline Phos 38 - 126 U/L 139(H)  AST 15 - 41 U/L 27  ALT 0 - 44 U/L 53(H)   CBC Latest Ref Rng & Units 05/10/2019  WBC 4.0 - 10.5 K/uL 14.5(H)  Hemoglobin 13.0 - 17.0 g/dL 15.2  Hematocrit 39.0 - 52.0 % 43.1  Platelets 150 - 400 K/uL 399    No images are attached to the encounter.  DG Eye Foreign Body  Result Date: 04/26/2019 CLINICAL DATA:  Screening orbits for MRI EXAM: ORBITS FOR FOREIGN BODY - 2 VIEW COMPARISON:  10/31/2013 FINDINGS: Two frontal views of the orbits demonstrate no radiopaque foreign bodies. No bony abnormalities. Sinuses are clear. IMPRESSION: 1. No radiopaque foreign bodies. Electronically Signed   By: Randa Ngo M.D.   On: 04/26/2019 15:18   CT CHEST W CONTRAST  Result Date: 04/29/2019 CLINICAL DATA:  Cancer of unknown primary. EXAM: CT  CHEST WITH CONTRAST TECHNIQUE: Multidetector CT imaging of the chest was  performed during intravenous contrast administration. CONTRAST:  66m OMNIPAQUE IOHEXOL 300 MG/ML  SOLN COMPARISON:  April 26, 2019. November 11, 2013. FINDINGS: Cardiovascular: Atherosclerosis of thoracic aorta is noted without aneurysm or dissection. Normal cardiac size. No pericardial effusion. Mild coronary calcifications are noted. Mediastinum/Nodes: No enlarged mediastinal, hilar, or axillary lymph nodes. Thyroid gland, trachea, and esophagus demonstrate no significant findings. Lungs/Pleura: No pneumothorax or pleural effusion is noted. Mild emphysematous disease is noted in the upper lobes. 5 mm nodular density is seen in the right lower lobe just above the diaphragm best seen on image number 111 of series 3. Upper Abdomen: Liver mass and pancreatic mass are again noted as described on recent MRI. Musculoskeletal: No chest wall abnormality. No acute or significant osseous findings. IMPRESSION: 1. 5 mm nodular density is noted in right lower lobe just above the diaphragm. Given the recent diagnosis of pancreatic and hepatic masses, metastatic disease cannot be excluded and follow-up chest CT in 3 months is recommended. 2. Mild coronary calcifications are noted. 3. Liver mass and pancreatic mass are again noted as described on recent MRI. Aortic Atherosclerosis (ICD10-I70.0) and Emphysema (ICD10-J43.9). Electronically Signed   By: JMarijo ConceptionM.D.   On: 04/29/2019 13:31   PERIPHERAL VASCULAR CATHETERIZATION  Result Date: 05/20/2019 See op note  NM PET Image Initial (PI) Skull Base To Thigh  Result Date: 05/07/2019 CLINICAL DATA:  Initial treatment strategy for pancreatic cancer. History of renal cell carcinoma in 2016 with nephrectomy. EXAM: NUCLEAR MEDICINE PET SKULL BASE TO THIGH TECHNIQUE: 9.4 mCi F-18 FDG was injected intravenously. Full-ring PET imaging was performed from the skull base to thigh after the radiotracer. CT data was obtained and used for attenuation correction and anatomic  localization. Fasting blood glucose: 103 mg/dl COMPARISON:  04/29/2019 chest CT.  04/26/2019 abdominal MRI. FINDINGS: Mediastinal blood pool activity: SUV max 2.4 Liver activity: SUV max NA NECK: No areas of abnormal hypermetabolism. Incidental CT findings: Bilateral carotid atherosclerosis. No cervical adenopathy. CHEST: No pulmonary parenchymal or thoracic nodal hypermetabolism. Incidental CT findings: Deferred to recent diagnostic CT. Aortic and coronary artery atherosclerosis. Centrilobular and paraseptal emphysema. ABDOMEN/PELVIS: Segment 2 lesion measures 2.9 cm and a S.U.V. max of 9.4 on 151/2. Segment 5 lesion measures 2.0 cm and a S.U.V. max of 4.8 on 179/3. Pancreatic head mass measures on the order of 3.5 cm and a S.U.V. max of 4.8 on 178/3. A node adjacent the SMA measures 8 mm and a S.U.V. max of 4.0 on 181/3. Gastric cardia underdistention and hypermetabolism, including at a S.U.V. max of 7.3 on 136/3. Incidental CT findings: Left adrenal thickening is low-density, suggesting hyperplasia are adenoma. Left renal vascular calcification. Right nephrectomy without local recurrence. Common duct stent in place. Small gallstone. Abdominal aortic atherosclerosis. SKELETON: No abnormal marrow activity. Incidental CT findings: Degenerative changes of both hips. IMPRESSION: 1. Hypermetabolic pancreatic primary with bilateral hepatic and peripancreatic nodal metastasis. 2. No evidence of hypermetabolic extraabdominal metastatic disease. 3. Right nephrectomy, without locally recurrent disease. 4. Proximal gastric hypermetabolism could be physiologic or represent gastritis. 5. Incidental findings, including: Aortic atherosclerosis (ICD10-I70.0), coronary artery atherosclerosis and emphysema (ICD10-J43.9). Cholelithiasis. Electronically Signed   By: KAbigail MiyamotoM.D.   On: 05/07/2019 13:33   [place]BIOPSY (LIVER)  Result Date: 05/10/2019 INDICATION: Mass of the head of the pancreas with 2 lesions in the liver  suspicious for metastatic disease. EXAM: ULTRASOUND GUIDED CORE BIOPSY OF LIVER MEDICATIONS: None. ANESTHESIA/SEDATION:  Fentanyl 50 mcg IV; Versed 1.0 mg IV Moderate Sedation Time:  16 minutes. The patient was continuously monitored during the procedure by the interventional radiology nurse under my direct supervision. PROCEDURE: The procedure, risks, benefits, and alternatives were explained to the patient. Questions regarding the procedure were encouraged and answered. The patient understands and consents to the procedure. A time-out was performed prior to initiating the procedure. Ultrasound was utilized to localize a lesion within the left lobe of the liver. The anterior abdominal wall was prepped with chlorhexidine in a sterile fashion, and a sterile drape was applied covering the operative field. A sterile gown and sterile gloves were used for the procedure. Local anesthesia was provided with 1% Lidocaine. Under ultrasound guidance, a 25 gauge trocar needle was advanced to the level of a lesion within the posterior aspect of the left lobe of the liver. After confirming needle tip position, 3 separate coaxial 18 gauge core biopsy samples were obtained. Samples were submitted in formalin. A slurry of Gel-Foam pledgets mixed in sterile saline was then injected through the outer needle as the needle was retracted and removed. Additional ultrasound was performed. COMPLICATIONS: None immediate. FINDINGS: Well-circumscribed hypoechoic mass within the posterior aspect of the left lobe of the liver measures approximately 3.7 cm in greatest diameter. Solid tissue was obtained. IMPRESSION: Ultrasound-guided core biopsy performed of a lesion within the left lobe of the liver measuring approximately 3.7 cm in greatest diameter. Electronically Signed   By: Aletta Edouard M.D.   On: 05/10/2019 12:39   DG C-Arm 1-60 Min-No Report  Result Date: 04/28/2019 Fluoroscopy was utilized by the requesting physician.  No  radiographic interpretation.   MR ABDOMEN MRCP W WO CONTAST  Result Date: 04/26/2019 CLINICAL DATA:  Liver lesion, cholelithiasis, history of renal cell carcinoma and right nephrectomy EXAM: MRI ABDOMEN WITHOUT AND WITH CONTRAST (INCLUDING MRCP) TECHNIQUE: Multiplanar multisequence MR imaging of the abdomen was performed both before and after the administration of intravenous contrast. Heavily T2-weighted images of the biliary and pancreatic ducts were obtained, and three-dimensional MRCP images were rendered by post processing. CONTRAST:  7.11m GADAVIST GADOBUTROL 1 MMOL/ML IV SOLN COMPARISON:  Abdominal ultrasound, 04/26/2019, CT abdomen pelvis, 06/30/2014, MR abdomen pelvis, 10/31/2013 FINDINGS: Lower chest: No acute findings. Hepatobiliary: There are T2 hyperintense, intrinsically T1 hypointense lesions of the subcapsular anterior right lobe of the liver adjacent to the gallbladder fossa, hepatic segment V, measuring 2.4 x 2.0 cm and of the posterior left lobe of the liver, segment III, measuring 3.7 x 2.4 cm. These lesions demonstrate central hypoenhancement with progressive rim enhancement on multiphasic contrast enhanced imaging. There are additional small, fluid signal nonenhancing cysts of the liver. Extrahepatic biliary ductal dilatation, with enlargement of the common bile duct up to 14 mm. Pancreas: There is a T2 hyperintense, slightly T1 hypointense, hypoenhancing mass of the pancreatic head measuring approximately 3.6 x 3.1 cm (series 21, image 54). This lesion is situated very laterally and near the ampulla, and does not closely abut the portal vein, superior mesenteric vein, celiac axis, superior mesenteric artery. Spleen:  Within normal limits in size and appearance. Adrenals/Urinary Tract: Status post right nephrectomy. No masses identified. No evidence of hydronephrosis. Stomach/Bowel: Visualized portions within the abdomen are unremarkable. Vascular/Lymphatic: No pathologically enlarged lymph  nodes identified. No abdominal aortic aneurysm demonstrated. Other:  None. Musculoskeletal: No suspicious bone lesions identified. IMPRESSION: 1. Hypoenhancing mass of the pancreatic head measuring approximately 3.6 cm, obstructing the common bile duct near the ampulla, consistent with pancreatic adenocarcinoma.  The pancreatic duct is nondilated. 2. 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 3.  No significant lymphadenopathy. 4. Status post right nephrectomy. No evidence of malignant recurrence at the nephrectomy site. 5.  Cholelithiasis. Electronically Signed   By: Eddie Candle M.D.   On: 04/26/2019 16:56   US Abdomen Limited RUQ  Result Date: 04/26/2019 CLINICAL DATA:  Liver disease. History of a right nephrectomy for renal cell carcinoma. EXAM: ULTRASOUND ABDOMEN LIMITED RIGHT UPPER QUADRANT COMPARISON:  CT, 06/30/2014.  MRI, 10/31/2013. FINDINGS: Gallbladder: Distended. Some dependent sludge. 1.2 cm apparent nonshadowing gallstone. Several echogenic foci along the wall consistent with small polyps. The apparent stone could potentially reflect a polyp. No wall thickening. No pericholecystic fluid. Common bile duct: Diameter: 13 mm.  No visualized duct stone or sludge. Liver: Increased liver parenchymal echogenicity. Subtle nodular contour. There is a hypoechoic mass with a nearly anechoic partial rim, along the posterior left lobe, measuring 3.6 x 2.4 x 3.4 cm, not visualized on the prior MRI or CT. No other liver masses or lesions. Portal vein is patent on color Doppler imaging with normal direction of blood flow towards the liver. Other: None. IMPRESSION: 1. 3.6 cm hypoechoic mass along the posterior margin of the left liver lobe, not visualized on the prior studies. Recommend follow-up liver MRI without and with contrast for further assessment. 2. Gallbladder sludge, probable small polyps and a 1.2 cm nonshadowing stone versus a larger polyp. No evidence of acute cholecystitis. 3.  Dilated common bile duct to 1.3 cm, increased in size compared to the prior studies. No visualized duct stone. Recommend follow-up MRCP to accompany the liver MRI, for further assessment. Electronically Signed   By: Lajean Manes M.D.   On: 04/26/2019 12:19   Assessment and plan- Patient is a 64 y.o. male who presents to [facility] for initial meeting in preparation for starting chemotherapy for the treatment of stage IV pancreatic cancer.   1. HPI: Mr. Hartig is a 64 year old male who was recently diagnosed with pancreatic cancer.  He has a remote history of renal cell carcinoma status post right nephrectomy in November 2020.  He was admitted from 04/26/2019 through 04/29/2019 due to painless jaundice generalized weakness.  He was found to have an elevated bilirubin and a mass in the left liver lobe.  MRCP shows mass in the pancreatic head spleen consistent with pancreatic adenocarcinoma.  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  A biliary sphenoidectomy was performed biliary stent was placed in the common bile duct.  Plan is for palliative modified FOLFIRINOX and he is scheduled to begin tomorrow.  2. Chemo Care Clinic/High Risk for ER/Hospitalization during chemotherapy- We discussed the role of the chemo care clinic and identified patient specific risk factors. I discussed that patient was identified as high risk primarily based on: Stage of disease  General Risk Score: 5  Values used to calculate this score:   Points  Metrics      0        Age: [place] [facility] Admissions: 3      1        ED Visits: 1      0        Has Chronic Obstructive Pulmonary Disease: No      0  Has Diabetes: No      0        Has Congestive Heart Failure: No      1        Has liver disease: Yes      0        Has Depression: No      0        Current PCP: Olin Hauser, DO      0        Has Medicaid:  No    Patient has past medical history positive for: Past Medical History:  Diagnosis Date  . Alcohol abuse   . Arthritis   . Blood clotting disorder ([place])   . Chronic renal disease   . H/O urinary retention   . Hematuria, gross   . Hypertension   . Renal cell carcinoma ([place]) 11/2013   kidney cancer s/p R nephrectomy  . Right renal mass     Patient has past surgical history positive for: Past Surgical History:  Procedure Laterality Date  . ERCP N/A 04/28/2019   Procedure: ENDOSCOPIC RETROGRADE CHOLANGIOPANCREATOGRAPHY (ERCP);  Surgeon: Lucilla Lame, MD;  Location: [facility] ENDOSCOPY;  Service: Endoscopy;  Laterality: N/A;  . kidney removed Right   . liver stent Right   . NEPHRECTOMY RADICAL Right 11/2013   R side kidney cancer surgery, non adrenal sparing  . PORTA CATH INSERTION N/A 05/20/2019   Procedure: PORTA CATH INSERTION;  Surgeon: Algernon Huxley, MD;  Location: [place] CV LAB;  Service: Cardiovascular;  Laterality: N/A;    Based on our high risk symptom management report; this patient has a high risk of ED utilization.  The percentage below indicates how "at risk "  this patient based on the factors in this table within one year.   General Risk Score: 5  Values used to calculate this score:   Points  Metrics      0        Age: 35      [facility] Admissions: 3      1        ED Visits: 1      0        Has Chronic Obstructive Pulmonary Disease: No      0        Has Diabetes: No      0        Has Congestive Heart Failure: No      1        Has liver disease: Yes      0        Has Depression: No      0        Current PCP: Olin Hauser, DO      0        Has Medicaid: No    3. We discussed that social determinants of health may have significant impacts on health and outcomes for cancer patients.  Today we discussed specific social determinants of performance status, alcohol use, depression, financial needs, food insecurity, housing, interpersonal  violence, social connections, stress, tobacco use, and transportation.    After lengthy discussion the following were identified as areas of need: None at this time.     Outpatient services: We discussed options including home based and outpatient services, DME and care program. We discusssed that patients who participate in regular physical activity report fewer negative impacts of cancer and treatments and  report less fatigue.   Financial Concerns: We discussed that living with cancer can create tremendous financial burden.  We discussed options for assistance. I asked that if assistance is needed in affording medications or paying bills to please let us know so that we can provide assistance. We discussed options for food including social services, [employer] ($50 every 2 weeks) and onsite food pantry.  We will also notify [facility] [place] to see if cancer center can provide additional support.  Referral to Social work: Introduced Education officer, museum Elease Etienne and the services he can provide such as support with [employer], cell phone and gas vouchers.   Support groups: We discussed options for support groups at the cancer center. If interested, please notify nurse navigator to enroll. We discussed options for managing stress including healthy eating, exercise as well as participating in no charge counseling services at the cancer center and support groups.  If these are of interest, patient can notify either myself or primary nursing team.We discussed options for management including medications and referral to quit Smart program  Transportation: We discussed options for transportation including acta, paratransit, bus routes, link transit, taxi/uber/lyft, and cancer center [place].  I have notified primary oncology team who will help assist with arranging Lucianne Lei transportation for appointments when/if needed. We also discussed options for transportation on short notice/acute visits.  Palliative care  services: We have palliative care services available in the cancer center to discuss goals of care and advanced care planning.  Please let us know if you have any questions or would like to speak to our palliative nurse practitioner.  Symptom Management Clinic: We discussed our symptom management clinic which is available for acute concerns while receiving treatment such as nausea, vomiting or diarrhea.  We can be reached via telephone at [phone] or through my chart.  We are available for virtual or in person visits on the same day from 830 to 4 PM Monday through Friday. He denies needing specific assistance at this time and He will be followed by Dr. Tasia Catchings clinical team.  Plan: Discussed symptom management clinic. Discussed palliative care services. Discussed resources that are available here at the cancer center. Discussed medications and new prescriptions to begin treatment such as anti-nausea or steroids.   Disposition: RTC on 05/24/2019 for MD assessment, lab work and initiation of cycle 1 FOLFIRINOX. Return to clinic on 05/24/2019 for palliative care consult.  Visit Diagnosis No diagnosis found.  Patient expressed understanding and was in agreement with this plan. He also understands that He can call clinic at any time with any questions, concerns, or complaints.   Greater than 50% was spent in counseling and coordination of care with this patient including but not limited to discussion of the relevant topics above (See A&P) including, but not limited to diagnosis and management of acute and chronic medical conditions.   [place] [facility] [place]  CC: Dr. Tasia Catchings

## 2019-05-24 ENCOUNTER — Inpatient Hospital Stay: Payer: BC Managed Care – PPO

## 2019-05-24 ENCOUNTER — Encounter: Payer: Self-pay | Admitting: Oncology

## 2019-05-24 ENCOUNTER — Inpatient Hospital Stay (HOSPITAL_BASED_OUTPATIENT_CLINIC_OR_DEPARTMENT_OTHER): Payer: BC Managed Care – PPO | Admitting: Hospice and Palliative Medicine

## 2019-05-24 ENCOUNTER — Inpatient Hospital Stay (HOSPITAL_BASED_OUTPATIENT_CLINIC_OR_DEPARTMENT_OTHER): Payer: BC Managed Care – PPO | Admitting: Oncology

## 2019-05-24 VITALS — BP 101/79 | HR 80 | Temp 98.3°F | Resp 18 | Wt 174.9 lb

## 2019-05-24 DIAGNOSIS — E871 Hypo-osmolality and hyponatremia: Secondary | ICD-10-CM | POA: Diagnosis not present

## 2019-05-24 DIAGNOSIS — F419 Anxiety disorder, unspecified: Secondary | ICD-10-CM | POA: Diagnosis not present

## 2019-05-24 DIAGNOSIS — Z5189 Encounter for other specified aftercare: Secondary | ICD-10-CM | POA: Diagnosis not present

## 2019-05-24 DIAGNOSIS — I10 Essential (primary) hypertension: Secondary | ICD-10-CM | POA: Diagnosis not present

## 2019-05-24 DIAGNOSIS — Z85528 Personal history of other malignant neoplasm of kidney: Secondary | ICD-10-CM | POA: Diagnosis not present

## 2019-05-24 DIAGNOSIS — K831 Obstruction of bile duct: Secondary | ICD-10-CM

## 2019-05-24 DIAGNOSIS — T402X5A Adverse effect of other opioids, initial encounter: Secondary | ICD-10-CM | POA: Diagnosis not present

## 2019-05-24 DIAGNOSIS — Z515 Encounter for palliative care: Secondary | ICD-10-CM | POA: Diagnosis not present

## 2019-05-24 DIAGNOSIS — C259 Malignant neoplasm of pancreas, unspecified: Secondary | ICD-10-CM

## 2019-05-24 DIAGNOSIS — Z905 Acquired absence of kidney: Secondary | ICD-10-CM | POA: Diagnosis not present

## 2019-05-24 DIAGNOSIS — K5903 Drug induced constipation: Secondary | ICD-10-CM | POA: Diagnosis not present

## 2019-05-24 DIAGNOSIS — C772 Secondary and unspecified malignant neoplasm of intra-abdominal lymph nodes: Secondary | ICD-10-CM | POA: Diagnosis not present

## 2019-05-24 DIAGNOSIS — F17210 Nicotine dependence, cigarettes, uncomplicated: Secondary | ICD-10-CM | POA: Diagnosis not present

## 2019-05-24 DIAGNOSIS — C787 Secondary malignant neoplasm of liver and intrahepatic bile duct: Secondary | ICD-10-CM | POA: Diagnosis not present

## 2019-05-24 DIAGNOSIS — Z7189 Other specified counseling: Secondary | ICD-10-CM | POA: Diagnosis not present

## 2019-05-24 DIAGNOSIS — Z5111 Encounter for antineoplastic chemotherapy: Secondary | ICD-10-CM | POA: Insufficient documentation

## 2019-05-24 DIAGNOSIS — E876 Hypokalemia: Secondary | ICD-10-CM | POA: Diagnosis not present

## 2019-05-24 DIAGNOSIS — N189 Chronic kidney disease, unspecified: Secondary | ICD-10-CM | POA: Diagnosis not present

## 2019-05-24 DIAGNOSIS — G893 Neoplasm related pain (acute) (chronic): Secondary | ICD-10-CM | POA: Diagnosis not present

## 2019-05-24 HISTORY — DX: Encounter for antineoplastic chemotherapy: Z51.11

## 2019-05-24 LAB — CBC WITH DIFFERENTIAL/PLATELET
Abs Immature Granulocytes: 0.08 10*3/uL — ABNORMAL HIGH (ref 0.00–0.07)
Basophils Absolute: 0.1 10*3/uL (ref 0.0–0.1)
Basophils Relative: 1 %
Eosinophils Absolute: 0.3 10*3/uL (ref 0.0–0.5)
Eosinophils Relative: 2 %
HCT: 38.3 % — ABNORMAL LOW (ref 39.0–52.0)
Hemoglobin: 13.4 g/dL (ref 13.0–17.0)
Immature Granulocytes: 1 %
Lymphocytes Relative: 10 %
Lymphs Abs: 1.5 10*3/uL (ref 0.7–4.0)
MCH: 30.8 pg (ref 26.0–34.0)
MCHC: 35 g/dL (ref 30.0–36.0)
MCV: 88 fL (ref 80.0–100.0)
Monocytes Absolute: 1.3 10*3/uL — ABNORMAL HIGH (ref 0.1–1.0)
Monocytes Relative: 8 %
Neutro Abs: 12.3 10*3/uL — ABNORMAL HIGH (ref 1.7–7.7)
Neutrophils Relative %: 78 %
Platelets: 362 10*3/uL (ref 150–400)
RBC: 4.35 MIL/uL (ref 4.22–5.81)
RDW: 14.3 % (ref 11.5–15.5)
WBC: 15.6 10*3/uL — ABNORMAL HIGH (ref 4.0–10.5)
nRBC: 0 % (ref 0.0–0.2)

## 2019-05-24 LAB — COMPREHENSIVE METABOLIC PANEL
ALT: 39 U/L (ref 0–44)
AST: 22 U/L (ref 15–41)
Albumin: 3.6 g/dL (ref 3.5–5.0)
Alkaline Phosphatase: 114 U/L (ref 38–126)
Anion gap: 8 (ref 5–15)
BUN: 15 mg/dL (ref 8–23)
CO2: 24 mmol/L (ref 22–32)
Calcium: 9 mg/dL (ref 8.9–10.3)
Chloride: 99 mmol/L (ref 98–111)
Creatinine, Ser: 1.08 mg/dL (ref 0.61–1.24)
GFR calc Af Amer: >60 mL/min (ref >60)
GFR calc non Af Amer: >60 mL/min (ref >60)
Glucose, Bld: 136 mg/dL — ABNORMAL HIGH (ref 70–99)
Potassium: 3.8 mmol/L (ref 3.5–5.1)
Sodium: 131 mmol/L — ABNORMAL LOW (ref 135–145)
Total Bilirubin: 1.6 mg/dL — ABNORMAL HIGH (ref 0.3–1.2)
Total Protein: 7.1 g/dL (ref 6.5–8.1)

## 2019-05-24 MED ORDER — PALONOSETRON HCL INJECTION 0.25 MG/5ML
0.2500 mg | Freq: Once | INTRAVENOUS | Status: AC
  Administered 2019-05-24: 0.25 mg via INTRAVENOUS
  Filled 2019-05-24: qty 5

## 2019-05-24 MED ORDER — SODIUM CHLORIDE 0.9 % IV SOLN
10.0000 mg | Freq: Once | INTRAVENOUS | Status: AC
  Administered 2019-05-24: 10 mg via INTRAVENOUS
  Filled 2019-05-24: qty 10

## 2019-05-24 MED ORDER — SODIUM CHLORIDE 0.9 % IV SOLN
2400.0000 mg/m2 | INTRAVENOUS | Status: DC
  Administered 2019-05-24: 4850 mg via INTRAVENOUS
  Filled 2019-05-24: qty 97

## 2019-05-24 MED ORDER — SODIUM CHLORIDE 0.9 % IV SOLN
150.0000 mg | Freq: Once | INTRAVENOUS | Status: AC
  Administered 2019-05-24: 150 mg via INTRAVENOUS
  Filled 2019-05-24: qty 150

## 2019-05-24 MED ORDER — DEXAMETHASONE 4 MG PO TABS: 8.0000 mg | ORAL_TABLET | Freq: Every day | ORAL | 5 refills | Status: AC

## 2019-05-24 MED ORDER — SODIUM CHLORIDE 0.9 % IV SOLN
150.0000 mg/m2 | Freq: Once | INTRAVENOUS | Status: AC
  Administered 2019-05-24: 300 mg via INTRAVENOUS
  Filled 2019-05-24: qty 15

## 2019-05-24 MED ORDER — ATROPINE SULFATE 1 MG/ML IJ SOLN
0.5000 mg | Freq: Once | INTRAMUSCULAR | Status: AC | PRN
  Administered 2019-05-24: 0.5 mg via INTRAVENOUS
  Filled 2019-05-24: qty 1

## 2019-05-24 MED ORDER — DEXTROSE 5 % IV SOLN
Freq: Once | INTRAVENOUS | Status: AC
  Filled 2019-05-24: qty 250

## 2019-05-24 MED ORDER — SODIUM CHLORIDE 0.9 % IV SOLN
800.0000 mg | Freq: Once | INTRAVENOUS | Status: AC
  Administered 2019-05-24: 800 mg via INTRAVENOUS
  Filled 2019-05-24: qty 25

## 2019-05-24 MED ORDER — OXALIPLATIN CHEMO INJECTION 100 MG/20ML
85.0000 mg/m2 | Freq: Once | INTRAVENOUS | Status: AC
  Administered 2019-05-24: 170 mg via INTRAVENOUS
  Filled 2019-05-24: qty 34

## 2019-05-24 NOTE — Progress Notes (Signed)
[place]  Telephone:([phone] [phone]   Name: Kenneth Maxwell Date: 05/24/2019 MRN: [id number]  DOB: [birth year]  Patient Care Team: Olin Hauser, DO as PCP - General (Family Medicine) Clent Jacks, RN as Oncology Nurse Navigator    REASON FOR CONSULTATION: Kenneth Maxwell is a 63 y.o. 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  Patient was hospitalized 04/26/2019 to 04/29/2019 with painless jaundice and generalized weakness.  Work-up revealed a large pancreatic mass obstructing the CBD with metastases to the liver.  Patient underwent biliary stenting.  Patient was started on FOLFIRINOX chemotherapy with palliative intent.  He was referred to palliative care to help address goals and manage ongoing symptoms.  SOCIAL HISTORY:     reports that he has been smoking cigarettes. He has a 50.00 pack-year smoking history. He uses smokeless tobacco. He reports previous alcohol use of about 49.0 standard drinks of alcohol per week. He reports that he does not use drugs.   Patient is married and lives at home with his wife. He has 3 children who lives on the same property. Patient worked as a Public affairs consultant.  ADVANCE DIRECTIVES:  Does not have  CODE STATUS:   PAST MEDICAL HISTORY: Past Medical History:  Diagnosis Date  . Alcohol abuse   . Arthritis   . Blood clotting disorder ([place])   . Chronic renal disease   . H/O urinary retention   . Hematuria, gross   . Hypertension   . Renal cell carcinoma ([place]) 11/2013   kidney cancer s/p R nephrectomy  . Right renal mass     PAST SURGICAL HISTORY:  Past Surgical History:  Procedure Laterality Date  . ERCP N/A 04/28/2019   Procedure: ENDOSCOPIC RETROGRADE CHOLANGIOPANCREATOGRAPHY (ERCP);   Surgeon: Lucilla Lame, MD;  Location: [facility] ENDOSCOPY;  Service: Endoscopy;  Laterality: N/A;  . kidney removed Right   . liver stent Right   . NEPHRECTOMY RADICAL Right 11/2013   R side kidney cancer surgery, non adrenal sparing  . PORTA CATH INSERTION N/A 05/20/2019   Procedure: PORTA CATH INSERTION;  Surgeon: Algernon Huxley, MD;  Location: [facility] LAB;  Service: Cardiovascular;  Laterality: N/A;    HEMATOLOGY/ONCOLOGY HISTORY:  Oncology History  Pancreatic adenocarcinoma ([place])  04/26/2019 Initial Diagnosis   Pancreatic adenocarcinoma ([place])   05/24/2019 -  Chemotherapy   The patient had palonosetron (ALOXI) injection 0.25 mg, 0.25 mg, Intravenous,  Once, 1 of 4 cycles pegfilgrastim (NEULASTA ONPRO KIT) injection 6 mg, 6 mg, Subcutaneous, Once, 1 of 4 cycles irinotecan (CAMPTOSAR) 300 mg in sodium chloride 0.9 % 500 mL chemo infusion, 150 mg/m2 = 300 mg, Intravenous,  Once, 1 of 4 cycles oxaliplatin (ELOXATIN) 170 mg in dextrose 5 % 500 mL chemo infusion, 85 mg/m2 = 170 mg, Intravenous,  Once, 1 of 4 cycles fosaprepitant (EMEND) 150 mg in sodium chloride 0.9 % 145 mL IVPB, 150 mg, Intravenous,  Once, 1 of 4 cycles fluorouracil (ADRUCIL) 4,850 mg in sodium chloride 0.9 % 53 mL chemo infusion, 2,400 mg/m2 = 4,850 mg, Intravenous, 1 Day/Dose, 1 of 4 cycles leucovorin 808 mg in sodium chloride 0.9 % 250 mL infusion, 400 mg/m2 = 808 mg, Intravenous,  Once, 1 of 4 cycles  for chemotherapy treatment.      ALLERGIES:  is allergic to codeine.  MEDICATIONS:  Current Outpatient Medications  Medication Sig Dispense Refill  . amLODipine (NORVASC) 10 MG tablet Take 1 tablet (10 mg total) by mouth daily. (Patient not taking: Reported on 05/24/2019) 90 tablet 3  . dexamethasone (DECADRON) 4 MG tablet Take 2 tablets (8 mg total) by mouth daily. Start the day after chemotherapy for 3 days. Take with food. 8 tablet 5  . lidocaine-prilocaine (EMLA) cream Apply to affected area once 30 g 3  .  loperamide (IMODIUM A-D) 2 MG tablet Take 2 at onset of diarrhea, then 1 every 2hrs until 12hr without a BM. May take 2 tab every 4hrs at bedtime. If diarrhea recurs repeat. (Patient not taking: Reported on 05/24/2019) 100 tablet 1  . losartan (COZAAR) 100 MG tablet Take 1 tablet (100 mg total) by mouth daily. 90 tablet 3  . Multiple Vitamin (MULTIVITAMIN WITH MINERALS) TABS tablet Take 1 tablet by mouth daily.    Marland Kitchen oxyCODONE (ROXICODONE) 5 MG immediate release tablet Take 1 tablet (5 mg total) by mouth every 6 (six) hours as needed for severe pain. 30 tablet 0  . prochlorperazine (COMPAZINE) 10 MG tablet Take 1 tablet (10 mg total) by mouth every 6 (six) hours as needed (Nausea or vomiting). (Patient not taking: Reported on 05/24/2019) 30 tablet 1  . spironolactone (ALDACTONE) 25 MG tablet Take 1 tablet (25 mg total) by mouth daily. (Patient not taking: Reported on 05/24/2019) 30 tablet 1   No current facility-administered medications for this visit.   Facility-Administered Medications Ordered in Other Visits  Medication Dose Route Frequency Provider Last Rate Last Admin  . fluorouracil (ADRUCIL) 4,850 mg in sodium chloride 0.9 % 53 mL chemo infusion  2,400 mg/m2 (Treatment Plan Recorded) Intravenous 1 day or 1 dose Earlie Server, MD      . irinotecan (CAMPTOSAR) 300 mg in sodium chloride 0.9 % 500 mL chemo infusion  150 mg/m2 (Treatment Plan Recorded) Intravenous Once Earlie Server, MD      . leucovorin 800 mg in sodium chloride 0.9 % 250 mL infusion  800 mg Intravenous Once Earlie Server, MD      . oxaliplatin (ELOXATIN) 170 mg in dextrose 5 % 500 mL chemo infusion  85 mg/m2 (Treatment Plan Recorded) Intravenous Once Earlie Server, MD        VITAL SIGNS: There were no vitals taken for this visit. There were no vitals filed for this visit.  Estimated body mass index is 24.39 kg/m as calculated from the following:   Height as of 05/20/19: _0  (1.803 m).   Weight as of an earlier encounter on 05/24/19: 174 lb 14.4  oz (79.3 kg).  LABS: CBC:    Component Value Date/Time   WBC 15.6 (H) 05/24/2019 0812   HGB 13.4 05/24/2019 0812   HGB 13.5 11/13/2013 0422   HCT 38.3 (L) 05/24/2019 0812   HCT 40.5 11/13/2013 0422   PLT 362 05/24/2019 0812   PLT 185 11/13/2013 0422   MCV 88.0 05/24/2019 0812   MCV 93 11/13/2013 0422   NEUTROABS 12.3 (H) 05/24/2019 0812   NEUTROABS 8.4 (H) 11/13/2013 0422   LYMPHSABS 1.5 05/24/2019 0812   LYMPHSABS 0.9 (L) 11/13/2013 0422   MONOABS 1.3 (H) 05/24/2019 0812   MONOABS 1.0 11/13/2013 0422   EOSABS 0.3 05/24/2019 0812   EOSABS 0.0 11/13/2013 0422   BASOSABS 0.1 05/24/2019 0812   BASOSABS 0.0 11/13/2013 0422   Comprehensive Metabolic Panel:    Component Value Date/Time   NA 131 (L) 05/24/2019 0812   NA  140 11/13/2013 0422   K 3.8 05/24/2019 0812   K 3.6 11/13/2013 0422   CL 99 05/24/2019 0812   CL 104 11/13/2013 0422   CO2 24 05/24/2019 0812   CO2 30 11/13/2013 0422   BUN 15 05/24/2019 0812   BUN 9 11/13/2013 0422   CREATININE 1.08 05/24/2019 0812   CREATININE 1.38 (H) 04/25/2019 0920   GLUCOSE 136 (H) 05/24/2019 0812   GLUCOSE 90 11/13/2013 0422   CALCIUM 9.0 05/24/2019 0812   CALCIUM 7.9 (L) 11/13/2013 0422   AST 22 05/24/2019 0812   AST 28 11/11/2013 1403   ALT 39 05/24/2019 0812   ALT 35 11/11/2013 1403   ALKPHOS 114 05/24/2019 0812   ALKPHOS 71 11/11/2013 1403   BILITOT 1.6 (H) 05/24/2019 0812   BILITOT 0.5 11/11/2013 1403   PROT 7.1 05/24/2019 0812   PROT 6.0 (L) 11/11/2013 1403   ALBUMIN 3.6 05/24/2019 0812   ALBUMIN 3.2 (L) 11/11/2013 1403    RADIOGRAPHIC STUDIES: DG Eye Foreign Body  Result Date: 04/26/2019 CLINICAL DATA:  Screening orbits for MRI EXAM: ORBITS FOR FOREIGN BODY - 2 VIEW COMPARISON:  10/31/2013 FINDINGS: Two frontal views of the orbits demonstrate no radiopaque foreign bodies. No bony abnormalities. Sinuses are clear. IMPRESSION: 1. No radiopaque foreign bodies. Electronically Signed   By: Randa Ngo M.D.   On:  04/26/2019 15:18   CT CHEST W CONTRAST  Result Date: 04/29/2019 CLINICAL DATA:  Cancer of unknown primary. EXAM: CT CHEST WITH CONTRAST TECHNIQUE: Multidetector CT imaging of the chest was performed during intravenous contrast administration. CONTRAST:  41m OMNIPAQUE IOHEXOL 300 MG/ML  SOLN COMPARISON:  April 26, 2019. November 11, 2013. FINDINGS: Cardiovascular: Atherosclerosis of thoracic aorta is noted without aneurysm or dissection. Normal cardiac size. No pericardial effusion. Mild coronary calcifications are noted. Mediastinum/Nodes: No enlarged mediastinal, hilar, or axillary lymph nodes. Thyroid gland, trachea, and esophagus demonstrate no significant findings. Lungs/Pleura: No pneumothorax or pleural effusion is noted. Mild emphysematous disease is noted in the upper lobes. 5 mm nodular density is seen in the right lower lobe just above the diaphragm best seen on image number 111 of series 3. Upper Abdomen: Liver mass and pancreatic mass are again noted as described on recent MRI. Musculoskeletal: No chest wall abnormality. No acute or significant osseous findings. IMPRESSION: 1. 5 mm nodular density is noted in right lower lobe just above the diaphragm. Given the recent diagnosis of pancreatic and hepatic masses, metastatic disease cannot be excluded and follow-up chest CT in 3 months is recommended. 2. Mild coronary calcifications are noted. 3. Liver mass and pancreatic mass are again noted as described on recent MRI. Aortic Atherosclerosis (ICD10-I70.0) and Emphysema (ICD10-J43.9). Electronically Signed   By: JMarijo ConceptionM.D.   On: 04/29/2019 13:31   PERIPHERAL VASCULAR CATHETERIZATION  Result Date: 05/20/2019 See op note  NM PET Image Initial (PI) Skull Base To Thigh  Result Date: 05/07/2019 CLINICAL DATA:  Initial treatment strategy for pancreatic cancer. History of renal cell carcinoma in 2016 with nephrectomy. EXAM: NUCLEAR MEDICINE PET SKULL BASE TO THIGH TECHNIQUE: 9.4 mCi F-18 FDG  was injected intravenously. Full-ring PET imaging was performed from the skull base to thigh after the radiotracer. CT data was obtained and used for attenuation correction and anatomic localization. Fasting blood glucose: 103 mg/dl COMPARISON:  04/29/2019 chest CT.  04/26/2019 abdominal MRI. FINDINGS: Mediastinal blood pool activity: SUV max 2.4 Liver activity: SUV max NA NECK: No areas of abnormal hypermetabolism. Incidental CT findings: Bilateral carotid  atherosclerosis. No cervical adenopathy. CHEST: No pulmonary parenchymal or thoracic nodal hypermetabolism. Incidental CT findings: Deferred to recent diagnostic CT. Aortic and coronary artery atherosclerosis. Centrilobular and paraseptal emphysema. ABDOMEN/PELVIS: Segment 2 lesion measures 2.9 cm and a S.U.V. max of 9.4 on 151/2. Segment 5 lesion measures 2.0 cm and a S.U.V. max of 4.8 on 179/3. Pancreatic head mass measures on the order of 3.5 cm and a S.U.V. max of 4.8 on 178/3. A node adjacent the SMA measures 8 mm and a S.U.V. max of 4.0 on 181/3. Gastric cardia underdistention and hypermetabolism, including at a S.U.V. max of 7.3 on 136/3. Incidental CT findings: Left adrenal thickening is low-density, suggesting hyperplasia are adenoma. Left renal vascular calcification. Right nephrectomy without local recurrence. Common duct stent in place. Small gallstone. Abdominal aortic atherosclerosis. SKELETON: No abnormal marrow activity. Incidental CT findings: Degenerative changes of both hips. IMPRESSION: 1. Hypermetabolic pancreatic primary with bilateral hepatic and peripancreatic nodal metastasis. 2. No evidence of hypermetabolic extraabdominal metastatic disease. 3. Right nephrectomy, without locally recurrent disease. 4. Proximal gastric hypermetabolism could be physiologic or represent gastritis. 5. Incidental findings, including: Aortic atherosclerosis (ICD10-I70.0), coronary artery atherosclerosis and emphysema (ICD10-J43.9). Cholelithiasis.  Electronically Signed   By: Abigail Miyamoto M.D.   On: 05/07/2019 13:33   US BIOPSY (LIVER)  Result Date: 05/10/2019 INDICATION: Mass of the head of the pancreas with 2 lesions in the liver suspicious for metastatic disease. EXAM: ULTRASOUND GUIDED CORE BIOPSY OF LIVER MEDICATIONS: None. ANESTHESIA/SEDATION: Fentanyl 50 mcg IV; Versed 1.0 mg IV Moderate Sedation Time:  16 minutes. The patient was continuously monitored during the procedure by the interventional radiology nurse under my direct supervision. PROCEDURE: The procedure, risks, benefits, and alternatives were explained to the patient. Questions regarding the procedure were encouraged and answered. The patient understands and consents to the procedure. A time-out was performed prior to initiating the procedure. Ultrasound was utilized to localize a lesion within the left lobe of the liver. The anterior abdominal wall was prepped with chlorhexidine in a sterile fashion, and a sterile drape was applied covering the operative field. A sterile gown and sterile gloves were used for the procedure. Local anesthesia was provided with 1% Lidocaine. Under ultrasound guidance, a 31 gauge trocar needle was advanced to the level of a lesion within the posterior aspect of the left lobe of the liver. After confirming needle tip position, 3 separate coaxial 18 gauge core biopsy samples were obtained. Samples were submitted in formalin. A slurry of Gel-Foam pledgets mixed in sterile saline was then injected through the outer needle as the needle was retracted and removed. Additional ultrasound was performed. COMPLICATIONS: None immediate. FINDINGS: Well-circumscribed hypoechoic mass within the posterior aspect of the left lobe of the liver measures approximately 3.7 cm in greatest diameter. Solid tissue was obtained. IMPRESSION: Ultrasound-guided core biopsy performed of a lesion within the left lobe of the liver measuring approximately 3.7 cm in greatest diameter.  Electronically Signed   By: Aletta Edouard M.D.   On: 05/10/2019 12:39   DG C-Arm 1-60 Min-No Report  Result Date: 04/28/2019 Fluoroscopy was utilized by the requesting physician.  No radiographic interpretation.   MR ABDOMEN MRCP W WO CONTAST  Result Date: 04/26/2019 CLINICAL DATA:  Liver lesion, cholelithiasis, history of renal cell carcinoma and right nephrectomy EXAM: MRI ABDOMEN WITHOUT AND WITH CONTRAST (INCLUDING MRCP) TECHNIQUE: Multiplanar multisequence MR imaging of the abdomen was performed both before and after the administration of intravenous contrast. Heavily T2-weighted images of the biliary and pancreatic ducts  were obtained, and three-dimensional MRCP images were rendered by post processing. CONTRAST:  7.67m GADAVIST GADOBUTROL 1 MMOL/ML IV SOLN COMPARISON:  Abdominal ultrasound, 04/26/2019, CT abdomen pelvis, 06/30/2014, MR abdomen pelvis, 10/31/2013 FINDINGS: Lower chest: No acute findings. Hepatobiliary: There are T2 hyperintense, intrinsically T1 hypointense lesions of the subcapsular anterior right lobe of the liver adjacent to the gallbladder fossa, hepatic segment V, measuring 2.4 x 2.0 cm and of the posterior left lobe of the liver, segment III, measuring 3.7 x 2.4 cm. These lesions demonstrate central hypoenhancement with progressive rim enhancement on multiphasic contrast enhanced imaging. There are additional small, fluid signal nonenhancing cysts of the liver. Extrahepatic biliary ductal dilatation, with enlargement of the common bile duct up to 14 mm. Pancreas: There is a T2 hyperintense, slightly T1 hypointense, hypoenhancing mass of the pancreatic head measuring approximately 3.6 x 3.1 cm (series 21, image 54). This lesion is situated very laterally and near the ampulla, and does not closely abut the portal vein, superior mesenteric vein, celiac axis, superior mesenteric artery. Spleen:  Within normal limits in size and appearance. Adrenals/Urinary Tract: Status post right  nephrectomy. No masses identified. No evidence of hydronephrosis. Stomach/Bowel: Visualized portions within the abdomen are unremarkable. Vascular/Lymphatic: No pathologically enlarged lymph nodes identified. No abdominal aortic aneurysm demonstrated. Other:  None. Musculoskeletal: No suspicious bone lesions identified. IMPRESSION: 1. Hypoenhancing mass of the pancreatic head measuring approximately 3.6 cm, obstructing the common bile duct near the ampulla, consistent with pancreatic adenocarcinoma. The pancreatic duct is nondilated. 2. 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 3.  No significant lymphadenopathy. 4. Status post right nephrectomy. No evidence of malignant recurrence at the nephrectomy site. 5.  Cholelithiasis. Electronically Signed   By: AEddie CandleM.D.   On: 04/26/2019 16:56   UKoreaAbdomen Limited RUQ  Result Date: 04/26/2019 CLINICAL DATA:  Liver disease. History of a right nephrectomy for renal cell carcinoma. EXAM: ULTRASOUND ABDOMEN LIMITED RIGHT UPPER QUADRANT COMPARISON:  CT, 06/30/2014.  MRI, 10/31/2013. FINDINGS: Gallbladder: Distended. Some dependent sludge. 1.2 cm apparent nonshadowing gallstone. Several echogenic foci along the wall consistent with small polyps. The apparent stone could potentially reflect a polyp. No wall thickening. No pericholecystic fluid. Common bile duct: Diameter: 13 mm.  No visualized duct stone or sludge. Liver: Increased liver parenchymal echogenicity. Subtle nodular contour. There is a hypoechoic mass with a nearly anechoic partial rim, along the posterior left lobe, measuring 3.6 x 2.4 x 3.4 cm, not visualized on the prior MRI or CT. No other liver masses or lesions. Portal vein is patent on color Doppler imaging with normal direction of blood flow towards the liver. Other: None. IMPRESSION: 1. 3.6 cm hypoechoic mass along the posterior margin of the left liver lobe, not visualized on the prior studies. Recommend follow-up liver  MRI without and with contrast for further assessment. 2. Gallbladder sludge, probable small polyps and a 1.2 cm nonshadowing stone versus a larger polyp. No evidence of acute cholecystitis. 3. Dilated common bile duct to 1.3 cm, increased in size compared to the prior studies. No visualized duct stone. Recommend follow-up MRCP to accompany the liver MRI, for further assessment. Electronically Signed   By: DLajean ManesM.D.   On: 04/26/2019 12:19    PERFORMANCE STATUS (ECOG) : 1 - Symptomatic but completely ambulatory  Review of Systems Unless otherwise noted, a complete review of systems is negative.  Physical Exam General: NAD Pulmonary: Unlabored Extremities: no edema, no joint deformities Skin: no rashes Neurological: Nonfocal  IMPRESSION: I met with patient in the infusion area. I introduced palliative care services and attempted to establish therapeutic rapport.  Patient reports he is doing reasonably well. He denies any significant changes or concerns today. Symptomatically, he has occasional pain, which is relieved with use of as needed oxycodone. He says that he is trying to take the oxycodone sparingly due to opioid-induced constipation. He is currently constipated and says he is having a bowel movement every few days. He is currently taking MiraLAX and stool softeners. I suggested that he try OTC Senokot.  Patient seems to understand the significance of his diagnosis and treatment plan. He verbalized at times feeling down and depressed. He has had increased fatigue recently but says that he is sleeping well at night. Could consider trial of an antidepressant.  We discussed ACP documents and the MOST form, both of which patient took home to discuss with his family.  PLAN: -Continue current scope of treatment -ACP/MOST form reviewed -RTC 2-3 weeks   Patient expressed understanding and was in agreement with this plan. He also understands that He can call the clinic at any time  with any questions, concerns, or complaints.     Time Total: 15 minutes  Visit consisted of counseling and education dealing with the complex and emotionally intense issues of symptom management and palliative care in the setting of serious and potentially life-threatening illness.Greater than 50%  of this time was spent counseling and coordinating care related to the above assessment and plan.  Signed by: Altha Harm, PhD, NP-C

## 2019-05-24 NOTE — Progress Notes (Addendum)
Hematology/Oncology Follow Up Note [facility]  [phone] [phone]  Patient Care Team: Olin Hauser, DO as PCP - General (Family Medicine) Clent Jacks, RN as Oncology Nurse Navigator   Name of the patient: Kenneth Maxwell  [id number]  1955-01-22   REASON FOR VISIT  follow-up for pancreatic cancer  PERTINENT ONCOLOGY HISTORY Kenneth Maxwell is a 64 y.o.[place] who has above oncology history reviewed by me today presented for follow up visit for management of metastatic pancreatic cancer 64 y.o. male presents for management of pancreatic cancer. Patient has a remote history of RCC status post right nephrectomy in 11/23/2018. Anxiety, alcohol abuse, tobacco abuse. Patient was admitted from 04/26/2019-04/29/2019 due to painless jaundice, generalized weakness. Bilirubin was elevated to 10, right upper quadrant ultrasound showed 3.6 cm hypoechoic mass along the posterior margin of the left liver lobe.  Gallbladder sludge.  Dilated CBD. MRCP showed hypoenhancing mass of the pancreatic head measuring about 3.6 cm, obstructing CBD near the ampulla consistent with pancreatic adenocarcinoma.  Hypoenhancing metastasis of liver and cholelithiasis. Patient was seen by gastroenterology.  Had a ERCP on 04/28/2019 which revealed a duodenal mass, single localized biliary stricture in the lower third of the main bile duct and dilated entire main bile duct.  A biliary sphincterotomy was performed.  Biliary stent was placed into the common bile duct.  Biopsy was performed. Unfortunately biopsy was not diagnostic.  Patient's bilirubin trended down after procedure.  05/10/2019 he underwent ultrasound-guided left liver mass biopsy and the pathology showed metastatic adenocarcinoma, consistent with pancreati biliary origin.  Today patient was accompanied by his wife.  Patient has called earlier this week reporting abdominal pain which was not improved by  taking Tylenol or NSAIDs.  Patient was prescribed with oxycodone 5 mg every 6 hours as needed.  He reports pain is better controlled. He is constipated.  Last bowel movement 3 days ago.  # INTERVAL HISTORY 64 y.o. male presents for management of stage IV pancreatic cancer Patient has had chemotherapy class, Mediport placement.  He has also picked up his antiemetics, EMLA cream prescriptions. Patient continues to have constipation.  He uses Colace daily and MiraLAX as needed.  Sometimes he has lower abdomen pain. He also has epigastric pain radiating to the back, he uses oxycodone 5 mg every 6 hours as needed. He uses 1-2 oxycodone /day     Review of Systems  Constitutional: Negative for appetite change, chills, fatigue, fever and unexpected weight change.  HENT:   Negative for hearing loss and voice change.   Eyes: Negative for eye problems and icterus.  Respiratory: Negative for chest tightness, cough and shortness of breath.   Cardiovascular: Negative for chest pain and leg swelling.  Gastrointestinal: Positive for abdominal pain and constipation. Negative for abdominal distention.  Endocrine: Negative for hot flashes.  Genitourinary: Negative for difficulty urinating, dysuria and frequency.   Musculoskeletal: Negative for arthralgias.  Skin: Negative for itching and rash.  Neurological: Negative for light-headedness and numbness.  Hematological: Negative for adenopathy. Does not bruise/bleed easily.  Psychiatric/Behavioral: Negative for confusion.      Allergies  Allergen Reactions  . Codeine Rash and Other (See Comments)     Past Medical History:  Diagnosis Date  . Alcohol abuse   . Arthritis   . Blood clotting disorder ([place])   . Chronic renal disease   . H/O urinary retention   . Hematuria, gross   . Hypertension   . Renal cell carcinoma ([place])  11/2013   kidney cancer s/p R nephrectomy  . Right renal mass      Past Surgical History:  Procedure Laterality Date    . ERCP N/A 04/28/2019   Procedure: ENDOSCOPIC RETROGRADE CHOLANGIOPANCREATOGRAPHY (ERCP);  Surgeon: Lucilla Lame, MD;  Location: [facility] ENDOSCOPY;  Service: Endoscopy;  Laterality: N/A;  . kidney removed Right   . liver stent Right   . NEPHRECTOMY RADICAL Right 11/2013   R side kidney cancer surgery, non adrenal sparing  . PORTA CATH INSERTION N/A 05/20/2019   Procedure: PORTA CATH INSERTION;  Surgeon: Algernon Huxley, MD;  Location: [place] CV LAB;  Service: Cardiovascular;  Laterality: N/A;    Social History   Socioeconomic History  . Marital status: Married    Spouse name: Not on file  . Number of children: Not on file  . Years of education: Not on file  . Highest education level: Not on file  Occupational History  . Not on file  Tobacco Use  . Smoking status: Current Every Day Smoker    Packs/day: 1.00    Years: 50.00    Pack years: 50.00    Types: Cigarettes  . Smokeless tobacco: Current User  Substance and Sexual Activity  . Alcohol use: Not Currently    Alcohol/week: 49.0 standard drinks    Types: 49 Cans of beer per week    Comment: hasn't drank in over 1 month  . Drug use: No  . Sexual activity: Yes  Other Topics Concern  . Not on file  Social History Narrative  . Not on file   Social Determinants of Health   Financial Resource Strain:   . Difficulty of Paying Living Expenses:   Food Insecurity:   . Worried About Charity fundraiser in the Last Year:   . Arboriculturist in the Last Year:   Transportation Needs:   . Film/video editor (Medical):   Marland Kitchen Lack of Transportation (Non-Medical):   Physical Activity:   . Days of Exercise per Week:   . Minutes of Exercise per Session:   Stress:   . Feeling of Stress :   Social Connections:   . Frequency of Communication with Friends and Family:   . Frequency of Social Gatherings with Friends and Family:   . Attends Religious Services:   . Active Member of Clubs or Organizations:   . Attends Theatre manager Meetings:   Marland Kitchen Marital Status:   Intimate Partner Violence:   . Fear of Current or Ex-Partner:   . Emotionally Abused:   Marland Kitchen Physically Abused:   . Sexually Abused:     Family History  Problem Relation Age of Onset  . Hypertension Mother   . Diabetes Mother   . Hypertension Father   . Heart attack Father      Current Outpatient Medications:  .  amLODipine (NORVASC) 10 MG tablet, Take 1 tablet (10 mg total) by mouth daily., Disp: 90 tablet, Rfl: 3 .  dexamethasone (DECADRON) 4 MG tablet, Take 2 tablets (8 mg total) by mouth daily. Start the day after chemotherapy for 3 days. Take with food. (Patient not taking: Reported on 05/20/2019), Disp: 8 tablet, Rfl: 5 .  lidocaine-prilocaine (EMLA) cream, Apply to affected area once, Disp: 30 g, Rfl: 3 .  loperamide (IMODIUM A-D) 2 MG tablet, Take 2 at onset of diarrhea, then 1 every 2hrs until 12hr without a BM. May take 2 tab every 4hrs at bedtime. If diarrhea recurs repeat., Disp: 100  tablet, Rfl: 1 .  losartan (COZAAR) 100 MG tablet, Take 1 tablet (100 mg total) by mouth daily., Disp: 90 tablet, Rfl: 3 .  Multiple Vitamin (MULTIVITAMIN WITH MINERALS) TABS tablet, Take 1 tablet by mouth daily., Disp:  , Rfl:  .  oxyCODONE (ROXICODONE) 5 MG immediate release tablet, Take 1 tablet (5 mg total) by mouth every 6 (six) hours as needed for severe pain., Disp: 30 tablet, Rfl: 0 .  prochlorperazine (COMPAZINE) 10 MG tablet, Take 1 tablet (10 mg total) by mouth every 6 (six) hours as needed (Nausea or vomiting)., Disp: 30 tablet, Rfl: 1 .  spironolactone (ALDACTONE) 25 MG tablet, Take 1 tablet (25 mg total) by mouth daily., Disp: 30 tablet, Rfl: 1  Physical exam:  Vitals:   05/24/19 0849  BP: 101/79  Pulse: 80  Resp: 18  Temp: 98.3 F (36.8 C)  TempSrc: Tympanic  SpO2: 100%  Weight: 174 lb 14.4 oz (79.3 kg)   Physical Exam Constitutional:      General: He is not in acute distress. HENT:     Head: Normocephalic and atraumatic.    Eyes:     General: No scleral icterus. Cardiovascular:     Rate and Rhythm: Normal rate and regular rhythm.     Heart sounds: Normal heart sounds.  Pulmonary:     Effort: Pulmonary effort is normal. No respiratory distress.     Breath sounds: No wheezing.  Abdominal:     General: Bowel sounds are normal. There is no distension.     Palpations: Abdomen is soft.  Musculoskeletal:        General: No deformity. Normal range of motion.     Cervical back: Normal range of motion and neck supple.  Skin:    General: Skin is warm and dry.     Coloration: Skin is jaundiced.     Findings: No erythema or rash.  Neurological:     Mental Status: He is alert and oriented to person, place, and time. Mental status is at baseline.     Cranial Nerves: No cranial nerve deficit.     Coordination: Coordination normal.  Psychiatric:        Mood and Affect: Mood normal.     CMP Latest Ref Rng & Units 05/17/2019  Glucose 70 - 99 mg/dL 86  BUN 8 - 23 mg/dL 14  Creatinine 0.61 - 1.24 mg/dL 1.08  Sodium 135 - 145 mmol/L 133(L)  Potassium 3.5 - 5.1 mmol/L 4.3  Chloride 98 - 111 mmol/L 99  CO2 22 - 32 mmol/L 24  Calcium 8.9 - 10.3 mg/dL 9.5  Total Protein 6.5 - 8.1 g/dL 7.6  Total Bilirubin 0.3 - 1.2 mg/dL 2.1(H)  Alkaline Phos 38 - 126 U/L 139(H)  AST 15 - 41 U/L 27  ALT 0 - 44 U/L 53(H)   CBC Latest Ref Rng & Units 05/10/2019  WBC 4.0 - 10.5 K/uL 14.5(H)  Hemoglobin 13.0 - 17.0 g/dL 15.2  Hematocrit 39.0 - 52.0 % 43.1  Platelets 150 - 400 K/uL 399    RADIOGRAPHIC STUDIES: I have personally reviewed the radiological images as listed and agreed with the findings in the report. DG Eye Foreign Body  Result Date: 04/26/2019 CLINICAL DATA:  Screening orbits for MRI EXAM: ORBITS FOR FOREIGN BODY - 2 VIEW COMPARISON:  10/31/2013 FINDINGS: Two frontal views of the orbits demonstrate no radiopaque foreign bodies. No bony abnormalities. Sinuses are clear. IMPRESSION: 1. No radiopaque foreign bodies.  Electronically Signed   By: Legrand Como  Owens Shark M.D.   On: 04/26/2019 15:18   CT CHEST W CONTRAST  Result Date: 04/29/2019 CLINICAL DATA:  Cancer of unknown primary. EXAM: CT CHEST WITH CONTRAST TECHNIQUE: Multidetector CT imaging of the chest was performed during intravenous contrast administration. CONTRAST:  53m OMNIPAQUE IOHEXOL 300 MG/ML  SOLN COMPARISON:  April 26, 2019. November 11, 2013. FINDINGS: Cardiovascular: Atherosclerosis of thoracic aorta is noted without aneurysm or dissection. Normal cardiac size. No pericardial effusion. Mild coronary calcifications are noted. Mediastinum/Nodes: No enlarged mediastinal, hilar, or axillary lymph nodes. Thyroid gland, trachea, and esophagus demonstrate no significant findings. Lungs/Pleura: No pneumothorax or pleural effusion is noted. Mild emphysematous disease is noted in the upper lobes. 5 mm nodular density is seen in the right lower lobe just above the diaphragm best seen on image number 111 of series 3. Upper Abdomen: Liver mass and pancreatic mass are again noted as described on recent MRI. Musculoskeletal: No chest wall abnormality. No acute or significant osseous findings. IMPRESSION: 1. 5 mm nodular density is noted in right lower lobe just above the diaphragm. Given the recent diagnosis of pancreatic and hepatic masses, metastatic disease cannot be excluded and follow-up chest CT in 3 months is recommended. 2. Mild coronary calcifications are noted. 3. Liver mass and pancreatic mass are again noted as described on recent MRI. Aortic Atherosclerosis (ICD10-I70.0) and Emphysema (ICD10-J43.9). Electronically Signed   By: JMarijo ConceptionM.D.   On: 04/29/2019 13:31   PERIPHERAL VASCULAR CATHETERIZATION  Result Date: 05/20/2019 See op note  NM PET Image Initial (PI) Skull Base To Thigh  Result Date: 05/07/2019 CLINICAL DATA:  Initial treatment strategy for pancreatic cancer. History of renal cell carcinoma in 2016 with nephrectomy. EXAM: NUCLEAR MEDICINE  PET SKULL BASE TO THIGH TECHNIQUE: 9.4 mCi F-18 FDG was injected intravenously. Full-ring PET imaging was performed from the skull base to thigh after the radiotracer. CT data was obtained and used for attenuation correction and anatomic localization. Fasting blood glucose: 103 mg/dl COMPARISON:  04/29/2019 chest CT.  04/26/2019 abdominal MRI. FINDINGS: Mediastinal blood pool activity: SUV max 2.4 Liver activity: SUV max NA NECK: No areas of abnormal hypermetabolism. Incidental CT findings: Bilateral carotid atherosclerosis. No cervical adenopathy. CHEST: No pulmonary parenchymal or thoracic nodal hypermetabolism. Incidental CT findings: Deferred to recent diagnostic CT. Aortic and coronary artery atherosclerosis. Centrilobular and paraseptal emphysema. ABDOMEN/PELVIS: Segment 2 lesion measures 2.9 cm and a S.U.V. max of 9.4 on 151/2. Segment 5 lesion measures 2.0 cm and a S.U.V. max of 4.8 on 179/3. Pancreatic head mass measures on the order of 3.5 cm and a S.U.V. max of 4.8 on 178/3. A node adjacent the SMA measures 8 mm and a S.U.V. max of 4.0 on 181/3. Gastric cardia underdistention and hypermetabolism, including at a S.U.V. max of 7.3 on 136/3. Incidental CT findings: Left adrenal thickening is low-density, suggesting hyperplasia are adenoma. Left renal vascular calcification. Right nephrectomy without local recurrence. Common duct stent in place. Small gallstone. Abdominal aortic atherosclerosis. SKELETON: No abnormal marrow activity. Incidental CT findings: Degenerative changes of both hips. IMPRESSION: 1. Hypermetabolic pancreatic primary with bilateral hepatic and peripancreatic nodal metastasis. 2. No evidence of hypermetabolic extraabdominal metastatic disease. 3. Right nephrectomy, without locally recurrent disease. 4. Proximal gastric hypermetabolism could be physiologic or represent gastritis. 5. Incidental findings, including: Aortic atherosclerosis (ICD10-I70.0), coronary artery atherosclerosis and  emphysema (ICD10-J43.9). Cholelithiasis. Electronically Signed   By: KAbigail MiyamotoM.D.   On: 05/07/2019 13:33   [place]BIOPSY (LIVER)  Result Date: 05/10/2019 INDICATION: Mass of  the head of the pancreas with 2 lesions in the liver suspicious for metastatic disease. EXAM: ULTRASOUND GUIDED CORE BIOPSY OF LIVER MEDICATIONS: None. ANESTHESIA/SEDATION: Fentanyl 50 mcg IV; Versed 1.0 mg IV Moderate Sedation Time:  16 minutes. The patient was continuously monitored during the procedure by the interventional radiology nurse under my direct supervision. PROCEDURE: The procedure, risks, benefits, and alternatives were explained to the patient. Questions regarding the procedure were encouraged and answered. The patient understands and consents to the procedure. A time-out was performed prior to initiating the procedure. Ultrasound was utilized to localize a lesion within the left lobe of the liver. The anterior abdominal wall was prepped with chlorhexidine in a sterile fashion, and a sterile drape was applied covering the operative field. A sterile gown and sterile gloves were used for the procedure. Local anesthesia was provided with 1% Lidocaine. Under ultrasound guidance, a 75 gauge trocar needle was advanced to the level of a lesion within the posterior aspect of the left lobe of the liver. After confirming needle tip position, 3 separate coaxial 18 gauge core biopsy samples were obtained. Samples were submitted in formalin. A slurry of Gel-Foam pledgets mixed in sterile saline was then injected through the outer needle as the needle was retracted and removed. Additional ultrasound was performed. COMPLICATIONS: None immediate. FINDINGS: Well-circumscribed hypoechoic mass within the posterior aspect of the left lobe of the liver measures approximately 3.7 cm in greatest diameter. Solid tissue was obtained. IMPRESSION: Ultrasound-guided core biopsy performed of a lesion within the left lobe of the liver measuring  approximately 3.7 cm in greatest diameter. Electronically Signed   By: Aletta Edouard M.D.   On: 05/10/2019 12:39   DG C-Arm 1-60 Min-No Report  Result Date: 04/28/2019 Fluoroscopy was utilized by the requesting physician.  No radiographic interpretation.   MR ABDOMEN MRCP W WO CONTAST  Result Date: 04/26/2019 CLINICAL DATA:  Liver lesion, cholelithiasis, history of renal cell carcinoma and right nephrectomy EXAM: MRI ABDOMEN WITHOUT AND WITH CONTRAST (INCLUDING MRCP) TECHNIQUE: Multiplanar multisequence MR imaging of the abdomen was performed both before and after the administration of intravenous contrast. Heavily T2-weighted images of the biliary and pancreatic ducts were obtained, and three-dimensional MRCP images were rendered by post processing. CONTRAST:  7.82m GADAVIST GADOBUTROL 1 MMOL/ML IV SOLN COMPARISON:  Abdominal ultrasound, 04/26/2019, CT abdomen pelvis, 06/30/2014, MR abdomen pelvis, 10/31/2013 FINDINGS: Lower chest: No acute findings. Hepatobiliary: There are T2 hyperintense, intrinsically T1 hypointense lesions of the subcapsular anterior right lobe of the liver adjacent to the gallbladder fossa, hepatic segment V, measuring 2.4 x 2.0 cm and of the posterior left lobe of the liver, segment III, measuring 3.7 x 2.4 cm. These lesions demonstrate central hypoenhancement with progressive rim enhancement on multiphasic contrast enhanced imaging. There are additional small, fluid signal nonenhancing cysts of the liver. Extrahepatic biliary ductal dilatation, with enlargement of the common bile duct up to 14 mm. Pancreas: There is a T2 hyperintense, slightly T1 hypointense, hypoenhancing mass of the pancreatic head measuring approximately 3.6 x 3.1 cm (series 21, image 54). This lesion is situated very laterally and near the ampulla, and does not closely abut the portal vein, superior mesenteric vein, celiac axis, superior mesenteric artery. Spleen:  Within normal limits in size and appearance.  Adrenals/Urinary Tract: Status post right nephrectomy. No masses identified. No evidence of hydronephrosis. Stomach/Bowel: Visualized portions within the abdomen are unremarkable. Vascular/Lymphatic: No pathologically enlarged lymph nodes identified. No abdominal aortic aneurysm demonstrated. Other:  None. Musculoskeletal: No suspicious bone lesions  identified. IMPRESSION: 1. Hypoenhancing mass of the pancreatic head measuring approximately 3.6 cm, obstructing the common bile duct near the ampulla, consistent with pancreatic adenocarcinoma. The pancreatic duct is nondilated. 2. 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 3.  No significant lymphadenopathy. 4. Status post right nephrectomy. No evidence of malignant recurrence at the nephrectomy site. 5.  Cholelithiasis. Electronically Signed   By: Eddie Candle M.D.   On: 04/26/2019 16:56   US Abdomen Limited RUQ  Result Date: 04/26/2019 CLINICAL DATA:  Liver disease. History of a right nephrectomy for renal cell carcinoma. EXAM: ULTRASOUND ABDOMEN LIMITED RIGHT UPPER QUADRANT COMPARISON:  CT, 06/30/2014.  MRI, 10/31/2013. FINDINGS: Gallbladder: Distended. Some dependent sludge. 1.2 cm apparent nonshadowing gallstone. Several echogenic foci along the wall consistent with small polyps. The apparent stone could potentially reflect a polyp. No wall thickening. No pericholecystic fluid. Common bile duct: Diameter: 13 mm.  No visualized duct stone or sludge. Liver: Increased liver parenchymal echogenicity. Subtle nodular contour. There is a hypoechoic mass with a nearly anechoic partial rim, along the posterior left lobe, measuring 3.6 x 2.4 x 3.4 cm, not visualized on the prior MRI or CT. No other liver masses or lesions. Portal vein is patent on color Doppler imaging with normal direction of blood flow towards the liver. Other: None. IMPRESSION: 1. 3.6 cm hypoechoic mass along the posterior margin of the left liver lobe, not visualized on the  prior studies. Recommend follow-up liver MRI without and with contrast for further assessment. 2. Gallbladder sludge, probable small polyps and a 1.2 cm nonshadowing stone versus a larger polyp. No evidence of acute cholecystitis. 3. Dilated common bile duct to 1.3 cm, increased in size compared to the prior studies. No visualized duct stone. Recommend follow-up MRCP to accompany the liver MRI, for further assessment. Electronically Signed   By: Lajean Manes M.D.   On: 04/26/2019 12:19     Assessment and plan Patient is a 64 y.o. 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 1. Goals of care, counseling/discussion   2. Pancreatic adenocarcinoma ([place])   3. Obstructive jaundice   4. Drug-induced constipation   5. Neoplasm related pain   6. Encounter for antineoplastic chemotherapy    #stage IV pancreatic cancer  Labs are reviewed and discussed with patient. Counts are acceptable to proceed with cycle 1 FOLFIRINOX. 5-FU pump DC'd on day 4 with Onpro kit for G-CSF support. Patient will get 500 cc IV fluid on day 4 for hydration. Growth factor-Neulasta Onpro kit would be given as prophylaxis for chemotherapy-induced neutropenia to prevent febrile neutropenias. Discussed side effects, including but not limited to myalgias/arthralgias- {recommend Claritin for 4 days), hypersensitivity, injection site reactions, inflammation, bleeding, splenic rupture, etc  #Obstructive jaundice, status post biliary stent. Bilirubin continues to improve, today at 1.6.  #Constipation, likely secondary to narcotic use.  Continue Colace daily and MiraLAX as needed. He is starting chemotherapy today, irinotecan may cause diarrhea.  Hold off escalating bowel regimen at this point.  Further titrating the future.  Patient also has Imodium prescription at home in case he develops diarrhea.  I  reviewed the instructions of antiemetics, and the diarrhea, constipation medications in details with patient and wife.  #Neoplasm related pain, continue oxycodone 5 mg every 6 hours as needed for pain. Patient to see palliative care service Supportive care measures are necessary for patient well-being and  will be provided as necessary. We spent sufficient time to discuss many aspect of care, questions were answered to patient's satisfaction.    Follow up in 1 week.   Earlie Server, MD, PhD Hematology Oncology [facility] Pager- [phone] 05/24/2019

## 2019-05-24 NOTE — Progress Notes (Signed)
Pt in for follow up and first treatment of Folfirinox today. Unsure of exactly what to expect today.  States still having constipation, using pain med, appetite has decreased significantly in last week.

## 2019-05-27 ENCOUNTER — Other Ambulatory Visit: Payer: Self-pay | Admitting: Oncology

## 2019-05-27 ENCOUNTER — Inpatient Hospital Stay: Payer: BC Managed Care – PPO

## 2019-05-27 ENCOUNTER — Telehealth: Payer: Self-pay

## 2019-05-27 ENCOUNTER — Other Ambulatory Visit: Payer: Self-pay

## 2019-05-27 VITALS — BP 97/62 | HR 75 | Temp 96.3°F | Resp 18 | Wt 167.8 lb

## 2019-05-27 DIAGNOSIS — C259 Malignant neoplasm of pancreas, unspecified: Secondary | ICD-10-CM | POA: Diagnosis not present

## 2019-05-27 DIAGNOSIS — K5903 Drug induced constipation: Secondary | ICD-10-CM | POA: Diagnosis not present

## 2019-05-27 DIAGNOSIS — Z5111 Encounter for antineoplastic chemotherapy: Secondary | ICD-10-CM

## 2019-05-27 DIAGNOSIS — Z905 Acquired absence of kidney: Secondary | ICD-10-CM | POA: Diagnosis not present

## 2019-05-27 DIAGNOSIS — Z85528 Personal history of other malignant neoplasm of kidney: Secondary | ICD-10-CM | POA: Diagnosis not present

## 2019-05-27 DIAGNOSIS — G893 Neoplasm related pain (acute) (chronic): Secondary | ICD-10-CM | POA: Diagnosis not present

## 2019-05-27 DIAGNOSIS — E871 Hypo-osmolality and hyponatremia: Secondary | ICD-10-CM | POA: Diagnosis not present

## 2019-05-27 DIAGNOSIS — T402X5A Adverse effect of other opioids, initial encounter: Secondary | ICD-10-CM | POA: Diagnosis not present

## 2019-05-27 DIAGNOSIS — I10 Essential (primary) hypertension: Secondary | ICD-10-CM | POA: Diagnosis not present

## 2019-05-27 DIAGNOSIS — C787 Secondary malignant neoplasm of liver and intrahepatic bile duct: Secondary | ICD-10-CM | POA: Diagnosis not present

## 2019-05-27 DIAGNOSIS — R112 Nausea with vomiting, unspecified: Secondary | ICD-10-CM

## 2019-05-27 DIAGNOSIS — C772 Secondary and unspecified malignant neoplasm of intra-abdominal lymph nodes: Secondary | ICD-10-CM | POA: Diagnosis not present

## 2019-05-27 DIAGNOSIS — F419 Anxiety disorder, unspecified: Secondary | ICD-10-CM | POA: Diagnosis not present

## 2019-05-27 DIAGNOSIS — K219 Gastro-esophageal reflux disease without esophagitis: Secondary | ICD-10-CM

## 2019-05-27 DIAGNOSIS — E876 Hypokalemia: Secondary | ICD-10-CM | POA: Diagnosis not present

## 2019-05-27 DIAGNOSIS — N189 Chronic kidney disease, unspecified: Secondary | ICD-10-CM | POA: Diagnosis not present

## 2019-05-27 DIAGNOSIS — Z5189 Encounter for other specified aftercare: Secondary | ICD-10-CM | POA: Diagnosis not present

## 2019-05-27 DIAGNOSIS — F17210 Nicotine dependence, cigarettes, uncomplicated: Secondary | ICD-10-CM | POA: Diagnosis not present

## 2019-05-27 MED ORDER — OMEPRAZOLE 20 MG PO CPDR
20.0000 mg | DELAYED_RELEASE_CAPSULE | Freq: Two times a day (BID) | ORAL | 0 refills | Status: AC
Start: 2019-05-27 — End: ?

## 2019-05-27 MED ORDER — SODIUM CHLORIDE 0.9 % IV SOLN
Freq: Once | INTRAVENOUS | Status: AC
  Filled 2019-05-27: qty 250

## 2019-05-27 MED ORDER — PEGFILGRASTIM 6 MG/0.6ML ~~LOC~~ PSKT
6.0000 mg | PREFILLED_SYRINGE | Freq: Once | SUBCUTANEOUS | Status: AC
  Administered 2019-05-27: 6 mg via SUBCUTANEOUS
  Filled 2019-05-27: qty 0.6

## 2019-05-27 MED ORDER — HEPARIN SOD (PORK) LOCK FLUSH 100 UNIT/ML IV SOLN
500.0000 [IU] | Freq: Once | INTRAVENOUS | Status: AC
  Administered 2019-05-27: 500 [IU] via INTRAVENOUS
  Filled 2019-05-27: qty 5

## 2019-05-27 MED ORDER — ONDANSETRON HCL 4 MG/2ML IJ SOLN
8.0000 mg | Freq: Once | INTRAMUSCULAR | Status: AC
  Administered 2019-05-27: 8 mg via INTRAVENOUS
  Filled 2019-05-27: qty 4

## 2019-05-27 MED ORDER — PANTOPRAZOLE SODIUM 40 MG IV SOLR
40.0000 mg | Freq: Once | INTRAVENOUS | Status: AC
  Administered 2019-05-27: 40 mg via INTRAVENOUS
  Filled 2019-05-27: qty 40

## 2019-05-27 MED ORDER — DEXAMETHASONE SODIUM PHOSPHATE 10 MG/ML IJ SOLN
10.0000 mg | Freq: Once | INTRAMUSCULAR | Status: AC
  Administered 2019-05-27: 10 mg via INTRAVENOUS
  Filled 2019-05-27: qty 1

## 2019-05-27 NOTE — Telephone Encounter (Signed)
T/C to patient for follow up after receiving first infusion on Friday.    Phone reception very poor and pt states he is at cancer center now getting IV fluids.   Line got disconnected.   Will check on pt while he is here.

## 2019-05-29 LAB — CANCER ANTIGEN 19-9: CA 19-9: 3226 U/mL — ABNORMAL HIGH (ref 0–35)

## 2019-05-29 NOTE — Telephone Encounter (Signed)
Received request for additional information from [facility]. Provider name and NPI number faxed as requested.

## 2019-05-31 ENCOUNTER — Inpatient Hospital Stay (HOSPITAL_BASED_OUTPATIENT_CLINIC_OR_DEPARTMENT_OTHER): Payer: BC Managed Care – PPO | Admitting: Hospice and Palliative Medicine

## 2019-05-31 ENCOUNTER — Inpatient Hospital Stay (HOSPITAL_BASED_OUTPATIENT_CLINIC_OR_DEPARTMENT_OTHER): Payer: BC Managed Care – PPO | Admitting: Oncology

## 2019-05-31 ENCOUNTER — Encounter: Payer: Self-pay | Admitting: Oncology

## 2019-05-31 ENCOUNTER — Inpatient Hospital Stay: Payer: BC Managed Care – PPO

## 2019-05-31 ENCOUNTER — Other Ambulatory Visit: Payer: Self-pay

## 2019-05-31 VITALS — BP 104/70 | HR 91 | Temp 96.7°F | Resp 18 | Wt 169.6 lb

## 2019-05-31 DIAGNOSIS — C787 Secondary malignant neoplasm of liver and intrahepatic bile duct: Secondary | ICD-10-CM | POA: Diagnosis not present

## 2019-05-31 DIAGNOSIS — F419 Anxiety disorder, unspecified: Secondary | ICD-10-CM | POA: Diagnosis not present

## 2019-05-31 DIAGNOSIS — Z515 Encounter for palliative care: Secondary | ICD-10-CM

## 2019-05-31 DIAGNOSIS — F17210 Nicotine dependence, cigarettes, uncomplicated: Secondary | ICD-10-CM | POA: Diagnosis not present

## 2019-05-31 DIAGNOSIS — G893 Neoplasm related pain (acute) (chronic): Secondary | ICD-10-CM

## 2019-05-31 DIAGNOSIS — C772 Secondary and unspecified malignant neoplasm of intra-abdominal lymph nodes: Secondary | ICD-10-CM | POA: Diagnosis not present

## 2019-05-31 DIAGNOSIS — E876 Hypokalemia: Secondary | ICD-10-CM | POA: Diagnosis not present

## 2019-05-31 DIAGNOSIS — E871 Hypo-osmolality and hyponatremia: Secondary | ICD-10-CM | POA: Diagnosis not present

## 2019-05-31 DIAGNOSIS — C259 Malignant neoplasm of pancreas, unspecified: Secondary | ICD-10-CM

## 2019-05-31 DIAGNOSIS — K5903 Drug induced constipation: Secondary | ICD-10-CM

## 2019-05-31 DIAGNOSIS — Z5111 Encounter for antineoplastic chemotherapy: Secondary | ICD-10-CM | POA: Diagnosis not present

## 2019-05-31 DIAGNOSIS — N189 Chronic kidney disease, unspecified: Secondary | ICD-10-CM | POA: Diagnosis not present

## 2019-05-31 DIAGNOSIS — I10 Essential (primary) hypertension: Secondary | ICD-10-CM | POA: Diagnosis not present

## 2019-05-31 DIAGNOSIS — T402X5A Adverse effect of other opioids, initial encounter: Secondary | ICD-10-CM | POA: Diagnosis not present

## 2019-05-31 DIAGNOSIS — Z5189 Encounter for other specified aftercare: Secondary | ICD-10-CM | POA: Diagnosis not present

## 2019-05-31 DIAGNOSIS — Z85528 Personal history of other malignant neoplasm of kidney: Secondary | ICD-10-CM | POA: Diagnosis not present

## 2019-05-31 DIAGNOSIS — Z905 Acquired absence of kidney: Secondary | ICD-10-CM | POA: Diagnosis not present

## 2019-05-31 LAB — COMPREHENSIVE METABOLIC PANEL
ALT: 107 U/L — ABNORMAL HIGH (ref 0–44)
AST: 74 U/L — ABNORMAL HIGH (ref 15–41)
Albumin: 3.3 g/dL — ABNORMAL LOW (ref 3.5–5.0)
Alkaline Phosphatase: 286 U/L — ABNORMAL HIGH (ref 38–126)
Anion gap: 10 (ref 5–15)
BUN: 16 mg/dL (ref 8–23)
CO2: 22 mmol/L (ref 22–32)
Calcium: 8.5 mg/dL — ABNORMAL LOW (ref 8.9–10.3)
Chloride: 99 mmol/L (ref 98–111)
Creatinine, Ser: 1.16 mg/dL (ref 0.61–1.24)
GFR calc Af Amer: >60 mL/min (ref >60)
GFR calc non Af Amer: >60 mL/min (ref >60)
Glucose, Bld: 126 mg/dL — ABNORMAL HIGH (ref 70–99)
Potassium: 3.3 mmol/L — ABNORMAL LOW (ref 3.5–5.1)
Sodium: 131 mmol/L — ABNORMAL LOW (ref 135–145)
Total Bilirubin: 1.2 mg/dL (ref 0.3–1.2)
Total Protein: 6.5 g/dL (ref 6.5–8.1)

## 2019-05-31 LAB — CBC WITH DIFFERENTIAL/PLATELET
Abs Immature Granulocytes: 1.45 10*3/uL — ABNORMAL HIGH (ref 0.00–0.07)
Basophils Absolute: 0.1 10*3/uL (ref 0.0–0.1)
Basophils Relative: 0 %
Eosinophils Absolute: 0.4 10*3/uL (ref 0.0–0.5)
Eosinophils Relative: 1 %
HCT: 34 % — ABNORMAL LOW (ref 39.0–52.0)
Hemoglobin: 12.2 g/dL — ABNORMAL LOW (ref 13.0–17.0)
Immature Granulocytes: 3 %
Lymphocytes Relative: 3 %
Lymphs Abs: 1.5 10*3/uL (ref 0.7–4.0)
MCH: 31.2 pg (ref 26.0–34.0)
MCHC: 35.9 g/dL (ref 30.0–36.0)
MCV: 87 fL (ref 80.0–100.0)
Monocytes Absolute: 1.5 10*3/uL — ABNORMAL HIGH (ref 0.1–1.0)
Monocytes Relative: 3 %
Neutro Abs: 43.9 10*3/uL — ABNORMAL HIGH (ref 1.7–7.7)
Neutrophils Relative %: 90 %
Platelets: 321 10*3/uL (ref 150–400)
RBC: 3.91 MIL/uL — ABNORMAL LOW (ref 4.22–5.81)
RDW: 14.2 % (ref 11.5–15.5)
Smear Review: ADEQUATE
WBC: 48.8 10*3/uL — ABNORMAL HIGH (ref 4.0–10.5)
nRBC: 0 % (ref 0.0–0.2)

## 2019-05-31 MED ORDER — SODIUM CHLORIDE 0.9% FLUSH
10.0000 mL | INTRAVENOUS | Status: DC | PRN
  Administered 2019-05-31: 10 mL via INTRAVENOUS
  Filled 2019-05-31: qty 10

## 2019-05-31 MED ORDER — HEPARIN SOD (PORK) LOCK FLUSH 100 UNIT/ML IV SOLN
500.0000 [IU] | Freq: Once | INTRAVENOUS | Status: AC
  Administered 2019-05-31: 500 [IU] via INTRAVENOUS
  Filled 2019-05-31: qty 5

## 2019-05-31 MED ORDER — SODIUM CHLORIDE 0.9 % IV SOLN
INTRAVENOUS | Status: DC
  Filled 2019-05-31 (×2): qty 1000

## 2019-05-31 MED ORDER — SENNA 8.6 MG PO TABS: 2.0000 | ORAL_TABLET | Freq: Every day | ORAL | 0 refills | Status: AC

## 2019-05-31 NOTE — Progress Notes (Signed)
[facility]  [phone] [phone]   Name: Kenneth Maxwell Date: 05/31/2019 MRN: [id number]  DOB: 1955/10/17  Patient Care Team: Olin Hauser, DO as PCP - General (Family Medicine) Clent Jacks, RN as Oncology Nurse Navigator    REASON FOR CONSULTATION: Kenneth Maxwell is a 64 y.o. 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  Patient was hospitalized 04/26/2019 to 04/29/2019 with painless jaundice and generalized weakness.  Work-up revealed a large pancreatic mass obstructing the CBD with metastases to the liver.  Patient underwent biliary stenting.  Patient was started on FOLFIRINOX chemotherapy with palliative intent.  He was referred to palliative care to help address goals and manage ongoing symptoms.  SOCIAL HISTORY:     reports that he has been smoking cigarettes. He has a 50.00 pack-year smoking history. He uses smokeless tobacco. He reports previous alcohol use of about 49.0 standard drinks of alcohol per week. He reports that he does not use drugs.   Patient is married and lives at home with his wife. He has 3 children who lives on the same property. Patient worked as a Public affairs consultant.  ADVANCE DIRECTIVES:  Does not have  CODE STATUS:   PAST MEDICAL HISTORY: Past Medical History:  Diagnosis Date  . Alcohol abuse   . Arthritis   . Blood clotting disorder ([place])   . Chronic renal disease   . Encounter for antineoplastic chemotherapy 05/24/2019  . H/O urinary retention   . Hematuria, gross   . Hypertension   . Renal cell carcinoma ([place]) 11/2013   kidney cancer s/p R nephrectomy  . Right renal mass     PAST SURGICAL HISTORY:  Past Surgical History:  Procedure Laterality Date  . ERCP N/A 04/28/2019   Procedure:  ENDOSCOPIC RETROGRADE CHOLANGIOPANCREATOGRAPHY (ERCP);  Surgeon: Lucilla Lame, MD;  Location: [facility] ENDOSCOPY;  Service: Endoscopy;  Laterality: N/A;  . kidney removed Right   . liver stent Right   . NEPHRECTOMY RADICAL Right 11/2013   R side kidney cancer surgery, non adrenal sparing  . PORTA CATH INSERTION N/A 05/20/2019   Procedure: PORTA CATH INSERTION;  Surgeon: Algernon Huxley, MD;  Location: [place] LAB;  Service: Cardiovascular;  Laterality: N/A;    HEMATOLOGY/ONCOLOGY HISTORY:  Oncology History  Pancreatic adenocarcinoma ([place])  04/26/2019 Initial Diagnosis   Pancreatic adenocarcinoma ([place])   05/24/2019 -  Chemotherapy   The patient had palonosetron (ALOXI) injection 0.25 mg, 0.25 mg, Intravenous,  Once, 1 of 6 cycles Administration: 0.25 mg (05/24/2019) pegfilgrastim (NEULASTA ONPRO KIT) injection 6 mg, 6 mg, Subcutaneous, Once, 1 of 6 cycles irinotecan (CAMPTOSAR) 300 mg in sodium chloride 0.9 % 500 mL chemo infusion, 150 mg/m2 = 300 mg, Intravenous,  Once, 1 of 6 cycles Administration: 300 mg (05/24/2019) oxaliplatin (ELOXATIN) 170 mg in dextrose 5 % 500 mL chemo infusion, 85 mg/m2 = 170 mg, Intravenous,  Once, 1 of 6 cycles Administration: 170 mg (05/24/2019) fosaprepitant (EMEND) 150 mg in sodium chloride 0.9 % 145 mL IVPB, 150 mg, Intravenous,  Once, 1 of 6 cycles Administration: 150 mg (05/24/2019) fluorouracil (ADRUCIL) 4,850 mg in sodium chloride 0.9 % 53 mL chemo infusion, 2,400 mg/m2 = 4,850 mg, Intravenous, 1 Day/Dose, 1 of 6 cycles Administration: 4,850 mg (05/24/2019) leucovorin 800 mg in sodium chloride 0.9 % 250 mL infusion, 808 mg,  Intravenous,  Once, 1 of 6 cycles Administration: 800 mg (05/24/2019)  for chemotherapy treatment.      ALLERGIES:  is allergic to codeine.  MEDICATIONS:  Current Outpatient Medications  Medication Sig Dispense Refill  . amLODipine (NORVASC) 10 MG tablet Take 1 tablet (10 mg total) by mouth daily. 90 tablet 3  . dexamethasone  (DECADRON) 4 MG tablet Take 2 tablets (8 mg total) by mouth daily. Start the day after chemotherapy for 3 days. Take with food. 8 tablet 5  . lidocaine-prilocaine (EMLA) cream Apply to affected area once 30 g 3  . loperamide (IMODIUM A-D) 2 MG tablet Take 2 at onset of diarrhea, then 1 every 2hrs until 12hr without a BM. May take 2 tab every 4hrs at bedtime. If diarrhea recurs repeat. (Patient not taking: Reported on 05/24/2019) 100 tablet 1  . losartan (COZAAR) 100 MG tablet Take 1 tablet (100 mg total) by mouth daily. 90 tablet 3  . Multiple Vitamin (MULTIVITAMIN WITH MINERALS) TABS tablet Take 1 tablet by mouth daily.    Marland Kitchen omeprazole (PRILOSEC) 20 MG capsule Take 1 capsule (20 mg total) by mouth 2 (two) times daily before a meal. 60 capsule 0  . oxyCODONE (ROXICODONE) 5 MG immediate release tablet Take 1 tablet (5 mg total) by mouth every 6 (six) hours as needed for severe pain. 30 tablet 0  . Polyethylene Glycol 3350 (MIRALAX PO) Take by mouth. BID    . prochlorperazine (COMPAZINE) 10 MG tablet Take 1 tablet (10 mg total) by mouth every 6 (six) hours as needed (Nausea or vomiting). 30 tablet 1  . senna (SENOKOT) 8.6 MG TABS tablet Take 2 tablets (17.2 mg total) by mouth daily. 120 tablet 0  . spironolactone (ALDACTONE) 25 MG tablet Take 1 tablet (25 mg total) by mouth daily. 30 tablet 1   No current facility-administered medications for this visit.   Facility-Administered Medications Ordered in Other Visits  Medication Dose Route Frequency Provider Last Rate Last Admin  . [COMPLETED] heparin lock flush 100 unit/mL  500 Units Intravenous Once Earlie Server, MD   500 Units at 05/31/19 1545  . sodium chloride 0.9 % 1,000 mL with potassium chloride 20 mEq infusion   Intravenous Continuous Earlie Server, MD 999 mL/hr at 05/31/19 1441 New Bag at 05/31/19 1441  . sodium chloride flush (NS) 0.9 % injection 10 mL  10 mL Intravenous PRN Earlie Server, MD   10 mL at 05/31/19 1314    VITAL SIGNS: There were no vitals  taken for this visit. There were no vitals filed for this visit.  Estimated body mass index is 23.65 kg/m as calculated from the following:   Height as of 05/20/19: 5' 11"  (1.803 m).   Weight as of an earlier encounter on 05/31/19: 169 lb 9.6 oz (76.9 kg).  LABS: CBC:    Component Value Date/Time   WBC 48.8 (H) 05/31/2019 1310   HGB 12.2 (L) 05/31/2019 1310   HGB 13.5 11/13/2013 0422   HCT 34.0 (L) 05/31/2019 1310   HCT 40.5 11/13/2013 0422   PLT 321 05/31/2019 1310   PLT 185 11/13/2013 0422   MCV 87.0 05/31/2019 1310   MCV 93 11/13/2013 0422   NEUTROABS 43.9 (H) 05/31/2019 1310   NEUTROABS 8.4 (H) 11/13/2013 0422   LYMPHSABS 1.5 05/31/2019 1310   LYMPHSABS 0.9 (L) 11/13/2013 0422   MONOABS 1.5 (H) 05/31/2019 1310   MONOABS 1.0 11/13/2013 0422   EOSABS 0.4 05/31/2019 1310   EOSABS 0.0 11/13/2013 0422  BASOSABS 0.1 05/31/2019 1310   BASOSABS 0.0 11/13/2013 0422   Comprehensive Metabolic Panel:    Component Value Date/Time   NA 131 (L) 05/31/2019 1310   NA 140 11/13/2013 0422   K 3.3 (L) 05/31/2019 1310   K 3.6 11/13/2013 0422   CL 99 05/31/2019 1310   CL 104 11/13/2013 0422   CO2 22 05/31/2019 1310   CO2 30 11/13/2013 0422   BUN 16 05/31/2019 1310   BUN 9 11/13/2013 0422   CREATININE 1.16 05/31/2019 1310   CREATININE 1.38 (H) 04/25/2019 0920   GLUCOSE 126 (H) 05/31/2019 1310   GLUCOSE 90 11/13/2013 0422   CALCIUM 8.5 (L) 05/31/2019 1310   CALCIUM 7.9 (L) 11/13/2013 0422   AST 74 (H) 05/31/2019 1310   AST 28 11/11/2013 1403   ALT 107 (H) 05/31/2019 1310   ALT 35 11/11/2013 1403   ALKPHOS 286 (H) 05/31/2019 1310   ALKPHOS 71 11/11/2013 1403   BILITOT 1.2 05/31/2019 1310   BILITOT 0.5 11/11/2013 1403   PROT 6.5 05/31/2019 1310   PROT 6.0 (L) 11/11/2013 1403   ALBUMIN 3.3 (L) 05/31/2019 1310   ALBUMIN 3.2 (L) 11/11/2013 1403    RADIOGRAPHIC STUDIES: PERIPHERAL VASCULAR CATHETERIZATION  Result Date: 05/20/2019 See op note  NM PET Image Initial (PI) Skull  Base To Thigh  Result Date: 05/07/2019 CLINICAL DATA:  Initial treatment strategy for pancreatic cancer. History of renal cell carcinoma in 2016 with nephrectomy. EXAM: NUCLEAR MEDICINE PET SKULL BASE TO THIGH TECHNIQUE: 9.4 mCi F-18 FDG was injected intravenously. Full-ring PET imaging was performed from the skull base to thigh after the radiotracer. CT data was obtained and used for attenuation correction and anatomic localization. Fasting blood glucose: 103 mg/dl COMPARISON:  04/29/2019 chest CT.  04/26/2019 abdominal MRI. FINDINGS: Mediastinal blood pool activity: SUV max 2.4 Liver activity: SUV max NA NECK: No areas of abnormal hypermetabolism. Incidental CT findings: Bilateral carotid atherosclerosis. No cervical adenopathy. CHEST: No pulmonary parenchymal or thoracic nodal hypermetabolism. Incidental CT findings: Deferred to recent diagnostic CT. Aortic and coronary artery atherosclerosis. Centrilobular and paraseptal emphysema. ABDOMEN/PELVIS: Segment 2 lesion measures 2.9 cm and a S.U.V. max of 9.4 on 151/2. Segment 5 lesion measures 2.0 cm and a S.U.V. max of 4.8 on 179/3. Pancreatic head mass measures on the order of 3.5 cm and a S.U.V. max of 4.8 on 178/3. A node adjacent the SMA measures 8 mm and a S.U.V. max of 4.0 on 181/3. Gastric cardia underdistention and hypermetabolism, including at a S.U.V. max of 7.3 on 136/3. Incidental CT findings: Left adrenal thickening is low-density, suggesting hyperplasia are adenoma. Left renal vascular calcification. Right nephrectomy without local recurrence. Common duct stent in place. Small gallstone. Abdominal aortic atherosclerosis. SKELETON: No abnormal marrow activity. Incidental CT findings: Degenerative changes of both hips. IMPRESSION: 1. Hypermetabolic pancreatic primary with bilateral hepatic and peripancreatic nodal metastasis. 2. No evidence of hypermetabolic extraabdominal metastatic disease. 3. Right nephrectomy, without locally recurrent disease. 4.  Proximal gastric hypermetabolism could be physiologic or represent gastritis. 5. Incidental findings, including: Aortic atherosclerosis (ICD10-I70.0), coronary artery atherosclerosis and emphysema (ICD10-J43.9). Cholelithiasis. Electronically Signed   By: Abigail Miyamoto M.D.   On: 05/07/2019 13:33   US BIOPSY (LIVER)  Result Date: 05/10/2019 INDICATION: Mass of the head of the pancreas with 2 lesions in the liver suspicious for metastatic disease. EXAM: ULTRASOUND GUIDED CORE BIOPSY OF LIVER MEDICATIONS: None. ANESTHESIA/SEDATION: Fentanyl 50 mcg IV; Versed 1.0 mg IV Moderate Sedation Time:  16 minutes. The patient was continuously  monitored during the procedure by the interventional radiology nurse under my direct supervision. PROCEDURE: The procedure, risks, benefits, and alternatives were explained to the patient. Questions regarding the procedure were encouraged and answered. The patient understands and consents to the procedure. A time-out was performed prior to initiating the procedure. Ultrasound was utilized to localize a lesion within the left lobe of the liver. The anterior abdominal wall was prepped with chlorhexidine in a sterile fashion, and a sterile drape was applied covering the operative field. A sterile gown and sterile gloves were used for the procedure. Local anesthesia was provided with 1% Lidocaine. Under ultrasound guidance, a 67 gauge trocar needle was advanced to the level of a lesion within the posterior aspect of the left lobe of the liver. After confirming needle tip position, 3 separate coaxial 18 gauge core biopsy samples were obtained. Samples were submitted in formalin. A slurry of Gel-Foam pledgets mixed in sterile saline was then injected through the outer needle as the needle was retracted and removed. Additional ultrasound was performed. COMPLICATIONS: None immediate. FINDINGS: Well-circumscribed hypoechoic mass within the posterior aspect of the left lobe of the liver measures  approximately 3.7 cm in greatest diameter. Solid tissue was obtained. IMPRESSION: Ultrasound-guided core biopsy performed of a lesion within the left lobe of the liver measuring approximately 3.7 cm in greatest diameter. Electronically Signed   By: Aletta Edouard M.D.   On: 05/10/2019 12:39    PERFORMANCE STATUS (ECOG) : 1 - Symptomatic but completely ambulatory  Review of Systems Unless otherwise noted, a complete review of systems is negative.  Physical Exam General: NAD Pulmonary: Unlabored Extremities: no edema, no joint deformities Skin: no rashes Neurological: Nonfocal  IMPRESSION: Routine follow-up visit.  Patient was seen in the infusion area.  Patient denies significant changes or concerns.  He says that his pain is being well managed with as needed use of oxycodone.  However, he went a couple of days without taking pain medication because of concurrent constipation.  We discussed constipation management at length.  I recommended that he start daily Senokot.    Patient says that he is not keen on the idea of having to rely on pain medication.  We discussed possible option of referral to pain management for consideration of celiac plexus block but patient quickly declined this stating that he did not like procedures.  I suggested he keep a pain journal.  We could consider starting him on a long-acting opioid if needed.  PLAN: -Continue current scope of treatment -Continue oxycodone as needed for pain -Daily bowel regimen -ACP/MOST form previously reviewed -RTC 2-3 weeks   Patient expressed understanding and was in agreement with this plan. He also understands that He can call the clinic at any time with any questions, concerns, or complaints.     Time Total: 15 minutes  Visit consisted of counseling and education dealing with the complex and emotionally intense issues of symptom management and palliative care in the setting of serious and potentially life-threatening  illness.Greater than 50%  of this time was spent counseling and coordinating care related to the above assessment and plan.  Signed by: Altha Harm, PhD, NP-C

## 2019-05-31 NOTE — Progress Notes (Signed)
Hematology/Oncology Follow Up Note [facility]  Telephone:([phone] [phone]  Patient Care Team: Olin Hauser, DO as PCP - General (Family Medicine) Clent Jacks, RN as Oncology Nurse Navigator   Name of the patient: Kenneth Maxwell  [id number]  [birth year]   REASON FOR VISIT  follow-up for pancreatic cancer  PERTINENT ONCOLOGY HISTORY Harlan Mathes is a 64 y.o[place] who has above oncology history reviewed by me today presented for follow up visit for management of metastatic pancreatic cancer 64 y.o. male presents for management of pancreatic cancer. Patient has a remote history of RCC status post right nephrectomy in 11/23/2018. Anxiety, alcohol abuse, tobacco abuse. Patient was admitted from 04/26/2019-04/29/2019 due to painless jaundice, generalized weakness. Bilirubin was elevated to 10, right upper quadrant ultrasound showed 3.6 cm hypoechoic mass along the posterior margin of the left liver lobe.  Gallbladder sludge.  Dilated CBD. MRCP showed hypoenhancing mass of the pancreatic head measuring about 3.6 cm, obstructing CBD near the ampulla consistent with pancreatic adenocarcinoma.  Hypoenhancing metastasis of liver and cholelithiasis. Patient was seen by gastroenterology.  Had a ERCP on 04/28/2019 which revealed a duodenal mass, single localized biliary stricture in the lower third of the main bile duct and dilated entire main bile duct.  A biliary sphincterotomy was performed.  Biliary stent was placed into the common bile duct.  Biopsy was performed. Unfortunately biopsy was not diagnostic.  Patient's bilirubin trended down after procedure.  05/10/2019 he underwent ultrasound-guided left liver mass biopsy and the pathology showed metastatic adenocarcinoma, consistent with pancreati biliary origin.  Today patient was accompanied by his wife.  Patient has called earlier this week reporting abdominal pain which was not improved by  taking Tylenol or NSAIDs.  Patient was prescribed with oxycodone 5 mg every 6 hours as needed.  He reports pain is better controlled. He is constipated.  Last bowel movement 3 days ago.  # INTERVAL HISTORY 64 y.o. male presents for management of stage IV pancreatic cancer Status post cycle 1 modified FOLFIRINOX. Patient reports epigastric pain radiating to the back, " bandlike" distribution.  Also he has experienced some chest wall pain on day 2 of chemotherapy.  He is constipated.  He takes oxycodone 1-2 per day, he is afraid of oxycodone may cause more constipation. He got IV fluid/antiemetics on day 4 of treatments with pump removal.  Received on pro. Denies any mouth sore, fever, chills, diarrhea.   Review of Systems  Constitutional: Negative for appetite change, chills, fatigue, fever and unexpected weight change.  HENT:   Negative for hearing loss and voice change.   Eyes: Negative for eye problems and icterus.  Respiratory: Negative for chest tightness, cough and shortness of breath.   Cardiovascular: Negative for chest pain and leg swelling.  Gastrointestinal: Positive for abdominal pain and constipation. Negative for abdominal distention.  Endocrine: Negative for hot flashes.  Genitourinary: Negative for difficulty urinating, dysuria and frequency.   Musculoskeletal: Negative for arthralgias.  Skin: Negative for itching and rash.  Neurological: Negative for light-headedness and numbness.  Hematological: Negative for adenopathy. Does not bruise/bleed easily.  Psychiatric/Behavioral: Negative for confusion.      Allergies  Allergen Reactions  . Codeine Rash and Other (See Comments)     Past Medical History:  Diagnosis Date  . Alcohol abuse   . Arthritis   . Blood clotting disorder ([place])   . Chronic renal disease   . Encounter for antineoplastic chemotherapy 05/24/2019  . H/O urinary retention   .  Hematuria, gross   . Hypertension   . Renal cell carcinoma ([place])  11/2013   kidney cancer s/p R nephrectomy  . Right renal mass      Past Surgical History:  Procedure Laterality Date  . ERCP N/A 04/28/2019   Procedure: ENDOSCOPIC RETROGRADE CHOLANGIOPANCREATOGRAPHY (ERCP);  Surgeon: Lucilla Lame, MD;  Location: [facility] ENDOSCOPY;  Service: Endoscopy;  Laterality: N/A;  . kidney removed Right   . liver stent Right   . NEPHRECTOMY RADICAL Right 11/2013   R side kidney cancer surgery, non adrenal sparing  . PORTA CATH INSERTION N/A 05/20/2019   Procedure: PORTA CATH INSERTION;  Surgeon: Algernon Huxley, MD;  Location: [place] CV LAB;  Service: Cardiovascular;  Laterality: N/A;    Social History   Socioeconomic History  . Marital status: Married    Spouse name: Not on file  . Number of children: Not on file  . Years of education: Not on file  . Highest education level: Not on file  Occupational History  . Not on file  Tobacco Use  . Smoking status: Current Every Day Smoker    Packs/day: 1.00    Years: 50.00    Pack years: 50.00    Types: Cigarettes  . Smokeless tobacco: Current User  Substance and Sexual Activity  . Alcohol use: Not Currently    Alcohol/week: 49.0 standard drinks    Types: 49 Cans of beer per week    Comment: hasn't drank in over 1 month  . Drug use: No  . Sexual activity: Yes  Other Topics Concern  . Not on file  Social History Narrative  . Not on file   Social Determinants of Health   Financial Resource Strain:   . Difficulty of Paying Living Expenses:   Food Insecurity:   . Worried About Charity fundraiser in the Last Year:   . Arboriculturist in the Last Year:   Transportation Needs:   . Film/video editor (Medical):   Marland Kitchen Lack of Transportation (Non-Medical):   Physical Activity:   . Days of Exercise per Week:   . Minutes of Exercise per Session:   Stress:   . Feeling of Stress :   Social Connections:   . Frequency of Communication with Friends and Family:   . Frequency of Social Gatherings with  Friends and Family:   . Attends Religious Services:   . Active Member of Clubs or Organizations:   . Attends Archivist Meetings:   Marland Kitchen Marital Status:   Intimate Partner Violence:   . Fear of Current or Ex-Partner:   . Emotionally Abused:   Marland Kitchen Physically Abused:   . Sexually Abused:     Family History  Problem Relation Age of Onset  . Hypertension Mother   . Diabetes Mother   . Hypertension Father   . Heart attack Father      Current Outpatient Medications:  .  amLODipine (NORVASC) 10 MG tablet, Take 1 tablet (10 mg total) by mouth daily., Disp: 90 tablet, Rfl: 3 .  dexamethasone (DECADRON) 4 MG tablet, Take 2 tablets (8 mg total) by mouth daily. Start the day after chemotherapy for 3 days. Take with food., Disp: 8 tablet, Rfl: 5 .  lidocaine-prilocaine (EMLA) cream, Apply to affected area once, Disp: 30 g, Rfl: 3 .  losartan (COZAAR) 100 MG tablet, Take 1 tablet (100 mg total) by mouth daily., Disp: 90 tablet, Rfl: 3 .  Multiple Vitamin (MULTIVITAMIN WITH MINERALS) TABS tablet, Take  1 tablet by mouth daily., Disp:  , Rfl:  .  omeprazole (PRILOSEC) 20 MG capsule, Take 1 capsule (20 mg total) by mouth 2 (two) times daily before a meal., Disp: 60 capsule, Rfl: 0 .  oxyCODONE (ROXICODONE) 5 MG immediate release tablet, Take 1 tablet (5 mg total) by mouth every 6 (six) hours as needed for severe pain., Disp: 30 tablet, Rfl: 0 .  Polyethylene Glycol 3350 (MIRALAX PO), Take by mouth. BID, Disp: , Rfl:  .  prochlorperazine (COMPAZINE) 10 MG tablet, Take 1 tablet (10 mg total) by mouth every 6 (six) hours as needed (Nausea or vomiting)., Disp: 30 tablet, Rfl: 1 .  spironolactone (ALDACTONE) 25 MG tablet, Take 1 tablet (25 mg total) by mouth daily., Disp: 30 tablet, Rfl: 1 .  loperamide (IMODIUM A-D) 2 MG tablet, Take 2 at onset of diarrhea, then 1 every 2hrs until 12hr without a BM. May take 2 tab every 4hrs at bedtime. If diarrhea recurs repeat. (Patient not taking: Reported on  05/24/2019), Disp: 100 tablet, Rfl: 1 .  senna (SENOKOT) 8.6 MG TABS tablet, Take 2 tablets (17.2 mg total) by mouth daily., Disp: 120 tablet, Rfl: 0 No current facility-administered medications for this visit.  Facility-Administered Medications Ordered in Other Visits:  .  sodium chloride 0.9 % 1,000 mL with potassium chloride 20 mEq infusion, , Intravenous, Continuous, Earlie Server, MD, Stopped at 05/31/19 1543 .  sodium chloride flush (NS) 0.9 % injection 10 mL, 10 mL, Intravenous, PRN, Earlie Server, MD, 10 mL at 05/31/19 1314  Physical exam:  Vitals:   05/31/19 1327  BP: 104/70  Pulse: 91  Resp: 18  Temp: (!) 96.7 F (35.9 C)  Weight: 169 lb 9.6 oz (76.9 kg)   Physical Exam Constitutional:      General: He is not in acute distress. HENT:     Head: Normocephalic and atraumatic.  Eyes:     General: No scleral icterus. Cardiovascular:     Rate and Rhythm: Normal rate and regular rhythm.     Heart sounds: Normal heart sounds.  Pulmonary:     Effort: Pulmonary effort is normal. No respiratory distress.     Breath sounds: No wheezing.  Abdominal:     General: Bowel sounds are normal. There is no distension.     Palpations: Abdomen is soft.  Musculoskeletal:        General: No deformity. Normal range of motion.     Cervical back: Normal range of motion and neck supple.  Skin:    General: Skin is warm and dry.     Coloration: Skin is not jaundiced.     Findings: No erythema or rash.  Neurological:     Mental Status: He is alert and oriented to person, place, and time. Mental status is at baseline.     Cranial Nerves: No cranial nerve deficit.     Coordination: Coordination normal.  Psychiatric:        Mood and Affect: Mood normal.     CMP Latest Ref Rng & Units 05/31/2019  Glucose 70 - 99 mg/dL 126(H)  BUN 8 - 23 mg/dL 16  Creatinine 0.61 - 1.24 mg/dL 1.16  Sodium 135 - 145 mmol/L 131(L)  Potassium 3.5 - 5.1 mmol/L 3.3(L)  Chloride 98 - 111 mmol/L 99  CO2 22 - 32 mmol/L 22    Calcium 8.9 - 10.3 mg/dL 8.5(L)  Total Protein 6.5 - 8.1 g/dL 6.5  Total Bilirubin 0.3 - 1.2 mg/dL 1.2  Alkaline Phos  38 - 126 U/L 286(H)  AST 15 - 41 U/L 74(H)  ALT 0 - 44 U/L 107(H)   CBC Latest Ref Rng & Units 05/31/2019  WBC 4.0 - 10.5 K/uL 48.8(H)  Hemoglobin 13.0 - 17.0 g/dL 12.2(L)  Hematocrit 39.0 - 52.0 % 34.0(L)  Platelets 150 - 400 K/uL 321    RADIOGRAPHIC STUDIES: I have personally reviewed the radiological images as listed and agreed with the findings in the report. PERIPHERAL VASCULAR CATHETERIZATION  Result Date: 05/20/2019 See op note  NM PET Image Initial (PI) Skull Base To Thigh  Result Date: 05/07/2019 CLINICAL DATA:  Initial treatment strategy for pancreatic cancer. History of renal cell carcinoma in 2016 with nephrectomy. EXAM: NUCLEAR MEDICINE PET SKULL BASE TO THIGH TECHNIQUE: 9.4 mCi F-18 FDG was injected intravenously. Full-ring PET imaging was performed from the skull base to thigh after the radiotracer. CT data was obtained and used for attenuation correction and anatomic localization. Fasting blood glucose: 103 mg/dl COMPARISON:  04/29/2019 chest CT.  04/26/2019 abdominal MRI. FINDINGS: Mediastinal blood pool activity: SUV max 2.4 Liver activity: SUV max NA NECK: No areas of abnormal hypermetabolism. Incidental CT findings: Bilateral carotid atherosclerosis. No cervical adenopathy. CHEST: No pulmonary parenchymal or thoracic nodal hypermetabolism. Incidental CT findings: Deferred to recent diagnostic CT. Aortic and coronary artery atherosclerosis. Centrilobular and paraseptal emphysema. ABDOMEN/PELVIS: Segment 2 lesion measures 2.9 cm and a S.U.V. max of 9.4 on 151/2. Segment 5 lesion measures 2.0 cm and a S.U.V. max of 4.8 on 179/3. Pancreatic head mass measures on the order of 3.5 cm and a S.U.V. max of 4.8 on 178/3. A node adjacent the SMA measures 8 mm and a S.U.V. max of 4.0 on 181/3. Gastric cardia underdistention and hypermetabolism, including at a S.U.V. max  of 7.3 on 136/3. Incidental CT findings: Left adrenal thickening is low-density, suggesting hyperplasia are adenoma. Left renal vascular calcification. Right nephrectomy without local recurrence. Common duct stent in place. Small gallstone. Abdominal aortic atherosclerosis. SKELETON: No abnormal marrow activity. Incidental CT findings: Degenerative changes of both hips. IMPRESSION: 1. Hypermetabolic pancreatic primary with bilateral hepatic and peripancreatic nodal metastasis. 2. No evidence of hypermetabolic extraabdominal metastatic disease. 3. Right nephrectomy, without locally recurrent disease. 4. Proximal gastric hypermetabolism could be physiologic or represent gastritis. 5. Incidental findings, including: Aortic atherosclerosis (ICD10-I70.0), coronary artery atherosclerosis and emphysema (ICD10-J43.9). Cholelithiasis. Electronically Signed   By: Abigail Miyamoto M.D.   On: 05/07/2019 13:33   US BIOPSY (LIVER)  Result Date: 05/10/2019 INDICATION: Mass of the head of the pancreas with 2 lesions in the liver suspicious for metastatic disease. EXAM: ULTRASOUND GUIDED CORE BIOPSY OF LIVER MEDICATIONS: None. ANESTHESIA/SEDATION: Fentanyl 50 mcg IV; Versed 1.0 mg IV Moderate Sedation Time:  16 minutes. The patient was continuously monitored during the procedure by the interventional radiology nurse under my direct supervision. PROCEDURE: The procedure, risks, benefits, and alternatives were explained to the patient. Questions regarding the procedure were encouraged and answered. The patient understands and consents to the procedure. A time-out was performed prior to initiating the procedure. Ultrasound was utilized to localize a lesion within the left lobe of the liver. The anterior abdominal wall was prepped with chlorhexidine in a sterile fashion, and a sterile drape was applied covering the operative field. A sterile gown and sterile gloves were used for the procedure. Local anesthesia was provided with 1%  Lidocaine. Under ultrasound guidance, a 17 gauge trocar needle was advanced to the level of a lesion within the posterior aspect of the left lobe  of the liver. After confirming needle tip position, 3 separate coaxial 18 gauge core biopsy samples were obtained. Samples were submitted in formalin. A slurry of Gel-Foam pledgets mixed in sterile saline was then injected through the outer needle as the needle was retracted and removed. Additional ultrasound was performed. COMPLICATIONS: None immediate. FINDINGS: Well-circumscribed hypoechoic mass within the posterior aspect of the left lobe of the liver measures approximately 3.7 cm in greatest diameter. Solid tissue was obtained. IMPRESSION: Ultrasound-guided core biopsy performed of a lesion within the left lobe of the liver measuring approximately 3.7 cm in greatest diameter. Electronically Signed   By: Aletta Edouard M.D.   On: 05/10/2019 12:39     Assessment and plan Patient is a 64 y.o. 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 1. Pancreatic adenocarcinoma ([place])   2. Drug-induced constipation   3. Neoplasm related pain   4. Hyponatremia   5. Hypokalemia    #stage IV pancreatic cancer  Labs reviewed and discussed with patient. #Hyponatremia/hypokalemia, I will proceed with 1 L of IV fluid normal saline with 20 mEq potassium chloride.  #Obstructive jaundice, status post biliary stent. Bilirubin level completely normalized today.  #Constipation, likely secondary to narcotic use.   I will escalate his bowel regimen to Senokot 17.2 mg daily.  Prescription sent to pharmacy.  Continue MiraLAX daily as needed.  #Neoplasm related pain, continue oxycodone 5 mg every 6 hours as needed for severe pain.  Supportive care measures are necessary for patient well-being and will be provided as necessary. We spent sufficient  time to discuss many aspect of care, questions were answered to patient's satisfaction.    Follow up in 1 week.   Earlie Server, MD, PhD Hematology Oncology [facility] Pager- [phone] 05/31/2019

## 2019-05-31 NOTE — Progress Notes (Signed)
Patient reports before having his pump removed on Monday he was having chest pains Sunday night and Monday that did subside after pump was removed.  Has abdominal pain 4/10 pain.  The pain medication is causing him constipation.

## 2019-06-03 ENCOUNTER — Other Ambulatory Visit: Payer: Self-pay | Admitting: Oncology

## 2019-06-03 MED ORDER — OXYCODONE HCL 5 MG PO TABS: 5.0000 mg | ORAL_TABLET | Freq: Four times a day (QID) | ORAL | 0 refills | Status: DC | PRN

## 2019-06-04 ENCOUNTER — Other Ambulatory Visit: Payer: Self-pay | Admitting: Oncology

## 2019-06-04 MED ORDER — OXYCODONE HCL 5 MG PO TABS: 5.0000 mg | ORAL_TABLET | Freq: Four times a day (QID) | ORAL | 0 refills | Status: DC | PRN

## 2019-06-06 MED ORDER — SODIUM CHLORIDE 0.9% FLUSH
10.0000 mL | INTRAVENOUS | Status: DC | PRN
  Administered 2019-06-07: 10 mL via INTRAVENOUS
  Filled 2019-06-06: qty 10

## 2019-06-07 ENCOUNTER — Inpatient Hospital Stay: Payer: BC Managed Care – PPO

## 2019-06-07 ENCOUNTER — Other Ambulatory Visit: Payer: Self-pay

## 2019-06-07 ENCOUNTER — Encounter: Payer: Self-pay | Admitting: Oncology

## 2019-06-07 ENCOUNTER — Inpatient Hospital Stay: Payer: BC Managed Care – PPO | Admitting: Hospice and Palliative Medicine

## 2019-06-07 ENCOUNTER — Inpatient Hospital Stay: Payer: BC Managed Care – PPO | Attending: Oncology

## 2019-06-07 ENCOUNTER — Emergency Department
Admission: EM | Admit: 2019-06-07 | Discharge: 2019-06-08 | Disposition: A | Discharge status: Other Acute Inpatient Hospital | Payer: BC Managed Care – PPO | Source: Home / Self Care | Attending: Emergency Medicine | Admitting: Emergency Medicine

## 2019-06-07 ENCOUNTER — Inpatient Hospital Stay (HOSPITAL_BASED_OUTPATIENT_CLINIC_OR_DEPARTMENT_OTHER): Payer: BC Managed Care – PPO | Admitting: Oncology

## 2019-06-07 VITALS — BP 99/74 | HR 88 | Temp 97.9°F | Resp 18 | Wt 167.1 lb

## 2019-06-07 DIAGNOSIS — C259 Malignant neoplasm of pancreas, unspecified: Secondary | ICD-10-CM

## 2019-06-07 DIAGNOSIS — K5903 Drug induced constipation: Secondary | ICD-10-CM | POA: Insufficient documentation

## 2019-06-07 DIAGNOSIS — Z20822 Contact with and (suspected) exposure to covid-19: Secondary | ICD-10-CM | POA: Insufficient documentation

## 2019-06-07 DIAGNOSIS — Z1152 Encounter for screening for COVID-19: Secondary | ICD-10-CM | POA: Insufficient documentation

## 2019-06-07 DIAGNOSIS — Z85528 Personal history of other malignant neoplasm of kidney: Secondary | ICD-10-CM | POA: Diagnosis not present

## 2019-06-07 DIAGNOSIS — E871 Hypo-osmolality and hyponatremia: Secondary | ICD-10-CM | POA: Insufficient documentation

## 2019-06-07 DIAGNOSIS — F1010 Alcohol abuse, uncomplicated: Secondary | ICD-10-CM | POA: Diagnosis not present

## 2019-06-07 DIAGNOSIS — G893 Neoplasm related pain (acute) (chronic): Secondary | ICD-10-CM | POA: Diagnosis not present

## 2019-06-07 DIAGNOSIS — F17210 Nicotine dependence, cigarettes, uncomplicated: Secondary | ICD-10-CM | POA: Insufficient documentation

## 2019-06-07 DIAGNOSIS — Z79899 Other long term (current) drug therapy: Secondary | ICD-10-CM | POA: Insufficient documentation

## 2019-06-07 DIAGNOSIS — M1990 Unspecified osteoarthritis, unspecified site: Secondary | ICD-10-CM | POA: Diagnosis not present

## 2019-06-07 DIAGNOSIS — F419 Anxiety disorder, unspecified: Secondary | ICD-10-CM | POA: Insufficient documentation

## 2019-06-07 DIAGNOSIS — Z905 Acquired absence of kidney: Secondary | ICD-10-CM | POA: Diagnosis not present

## 2019-06-07 DIAGNOSIS — C787 Secondary malignant neoplasm of liver and intrahepatic bile duct: Secondary | ICD-10-CM | POA: Insufficient documentation

## 2019-06-07 DIAGNOSIS — Z95828 Presence of other vascular implants and grafts: Secondary | ICD-10-CM

## 2019-06-07 DIAGNOSIS — K831 Obstruction of bile duct: Secondary | ICD-10-CM

## 2019-06-07 DIAGNOSIS — C251 Malignant neoplasm of body of pancreas: Secondary | ICD-10-CM | POA: Insufficient documentation

## 2019-06-07 DIAGNOSIS — Z7952 Long term (current) use of systemic steroids: Secondary | ICD-10-CM | POA: Diagnosis not present

## 2019-06-07 DIAGNOSIS — Z515 Encounter for palliative care: Secondary | ICD-10-CM | POA: Diagnosis not present

## 2019-06-07 DIAGNOSIS — I10 Essential (primary) hypertension: Secondary | ICD-10-CM | POA: Insufficient documentation

## 2019-06-07 DIAGNOSIS — R531 Weakness: Secondary | ICD-10-CM | POA: Diagnosis not present

## 2019-06-07 DIAGNOSIS — I129 Hypertensive chronic kidney disease with stage 1 through stage 4 chronic kidney disease, or unspecified chronic kidney disease: Secondary | ICD-10-CM | POA: Insufficient documentation

## 2019-06-07 DIAGNOSIS — Z85520 Personal history of malignant carcinoid tumor of kidney: Secondary | ICD-10-CM | POA: Insufficient documentation

## 2019-06-07 DIAGNOSIS — N189 Chronic kidney disease, unspecified: Secondary | ICD-10-CM | POA: Diagnosis not present

## 2019-06-07 LAB — COMPREHENSIVE METABOLIC PANEL
ALT: 206 U/L — ABNORMAL HIGH (ref 0–44)
AST: 81 U/L — ABNORMAL HIGH (ref 15–41)
Albumin: 2.7 g/dL — ABNORMAL LOW (ref 3.5–5.0)
Alkaline Phosphatase: 848 U/L — ABNORMAL HIGH (ref 38–126)
Anion gap: 11 (ref 5–15)
BUN: 13 mg/dL (ref 8–23)
CO2: 22 mmol/L (ref 22–32)
Calcium: 8.6 mg/dL — ABNORMAL LOW (ref 8.9–10.3)
Chloride: 92 mmol/L — ABNORMAL LOW (ref 98–111)
Creatinine, Ser: 0.97 mg/dL (ref 0.61–1.24)
GFR calc Af Amer: >60 mL/min (ref >60)
GFR calc non Af Amer: >60 mL/min (ref >60)
Glucose, Bld: 112 mg/dL — ABNORMAL HIGH (ref 70–99)
Potassium: 3.2 mmol/L — ABNORMAL LOW (ref 3.5–5.1)
Sodium: 125 mmol/L — ABNORMAL LOW (ref 135–145)
Total Bilirubin: 10.4 mg/dL — ABNORMAL HIGH (ref 0.3–1.2)
Total Protein: 6.6 g/dL (ref 6.5–8.1)

## 2019-06-07 LAB — CBC WITH DIFFERENTIAL/PLATELET
Abs Immature Granulocytes: 1.39 10*3/uL — ABNORMAL HIGH (ref 0.00–0.07)
Basophils Absolute: 0.2 10*3/uL — ABNORMAL HIGH (ref 0.0–0.1)
Basophils Relative: 1 %
Eosinophils Absolute: 0.1 10*3/uL (ref 0.0–0.5)
Eosinophils Relative: 0 %
HCT: 32.6 % — ABNORMAL LOW (ref 39.0–52.0)
Hemoglobin: 11.7 g/dL — ABNORMAL LOW (ref 13.0–17.0)
Immature Granulocytes: 6 %
Lymphocytes Relative: 4 %
Lymphs Abs: 1 10*3/uL (ref 0.7–4.0)
MCH: 30.5 pg (ref 26.0–34.0)
MCHC: 35.9 g/dL (ref 30.0–36.0)
MCV: 85.1 fL (ref 80.0–100.0)
Monocytes Absolute: 2 10*3/uL — ABNORMAL HIGH (ref 0.1–1.0)
Monocytes Relative: 9 %
Neutro Abs: 18.9 10*3/uL — ABNORMAL HIGH (ref 1.7–7.7)
Neutrophils Relative %: 80 %
Platelets: 234 10*3/uL (ref 150–400)
RBC: 3.83 MIL/uL — ABNORMAL LOW (ref 4.22–5.81)
RDW: 14.6 % (ref 11.5–15.5)
WBC: 23.5 10*3/uL — ABNORMAL HIGH (ref 4.0–10.5)
nRBC: 0 % (ref 0.0–0.2)

## 2019-06-07 LAB — URINALYSIS, COMPLETE (UACMP) WITH MICROSCOPIC
Bacteria, UA: NONE SEEN
Glucose, UA: NEGATIVE mg/dL
Hgb urine dipstick: NEGATIVE
Ketones, ur: NEGATIVE mg/dL
Leukocytes,Ua: NEGATIVE
Nitrite: NEGATIVE
Protein, ur: 30 mg/dL — AB
Specific Gravity, Urine: 1.011 (ref 1.005–1.030)
pH: 6 (ref 5.0–8.0)

## 2019-06-07 LAB — LACTIC ACID, PLASMA: Lactic Acid, Venous: 0.8 mmol/L (ref 0.5–1.9)

## 2019-06-07 LAB — PROTIME-INR
INR: 1.2 (ref 0.8–1.2)
Prothrombin Time: 14.3 seconds (ref 11.4–15.2)

## 2019-06-07 LAB — SARS CORONAVIRUS 2 BY RT PCR (HOSPITAL ORDER, PERFORMED IN ~~LOC~~ HOSPITAL LAB): SARS Coronavirus 2: NEGATIVE

## 2019-06-07 MED ORDER — PANTOPRAZOLE SODIUM 40 MG PO TBEC: 40.0000 mg | DELAYED_RELEASE_TABLET | Freq: Every day | ORAL | Status: DC

## 2019-06-07 MED ORDER — PIPERACILLIN SOD-TAZOBACTAM SO 2.25 (2-0.25) G IV SOLR
4.5000 g | Freq: Once | INTRAVENOUS | Status: AC
  Administered 2019-06-07: 4.5 g via INTRAVENOUS
  Filled 2019-06-07: qty 20

## 2019-06-07 MED ORDER — SPIRONOLACTONE 25 MG PO TABS
25.0000 mg | ORAL_TABLET | Freq: Every day | ORAL | Status: DC
  Filled 2019-06-07: qty 1

## 2019-06-07 MED ORDER — PROCHLORPERAZINE MALEATE 10 MG PO TABS
10.0000 mg | ORAL_TABLET | Freq: Four times a day (QID) | ORAL | Status: DC | PRN
  Filled 2019-06-07: qty 1

## 2019-06-07 MED ORDER — LOSARTAN POTASSIUM 50 MG PO TABS: 100.0000 mg | ORAL_TABLET | Freq: Every day | ORAL | Status: DC

## 2019-06-07 MED ORDER — AMLODIPINE BESYLATE 5 MG PO TABS: 10.0000 mg | ORAL_TABLET | Freq: Every day | ORAL | Status: DC

## 2019-06-07 MED ORDER — MELATONIN 5 MG PO TABS
2.5000 mg | ORAL_TABLET | Freq: Every day | ORAL | Status: DC
  Administered 2019-06-07: 2.5 mg via ORAL
  Filled 2019-06-07 (×2): qty 0.5

## 2019-06-07 MED ORDER — OXYCODONE HCL 5 MG PO TABS
5.0000 mg | ORAL_TABLET | ORAL | Status: DC | PRN
  Administered 2019-06-07 – 2019-06-08 (×2): 5 mg via ORAL
  Filled 2019-06-07 (×2): qty 1

## 2019-06-07 MED ORDER — PIPERACILLIN-TAZOBACTAM 4.5 G IVPB: 4.5000 g | Freq: Once | INTRAVENOUS | Status: DC

## 2019-06-07 MED ORDER — ADULT MULTIVITAMIN W/MINERALS CH: 1.0000 | ORAL_TABLET | Freq: Every day | ORAL | Status: DC

## 2019-06-07 MED ORDER — SODIUM CHLORIDE 0.9 % IV BOLUS
1000.0000 mL | Freq: Once | INTRAVENOUS | Status: AC
  Administered 2019-06-07: 1000 mL via INTRAVENOUS

## 2019-06-07 MED ORDER — NICOTINE 21 MG/24HR TD PT24
21.0000 mg | MEDICATED_PATCH | Freq: Every day | TRANSDERMAL | Status: DC
  Administered 2019-06-07: 21 mg via TRANSDERMAL
  Filled 2019-06-07: qty 1

## 2019-06-07 NOTE — ED Triage Notes (Signed)
Pt sent form the cancer center for elevated bilirubin, states he went for chemotherapy and they think his liver stent has failed and is blocked. Pt states he has had increased abd pain today,.

## 2019-06-07 NOTE — ED Notes (Signed)
Pt very adamant on eating, Dr Cinda Quest made aware and food tray ordered.

## 2019-06-07 NOTE — ED Provider Notes (Signed)
[facility] Emergency Department Provider Note ____________________________________________   First MD Initiated Contact with Patient 06/07/19 [phone]     (approximate)  I have reviewed the triage vital signs and the nursing notes.   HISTORY  Chief Complaint Abnormal Lab    HPI Kenneth Maxwell is a 64 y.o. male with PMH as noted below as well as a history of pancreatic cancer who presents due to elevated bilirubin noted on labs earlier today unknown time of onset, and associated mainly with some generalized weakness.  Patient states that he has had some abdominal pain chronically, but it is not particularly worse in the last few days.  He states he feels he has no energy.  He also reports itching but this is chronic.  He denies vomiting or diarrhea, and has no shortness of breath.  The patient had an ERCP and biliary stent in April.  Per his oncologist, he will need an repeat ERCP because there is concern for recurrent biliary obstruction.  Past Medical History:  Diagnosis Date  . Alcohol abuse   . Arthritis   . Blood clotting disorder ([place])   . Chronic renal disease   . Encounter for antineoplastic chemotherapy 05/24/2019  . H/O urinary retention   . Hematuria, gross   . Hypertension   . Renal cell carcinoma ([place]) 11/2013   kidney cancer s/p R nephrectomy  . Right renal mass     Patient Active Problem List   Diagnosis Date Noted  . Neoplasm related pain 06/07/2019  . Hyponatremia 06/07/2019  . Goals of care, counseling/discussion 05/24/2019  . Encounter for antineoplastic chemotherapy 05/24/2019  . Neoplasm of digestive system   . Common bile duct dilatation   . Obstructive jaundice   . Pancreatic mass   . Hypokalemia 04/26/2019  . Abnormal LFTs 04/26/2019  . Leukocytosis 04/26/2019  . Diarrhea 04/26/2019  . Pancreatic adenocarcinoma ([place]) 04/26/2019  . Liver disease   . S/p nephrectomy 10/11/2016  . Arthritis 08/04/2014  . History of  renal cell carcinoma 08/04/2014  . H/O cardiovascular disorder 08/04/2014  . Compulsive tobacco user syndrome 08/04/2014  . Hypertension 06/25/2014  . Tobacco use 06/25/2014  . Alcohol abuse 06/25/2014    Past Surgical History:  Procedure Laterality Date  . ERCP N/A 04/28/2019   Procedure: ENDOSCOPIC RETROGRADE CHOLANGIOPANCREATOGRAPHY (ERCP);  Surgeon: Lucilla Lame, MD;  Location: [facility] ENDOSCOPY;  Service: Endoscopy;  Laterality: N/A;  . kidney removed Right   . liver stent Right   . NEPHRECTOMY RADICAL Right 11/2013   R side kidney cancer surgery, non adrenal sparing  . PORTA CATH INSERTION N/A 05/20/2019   Procedure: PORTA CATH INSERTION;  Surgeon: Algernon Huxley, MD;  Location: [place] CV LAB;  Service: Cardiovascular;  Laterality: N/A;    Prior to Admission medications   Medication Sig Start Date End Date Taking? Authorizing Provider  amLODipine (NORVASC) 10 MG tablet Take 1 tablet (10 mg total) by mouth daily. 09/13/18   Karamalegos, Devonne Doughty, DO  dexamethasone (DECADRON) 4 MG tablet Take 2 tablets (8 mg total) by mouth daily. Start the day after chemotherapy for 3 days. Take with food. 05/24/19   Earlie Server, MD  lidocaine-prilocaine (EMLA) cream Apply to affected area once 05/17/19   Earlie Server, MD  loperamide (IMODIUM A-D) 2 MG tablet Take 2 at onset of diarrhea, then 1 every 2hrs until 12hr without a BM. May take 2 tab every 4hrs at bedtime. If diarrhea recurs repeat. Patient not taking: Reported on 05/24/2019  05/17/19   Earlie Server, MD  losartan (COZAAR) 100 MG tablet Take 1 tablet (100 mg total) by mouth daily. 09/13/18   Karamalegos, Devonne Doughty, DO  Multiple Vitamin (MULTIVITAMIN WITH MINERALS) TABS tablet Take 1 tablet by mouth daily. 04/30/19   Mercy Riding, MD  omeprazole (PRILOSEC) 20 MG capsule Take 1 capsule (20 mg total) by mouth 2 (two) times daily before a meal. 05/27/19   Earlie Server, MD  oxyCODONE (ROXICODONE) 5 MG immediate release tablet Take 1 tablet (5 mg total) by mouth  every 6 (six) hours as needed for severe pain. 06/06/19   Earlie Server, MD  Polyethylene Glycol 3350 (MIRALAX PO) Take by mouth. BID    [provider]  prochlorperazine (COMPAZINE) 10 MG tablet Take 1 tablet (10 mg total) by mouth every 6 (six) hours as needed (Nausea or vomiting). Patient not taking: Reported on 06/07/2019 05/17/19   Earlie Server, MD  senna (SENOKOT) 8.6 MG TABS tablet Take 2 tablets (17.2 mg total) by mouth daily. Patient not taking: Reported on 06/07/2019 05/31/19   Earlie Server, MD  spironolactone (ALDACTONE) 25 MG tablet Take 1 tablet (25 mg total) by mouth daily. 04/30/19   Mercy Riding, MD    Allergies Codeine  Family History  Problem Relation Age of Onset  . Hypertension Mother   . Diabetes Mother   . Hypertension Father   . Heart attack Father     Social History Social History   Tobacco Use  . Smoking status: Current Every Day Smoker    Packs/day: 1.00    Years: 50.00    Pack years: 50.00    Types: Cigarettes  . Smokeless tobacco: Current User  Substance Use Topics  . Alcohol use: Not Currently    Alcohol/week: 49.0 standard drinks    Types: 49 Cans of beer per week    Comment: hasn't drank in over 1 month  . Drug use: No    Review of Systems  Constitutional: No fever/chills.  Positive for weakness. Eyes: No redness. ENT: No sore throat. Cardiovascular: Denies chest pain. Respiratory: Denies shortness of breath. Gastrointestinal: No vomiting or diarrhea.  Genitourinary: Negative for dysuria.  Musculoskeletal: Negative for back pain. Skin: Negative for rash. Neurological: Negative for headache.   ____________________________________________   PHYSICAL EXAM:  VITAL SIGNS: ED Triage Vitals  Enc Vitals Group     BP 06/07/19 0907 (!) 90/57     Pulse Rate 06/07/19 0907 86     Resp 06/07/19 0907 16     Temp 06/07/19 0907 97.6 F (36.4 C)     Temp Source 06/07/19 0907 Oral     SpO2 06/07/19 0907 100 %     Weight 06/07/19 0906 167 lb 1.6 oz  (75.8 kg)     Height 06/07/19 0906 5\' 11"  (1.803 m)     Head Circumference --      Peak Flow --      Pain Score 06/07/19 0909 4     Pain Loc --      Pain Edu? --      Excl. in [place]? --     Constitutional: Alert and oriented.  Relatively well appearing and in no acute distress. Eyes: Conjunctivae are normal.  Mild scleral icterus. Head: Atraumatic. Nose: No congestion/rhinnorhea. Mouth/Throat: Mucous membranes are moist.   Neck: Normal range of motion.  Cardiovascular: Normal rate, regular rhythm.  Good peripheral circulation. Respiratory: Normal respiratory effort.  No retractions. Gastrointestinal: Soft and nontender. No distention.  Genitourinary: No  flank tenderness. Musculoskeletal: Extremities warm and well perfused.  Neurologic:  Normal speech and language. No gross focal neurologic deficits are appreciated.  Skin:  Skin is warm and dry. No rash noted. Psychiatric: Mood and affect are normal. Speech and behavior are normal.  ____________________________________________   LABS (all labs ordered are listed, but only abnormal results are displayed)  Labs Reviewed  URINALYSIS, COMPLETE (UACMP) WITH MICROSCOPIC - Abnormal; Notable for the following components:      Result Value   Color, Urine AMBER (*)    APPearance CLEAR (*)    Bilirubin Urine MODERATE (*)    Protein, ur 30 (*)    All other components within normal limits  SARS CORONAVIRUS 2 BY RT PCR (HOSPITAL ORDER, [place] [facility])  LACTIC ACID, PLASMA  PROTIME-INR   ____________________________________________  EKG   ____________________________________________  RADIOLOGY    ____________________________________________   PROCEDURES  Procedure(s) performed: No  Procedures  Critical Care performed: No ____________________________________________   INITIAL IMPRESSION / ASSESSMENT AND PLAN / ED COURSE  Pertinent labs & imaging results that were available during my care of the  patient were reviewed by me and considered in my medical decision making (see chart for details).  64 year old male with PMH as noted above including a history of pancreatic cancer presents from the cancer center due to elevated bilirubin noted on labs today.  I reviewed the past medical records in epic.  The patient had an ERCP in April due to elevated bilirubin, and a biliary obstruction was found and stented.  He was sent to the ED by his oncologist Dr. Tasia Catchings because of concern that he is having recurrent biliary obstruction and will need an ERCP.  On exam the patient is overall well-appearing.  The patient is slightly hypotensive, but he states that this has been his recent baseline.  He is on antihypertensives and states he took them today.  The physical exam is otherwise unremarkable except for mild scleral icterus.  The abdomen is soft and nontender.  Labs earlier today reveal a total bilirubin of 10.4 as well as an alkaline phosphatase of 848.  Patient is also somewhat hyponatremic.  WBC count is elevated but trending down from a week ago.  Overall I suspect recurrent biliary obstruction.  We will obtain additional labs including coags, lactate, and urinalysis, and we will give a fluid bolus.  Per Dr. Tasia Catchings, Dr. Allen Norris is not available for ERCP so I will have to contact GI at [facility] for transfer there.  ----------------------------------------- 12:23 PM on 06/07/2019 -----------------------------------------  I discussed the case with Dr. Cristina Gong from GI at [facility].  He advised that while he would be happy to consult on the patient, given the large number of transfers [facility] receives on the weekend for ERCP and the length of the procedure, it is likely that a more stable patient may need to wait for the procedure so it may not make sense to transfer him.  However he advised that I could discuss with the hospitalist to still admit the patient there if needed.  I then discussed the case with Dr.  Bonna Gains from GI here.  After reviewing the clinical information, she recommended that the patient indeed be transferred as he does have an elevated WBC count and may need ERCP more urgently than Monday.  I then contacted the hospitalist Dr. Francine Graven at [facility] who accepted the patient for transfer.  The patient is stable for transfer at this time, and agrees  with the plan.  ----------------------------------------- 4:12 PM on 06/07/2019 -----------------------------------------  Patient is still awaiting transfer to [facility].  He remains clinically stable.  I started on empiric Zosyn.  I signed him out to the oncoming ED physician Dr. Cinda Quest.  ____________________________________________   FINAL CLINICAL IMPRESSION(S) / ED DIAGNOSES  Final diagnoses:  Biliary obstruction      NEW MEDICATIONS STARTED DURING THIS VISIT:  New Prescriptions   No medications on file     Note:  This document was prepared using Dragon voice recognition software and may include unintentional dictation errors.    Arta Silence, MD 06/07/19 [phone]

## 2019-06-07 NOTE — ED Notes (Signed)
Called Kenneth Maxwell, spoke with Phil to check on bed assignment.  Pt still waiting

## 2019-06-07 NOTE — ED Notes (Signed)
Wife called and with pts permission given update. Wifes number is [phone]

## 2019-06-07 NOTE — Progress Notes (Signed)
Hematology/Oncology Follow Up Note [facility]  [phone] [phone]  Patient Care Team: Olin Hauser, DO as PCP - General (Family Medicine) Clent Jacks, RN as Oncology Nurse Navigator   Name of the patient: Kenneth Maxwell  [id number]  [birth year]   REASON FOR VISIT  follow-up for pancreatic cancer  PERTINENT ONCOLOGY HISTORY Kenneth Maxwell is a 64 y.o[place] who has above oncology history reviewed by me today presented for follow up visit for management of metastatic pancreatic cancer 64 y.o. male presents for management of pancreatic cancer. Patient has a remote history of RCC status post right nephrectomy in 11/23/2018. Anxiety, alcohol abuse, tobacco abuse. Patient was admitted from 04/26/2019-04/29/2019 due to painless jaundice, generalized weakness. Bilirubin was elevated to 10, right upper quadrant ultrasound showed 3.6 cm hypoechoic mass along the posterior margin of the left liver lobe.  Gallbladder sludge.  Dilated CBD. MRCP showed hypoenhancing mass of the pancreatic head measuring about 3.6 cm, obstructing CBD near the ampulla consistent with pancreatic adenocarcinoma.  Hypoenhancing metastasis of liver and cholelithiasis. Patient was seen by gastroenterology.  Had a ERCP on 04/28/2019 which revealed a duodenal mass, single localized biliary stricture in the lower third of the main bile duct and dilated entire main bile duct.  A biliary sphincterotomy was performed.  Biliary stent was placed into the common bile duct.  Biopsy was performed. Unfortunately biopsy was not diagnostic.  Patient's bilirubin trended down after procedure.  05/10/2019 he underwent ultrasound-guided left liver mass biopsy and the pathology showed metastatic adenocarcinoma, consistent with pancreati biliary origin.  Today patient was accompanied by his wife.  Patient has called earlier this week reporting abdominal pain which was not improved by  taking Tylenol or NSAIDs.  Patient was prescribed with oxycodone 5 mg every 6 hours as needed.  He reports pain is better controlled. He is constipated.  Last bowel movement 3 days ago.  # INTERVAL HISTORY 64 y.o. male presents for management of stage IV pancreatic cancer Status post cycle 1 modified FOLFIRINOX. Patient reports not feeling well today.  BP is low in the clinic, 99/74.  He has no appetite, "nothing has a taste".    Review of Systems  Constitutional: Positive for appetite change and fatigue. Negative for chills and fever.  HENT:   Negative for hearing loss and voice change.   Eyes: Negative for eye problems and icterus.  Respiratory: Negative for chest tightness, cough and shortness of breath.   Cardiovascular: Negative for chest pain and leg swelling.  Gastrointestinal: Positive for abdominal pain and constipation. Negative for abdominal distention.  Endocrine: Negative for hot flashes.  Genitourinary: Negative for difficulty urinating, dysuria and frequency.   Musculoskeletal: Negative for arthralgias.  Skin: Negative for itching and rash.  Neurological: Negative for light-headedness and numbness.  Hematological: Negative for adenopathy. Does not bruise/bleed easily.  Psychiatric/Behavioral: Negative for confusion.      Allergies  Allergen Reactions   Codeine Rash and Other (See Comments)     Past Medical History:  Diagnosis Date   Alcohol abuse    Arthritis    Blood clotting disorder ([place])    Chronic renal disease    Encounter for antineoplastic chemotherapy 05/24/2019   H/O urinary retention    Hematuria, gross    Hypertension    Renal cell carcinoma ([place]) 11/2013   kidney cancer s/p R nephrectomy   Right renal mass      Past Surgical History:  Procedure Laterality Date   ERCP  N/A 04/28/2019   Procedure: ENDOSCOPIC RETROGRADE CHOLANGIOPANCREATOGRAPHY (ERCP);  Surgeon: Lucilla Lame, MD;  Location: [facility] ENDOSCOPY;  Service: Endoscopy;   Laterality: N/A;   kidney removed Right    liver stent Right    NEPHRECTOMY RADICAL Right 11/2013   R side kidney cancer surgery, non adrenal sparing   PORTA CATH INSERTION N/A 05/20/2019   Procedure: PORTA CATH INSERTION;  Surgeon: Algernon Huxley, MD;  Location: [place] CV LAB;  Service: Cardiovascular;  Laterality: N/A;    Social History   Socioeconomic History   Marital status: Married    Spouse name: Not on file   Number of children: Not on file   Years of education: Not on file   Highest education level: Not on file  Occupational History   Not on file  Tobacco Use   Smoking status: Current Every Day Smoker    Packs/day: 1.00    Years: 50.00    Pack years: 50.00    Types: Cigarettes   Smokeless tobacco: Current User  Substance and Sexual Activity   Alcohol use: Not Currently    Alcohol/week: 49.0 standard drinks    Types: 49 Cans of beer per week    Comment: hasn't drank in over 1 month   Drug use: No   Sexual activity: Yes  Other Topics Concern   Not on file  Social History Narrative   Not on file   Social Determinants of Health   Financial Resource Strain:    Difficulty of Paying Living Expenses:   Food Insecurity:    Worried About Charity fundraiser in the Last Year:    Arboriculturist in the Last Year:   Transportation Needs:    Film/video editor (Medical):    Lack of Transportation (Non-Medical):   Physical Activity:    Days of Exercise per Week:    Minutes of Exercise per Session:   Stress:    Feeling of Stress :   Social Connections:    Frequency of Communication with Friends and Family:    Frequency of Social Gatherings with Friends and Family:    Attends Religious Services:    Active Member of Clubs or Organizations:    Attends Music therapist:    Marital Status:   Intimate Partner Violence:    Fear of Current or Ex-Partner:    Emotionally Abused:    Physically Abused:    Sexually  Abused:     Family History  Problem Relation Age of Onset   Hypertension Mother    Diabetes Mother    Hypertension Father    Heart attack Father      Current Outpatient Medications:    amLODipine (NORVASC) 10 MG tablet, Take 1 tablet (10 mg total) by mouth daily., Disp: 90 tablet, Rfl: 3   dexamethasone (DECADRON) 4 MG tablet, Take 2 tablets (8 mg total) by mouth daily. Start the day after chemotherapy for 3 days. Take with food., Disp: 8 tablet, Rfl: 5   lidocaine-prilocaine (EMLA) cream, Apply to affected area once, Disp: 30 g, Rfl: 3   losartan (COZAAR) 100 MG tablet, Take 1 tablet (100 mg total) by mouth daily., Disp: 90 tablet, Rfl: 3   Multiple Vitamin (MULTIVITAMIN WITH MINERALS) TABS tablet, Take 1 tablet by mouth daily., Disp:  , Rfl:    omeprazole (PRILOSEC) 20 MG capsule, Take 1 capsule (20 mg total) by mouth 2 (two) times daily before a meal., Disp: 60 capsule, Rfl: 0   oxyCODONE (ROXICODONE)  5 MG immediate release tablet, Take 1 tablet (5 mg total) by mouth every 6 (six) hours as needed for severe pain., Disp: 30 tablet, Rfl: 0   spironolactone (ALDACTONE) 25 MG tablet, Take 1 tablet (25 mg total) by mouth daily., Disp: 30 tablet, Rfl: 1   loperamide (IMODIUM A-D) 2 MG tablet, Take 2 at onset of diarrhea, then 1 every 2hrs until 12hr without a BM. May take 2 tab every 4hrs at bedtime. If diarrhea recurs repeat. (Patient not taking: Reported on 05/24/2019), Disp: 100 tablet, Rfl: 1   Polyethylene Glycol 3350 (MIRALAX PO), Take by mouth. BID, Disp: , Rfl:    prochlorperazine (COMPAZINE) 10 MG tablet, Take 1 tablet (10 mg total) by mouth every 6 (six) hours as needed (Nausea or vomiting). (Patient not taking: Reported on 06/07/2019), Disp: 30 tablet, Rfl: 1   senna (SENOKOT) 8.6 MG TABS tablet, Take 2 tablets (17.2 mg total) by mouth daily. (Patient not taking: Reported on 06/07/2019), Disp: 120 tablet, Rfl: 0  Physical exam:  Vitals:   06/07/19 0845  BP: 99/74    Pulse: 88  Resp: 18  Temp: 97.9 F (36.6 C)  TempSrc: Tympanic  SpO2: 100%  Weight: 167 lb 1.6 oz (75.8 kg)   Physical Exam Constitutional:      General: He is not in acute distress. HENT:     Head: Normocephalic and atraumatic.  Eyes:     General: Scleral icterus present.  Cardiovascular:     Rate and Rhythm: Normal rate and regular rhythm.     Heart sounds: Normal heart sounds.  Pulmonary:     Effort: Pulmonary effort is normal. No respiratory distress.     Breath sounds: No wheezing.  Abdominal:     General: Bowel sounds are normal. There is no distension.     Palpations: Abdomen is soft.  Musculoskeletal:        General: No deformity. Normal range of motion.     Cervical back: Normal range of motion and neck supple.  Skin:    General: Skin is warm and dry.     Coloration: Skin is jaundiced.     Findings: No erythema or rash.  Neurological:     Mental Status: He is alert and oriented to person, place, and time. Mental status is at baseline.     Cranial Nerves: No cranial nerve deficit.     Coordination: Coordination normal.  Psychiatric:        Mood and Affect: Mood normal.     CMP Latest Ref Rng & Units 06/07/2019  Glucose 70 - 99 mg/dL 112(H)  BUN 8 - 23 mg/dL 13  Creatinine 0.61 - 1.24 mg/dL 0.97  Sodium 135 - 145 mmol/L 125(L)  Potassium 3.5 - 5.1 mmol/L 3.2(L)  Chloride 98 - 111 mmol/L 92(L)  CO2 22 - 32 mmol/L 22  Calcium 8.9 - 10.3 mg/dL 8.6(L)  Total Protein 6.5 - 8.1 g/dL 6.6  Total Bilirubin 0.3 - 1.2 mg/dL 10.4(H)  Alkaline Phos 38 - 126 U/L 848(H)  AST 15 - 41 U/L 81(H)  ALT 0 - 44 U/L 206(H)   CBC Latest Ref Rng & Units 06/07/2019  WBC 4.0 - 10.5 K/uL 23.5(H)  Hemoglobin 13.0 - 17.0 g/dL 11.7(L)  Hematocrit 39.0 - 52.0 % 32.6(L)  Platelets 150 - 400 K/uL 234    RADIOGRAPHIC STUDIES: I have personally reviewed the radiological images as listed and agreed with the findings in the report. PERIPHERAL VASCULAR CATHETERIZATION  Result Date:  05/20/2019 See  op note  US BIOPSY (LIVER)  Result Date: 05/10/2019 INDICATION: Mass of the head of the pancreas with 2 lesions in the liver suspicious for metastatic disease. EXAM: ULTRASOUND GUIDED CORE BIOPSY OF LIVER MEDICATIONS: None. ANESTHESIA/SEDATION: Fentanyl 50 mcg IV; Versed 1.0 mg IV Moderate Sedation Time:  16 minutes. The patient was continuously monitored during the procedure by the interventional radiology nurse under my direct supervision. PROCEDURE: The procedure, risks, benefits, and alternatives were explained to the patient. Questions regarding the procedure were encouraged and answered. The patient understands and consents to the procedure. A time-out was performed prior to initiating the procedure. Ultrasound was utilized to localize a lesion within the left lobe of the liver. The anterior abdominal wall was prepped with chlorhexidine in a sterile fashion, and a sterile drape was applied covering the operative field. A sterile gown and sterile gloves were used for the procedure. Local anesthesia was provided with 1% Lidocaine. Under ultrasound guidance, a 58 gauge trocar needle was advanced to the level of a lesion within the posterior aspect of the left lobe of the liver. After confirming needle tip position, 3 separate coaxial 18 gauge core biopsy samples were obtained. Samples were submitted in formalin. A slurry of Gel-Foam pledgets mixed in sterile saline was then injected through the outer needle as the needle was retracted and removed. Additional ultrasound was performed. COMPLICATIONS: None immediate. FINDINGS: Well-circumscribed hypoechoic mass within the posterior aspect of the left lobe of the liver measures approximately 3.7 cm in greatest diameter. Solid tissue was obtained. IMPRESSION: Ultrasound-guided core biopsy performed of a lesion within the left lobe of the liver measuring approximately 3.7 cm in greatest diameter. Electronically Signed   By: Aletta Edouard M.D.   On:  05/10/2019 12:39     Assessment and plan Patient is a 64 y.o. 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 1. Pancreatic adenocarcinoma ([place])   2. Obstructive jaundice   3. Neoplasm related pain   4. Hyponatremia    #stage IV pancreatic cancer  #Obstructive jaundice, status post biliary stent. Labs are reviewed and discussed with patient. Sudden increase of bilirubin to 10.2, suspect malfunction of his stent.  Hold chemotherapy due to hyperbilirubinemia.  Recommend patient to go to ER for further evaluation.  I also communicated with Dr.Wohl who recommends ERCP. Dr.Wohl is not available to do procedure today, recommend patient to go to [facility].  We called patient and left message. He is already checked into [facility] ER.  I talked to ER Triage RN and updated them.  Patient is to be transferred to [facility] for ERCP.   Neoplasm related pain, continue current regimen.  Continue bowel regimen for narcotic induced constipation.  We spent sufficient time to discuss many aspect of care, questions were answered to patient's satisfaction. Follow up  To be determined.   Earlie Server, MD, PhD Hematology Oncology [facility] at [facility] Pager- [phone] 06/07/2019

## 2019-06-07 NOTE — ED Provider Notes (Signed)
Patient insists on eating and I understand that it may be days before he has his ERCP therefore I will let him eat something.   Nena Polio, MD 06/07/19 [phone]

## 2019-06-07 NOTE — ED Notes (Signed)
Called [facility], spoke with Ruby; pt still waiting on bed

## 2019-06-07 NOTE — Progress Notes (Signed)
Pt in for follow up, concerned that he's not feeling well because BP is too low.  Pt wanting to stop BP meds.  Pt states zero appetite states "nothing has a taste".

## 2019-06-07 NOTE — ED Notes (Signed)
Pt states history of pancreatic and liver cancer, coming in with possible stent problems to the liver. Pt denies pain.

## 2019-06-08 ENCOUNTER — Encounter (HOSPITAL_COMMUNITY): Payer: Self-pay | Admitting: Internal Medicine

## 2019-06-08 ENCOUNTER — Inpatient Hospital Stay (HOSPITAL_COMMUNITY): Payer: BC Managed Care – PPO

## 2019-06-08 ENCOUNTER — Inpatient Hospital Stay (HOSPITAL_COMMUNITY)
Admission: AD | Admit: 2019-06-08 | Discharge: 2019-06-09 | DRG: 445 | Discharge status: Against Medical Advice | Payer: BC Managed Care – PPO | Source: Other Acute Inpatient Hospital | Attending: Internal Medicine | Admitting: Internal Medicine

## 2019-06-08 DIAGNOSIS — F17210 Nicotine dependence, cigarettes, uncomplicated: Secondary | ICD-10-CM | POA: Diagnosis not present

## 2019-06-08 DIAGNOSIS — C259 Malignant neoplasm of pancreas, unspecified: Secondary | ICD-10-CM | POA: Diagnosis present

## 2019-06-08 DIAGNOSIS — I10 Essential (primary) hypertension: Secondary | ICD-10-CM | POA: Diagnosis present

## 2019-06-08 DIAGNOSIS — F1010 Alcohol abuse, uncomplicated: Secondary | ICD-10-CM | POA: Diagnosis not present

## 2019-06-08 DIAGNOSIS — N189 Chronic kidney disease, unspecified: Secondary | ICD-10-CM | POA: Diagnosis present

## 2019-06-08 DIAGNOSIS — Z8249 Family history of ischemic heart disease and other diseases of the circulatory system: Secondary | ICD-10-CM | POA: Diagnosis not present

## 2019-06-08 DIAGNOSIS — E876 Hypokalemia: Secondary | ICD-10-CM | POA: Diagnosis present

## 2019-06-08 DIAGNOSIS — I129 Hypertensive chronic kidney disease with stage 1 through stage 4 chronic kidney disease, or unspecified chronic kidney disease: Secondary | ICD-10-CM | POA: Diagnosis present

## 2019-06-08 DIAGNOSIS — Z833 Family history of diabetes mellitus: Secondary | ICD-10-CM | POA: Diagnosis not present

## 2019-06-08 DIAGNOSIS — Z79899 Other long term (current) drug therapy: Secondary | ICD-10-CM

## 2019-06-08 DIAGNOSIS — Z905 Acquired absence of kidney: Secondary | ICD-10-CM

## 2019-06-08 DIAGNOSIS — Z85528 Personal history of other malignant neoplasm of kidney: Secondary | ICD-10-CM

## 2019-06-08 DIAGNOSIS — G893 Neoplasm related pain (acute) (chronic): Secondary | ICD-10-CM | POA: Diagnosis not present

## 2019-06-08 DIAGNOSIS — K5903 Drug induced constipation: Secondary | ICD-10-CM | POA: Diagnosis not present

## 2019-06-08 DIAGNOSIS — K831 Obstruction of bile duct: Secondary | ICD-10-CM | POA: Diagnosis not present

## 2019-06-08 DIAGNOSIS — Z5329 Procedure and treatment not carried out because of patient's decision for other reasons: Secondary | ICD-10-CM | POA: Diagnosis not present

## 2019-06-08 DIAGNOSIS — Z1152 Encounter for screening for COVID-19: Secondary | ICD-10-CM | POA: Diagnosis not present

## 2019-06-08 DIAGNOSIS — F419 Anxiety disorder, unspecified: Secondary | ICD-10-CM | POA: Diagnosis not present

## 2019-06-08 DIAGNOSIS — R748 Abnormal levels of other serum enzymes: Secondary | ICD-10-CM | POA: Diagnosis not present

## 2019-06-08 DIAGNOSIS — K8021 Calculus of gallbladder without cholecystitis with obstruction: Principal | ICD-10-CM | POA: Diagnosis present

## 2019-06-08 DIAGNOSIS — F17200 Nicotine dependence, unspecified, uncomplicated: Secondary | ICD-10-CM | POA: Diagnosis present

## 2019-06-08 DIAGNOSIS — Z7952 Long term (current) use of systemic steroids: Secondary | ICD-10-CM | POA: Diagnosis not present

## 2019-06-08 DIAGNOSIS — R109 Unspecified abdominal pain: Secondary | ICD-10-CM | POA: Diagnosis not present

## 2019-06-08 DIAGNOSIS — E871 Hypo-osmolality and hyponatremia: Secondary | ICD-10-CM | POA: Diagnosis not present

## 2019-06-08 DIAGNOSIS — Z20822 Contact with and (suspected) exposure to covid-19: Secondary | ICD-10-CM | POA: Diagnosis present

## 2019-06-08 DIAGNOSIS — M1990 Unspecified osteoarthritis, unspecified site: Secondary | ICD-10-CM | POA: Diagnosis not present

## 2019-06-08 DIAGNOSIS — C801 Malignant (primary) neoplasm, unspecified: Secondary | ICD-10-CM | POA: Diagnosis not present

## 2019-06-08 DIAGNOSIS — Z515 Encounter for palliative care: Secondary | ICD-10-CM | POA: Diagnosis not present

## 2019-06-08 DIAGNOSIS — C787 Secondary malignant neoplasm of liver and intrahepatic bile duct: Secondary | ICD-10-CM | POA: Diagnosis not present

## 2019-06-08 DIAGNOSIS — R932 Abnormal findings on diagnostic imaging of liver and biliary tract: Secondary | ICD-10-CM | POA: Diagnosis not present

## 2019-06-08 LAB — CBC WITH DIFFERENTIAL/PLATELET
Abs Immature Granulocytes: 0.8 10*3/uL — ABNORMAL HIGH (ref 0.00–0.07)
Basophils Absolute: 0.1 10*3/uL (ref 0.0–0.1)
Basophils Relative: 1 %
Eosinophils Absolute: 0 10*3/uL (ref 0.0–0.5)
Eosinophils Relative: 0 %
HCT: 32.4 % — ABNORMAL LOW (ref 39.0–52.0)
Hemoglobin: 11.4 g/dL — ABNORMAL LOW (ref 13.0–17.0)
Immature Granulocytes: 5 %
Lymphocytes Relative: 5 %
Lymphs Abs: 0.9 10*3/uL (ref 0.7–4.0)
MCH: 30.6 pg (ref 26.0–34.0)
MCHC: 35.2 g/dL (ref 30.0–36.0)
MCV: 87.1 fL (ref 80.0–100.0)
Monocytes Absolute: 1.4 10*3/uL — ABNORMAL HIGH (ref 0.1–1.0)
Monocytes Relative: 8 %
Neutro Abs: 14.1 10*3/uL — ABNORMAL HIGH (ref 1.7–7.7)
Neutrophils Relative %: 81 %
Platelets: 255 10*3/uL (ref 150–400)
RBC: 3.72 MIL/uL — ABNORMAL LOW (ref 4.22–5.81)
RDW: 15.2 % (ref 11.5–15.5)
WBC: 17.3 10*3/uL — ABNORMAL HIGH (ref 4.0–10.5)
nRBC: 0 % (ref 0.0–0.2)

## 2019-06-08 LAB — COMPREHENSIVE METABOLIC PANEL
ALT: 240 U/L — ABNORMAL HIGH (ref 0–44)
AST: 119 U/L — ABNORMAL HIGH (ref 15–41)
Albumin: 2.1 g/dL — ABNORMAL LOW (ref 3.5–5.0)
Alkaline Phosphatase: 1033 U/L — ABNORMAL HIGH (ref 38–126)
Anion gap: 13 (ref 5–15)
BUN: 6 mg/dL — ABNORMAL LOW (ref 8–23)
CO2: 21 mmol/L — ABNORMAL LOW (ref 22–32)
Calcium: 8.8 mg/dL — ABNORMAL LOW (ref 8.9–10.3)
Chloride: 94 mmol/L — ABNORMAL LOW (ref 98–111)
Creatinine, Ser: 0.83 mg/dL (ref 0.61–1.24)
GFR calc Af Amer: >60 mL/min (ref >60)
GFR calc non Af Amer: >60 mL/min (ref >60)
Glucose, Bld: 126 mg/dL — ABNORMAL HIGH (ref 70–99)
Potassium: 3.5 mmol/L (ref 3.5–5.1)
Sodium: 128 mmol/L — ABNORMAL LOW (ref 135–145)
Total Bilirubin: 12.7 mg/dL — ABNORMAL HIGH (ref 0.3–1.2)
Total Protein: 6.2 g/dL — ABNORMAL LOW (ref 6.5–8.1)

## 2019-06-08 MED ORDER — SPIRONOLACTONE 25 MG PO TABS
25.0000 mg | ORAL_TABLET | Freq: Every day | ORAL | Status: DC
  Administered 2019-06-09: 25 mg via ORAL
  Filled 2019-06-08 (×2): qty 1

## 2019-06-08 MED ORDER — ACETAMINOPHEN 325 MG PO TABS: 650.0000 mg | ORAL_TABLET | Freq: Four times a day (QID) | ORAL | Status: DC | PRN

## 2019-06-08 MED ORDER — ONDANSETRON HCL 4 MG/2ML IJ SOLN: 4.0000 mg | Freq: Four times a day (QID) | INTRAMUSCULAR | Status: DC | PRN

## 2019-06-08 MED ORDER — ACETAMINOPHEN 650 MG RE SUPP: 650.0000 mg | Freq: Four times a day (QID) | RECTAL | Status: DC | PRN

## 2019-06-08 MED ORDER — PROCHLORPERAZINE MALEATE 10 MG PO TABS
10.0000 mg | ORAL_TABLET | Freq: Four times a day (QID) | ORAL | Status: DC | PRN
  Filled 2019-06-08: qty 1

## 2019-06-08 MED ORDER — PIPERACILLIN-TAZOBACTAM 3.375 G IVPB 30 MIN
3.3750 g | Freq: Once | INTRAVENOUS | Status: DC
  Filled 2019-06-08: qty 50

## 2019-06-08 MED ORDER — NICOTINE 14 MG/24HR TD PT24
14.0000 mg | MEDICATED_PATCH | Freq: Every day | TRANSDERMAL | Status: DC
  Administered 2019-06-08: 14 mg via TRANSDERMAL
  Filled 2019-06-08: qty 1

## 2019-06-08 MED ORDER — MORPHINE SULFATE (PF) 2 MG/ML IV SOLN
2.0000 mg | INTRAVENOUS | Status: DC | PRN
  Administered 2019-06-08 – 2019-06-09 (×4): 2 mg via INTRAVENOUS
  Filled 2019-06-08 (×4): qty 1

## 2019-06-08 MED ORDER — ONDANSETRON HCL 4 MG PO TABS
4.0000 mg | ORAL_TABLET | Freq: Four times a day (QID) | ORAL | Status: DC | PRN
  Administered 2019-06-08: 4 mg via ORAL
  Filled 2019-06-08: qty 1

## 2019-06-08 MED ORDER — IOHEXOL 300 MG/ML  SOLN
80.0000 mL | Freq: Once | INTRAMUSCULAR | Status: AC | PRN
  Administered 2019-06-08: 80 mL via INTRAVENOUS

## 2019-06-08 MED ORDER — ADULT MULTIVITAMIN W/MINERALS CH
1.0000 | ORAL_TABLET | Freq: Every day | ORAL | Status: DC
  Administered 2019-06-08: 1 via ORAL
  Filled 2019-06-08: qty 1

## 2019-06-08 MED ORDER — PANTOPRAZOLE SODIUM 40 MG PO TBEC
40.0000 mg | DELAYED_RELEASE_TABLET | Freq: Two times a day (BID) | ORAL | Status: DC
  Administered 2019-06-08: 40 mg via ORAL
  Filled 2019-06-08: qty 1

## 2019-06-08 MED ORDER — POLYETHYLENE GLYCOL 3350 17 G PO PACK
1.0000 | PACK | Freq: Two times a day (BID) | ORAL | Status: DC
  Filled 2019-06-08: qty 1

## 2019-06-08 MED ORDER — AMLODIPINE BESYLATE 10 MG PO TABS
10.0000 mg | ORAL_TABLET | Freq: Every day | ORAL | Status: DC
  Administered 2019-06-08: 10 mg via ORAL
  Filled 2019-06-08: qty 1

## 2019-06-08 MED ORDER — PANTOPRAZOLE SODIUM 40 MG IV SOLR: 40.0000 mg | Freq: Two times a day (BID) | INTRAVENOUS | Status: DC

## 2019-06-08 MED ORDER — PANTOPRAZOLE SODIUM 40 MG PO TBEC
40.0000 mg | DELAYED_RELEASE_TABLET | Freq: Once | ORAL | Status: AC
  Administered 2019-06-08: 40 mg via ORAL
  Filled 2019-06-08: qty 1

## 2019-06-08 MED ORDER — IOHEXOL 9 MG/ML PO SOLN
500.0000 mL | ORAL | Status: AC
  Administered 2019-06-08: 500 mL via ORAL

## 2019-06-08 MED ORDER — POTASSIUM CHLORIDE IN NACL 20-0.9 MEQ/L-% IV SOLN
INTRAVENOUS | Status: DC
  Filled 2019-06-08: qty 1000

## 2019-06-08 MED ORDER — PIPERACILLIN-TAZOBACTAM 3.375 G IVPB 30 MIN
3.3750 g | Freq: Three times a day (TID) | INTRAVENOUS | Status: DC
  Administered 2019-06-08: 3.375 g via INTRAVENOUS

## 2019-06-08 MED ORDER — PIPERACILLIN-TAZOBACTAM 3.375 G IVPB: 3.3750 g | Freq: Three times a day (TID) | INTRAVENOUS | Status: DC

## 2019-06-08 MED ORDER — SODIUM CHLORIDE 0.9 % IV SOLN
2.0000 g | INTRAVENOUS | Status: DC
  Administered 2019-06-08: 2 g via INTRAVENOUS
  Filled 2019-06-08: qty 20
  Filled 2019-06-08: qty 2

## 2019-06-08 MED ORDER — OXYCODONE HCL 5 MG PO TABS
5.0000 mg | ORAL_TABLET | Freq: Four times a day (QID) | ORAL | Status: DC | PRN
  Administered 2019-06-08 – 2019-06-09 (×3): 5 mg via ORAL
  Filled 2019-06-08 (×3): qty 1

## 2019-06-08 MED ORDER — LOSARTAN POTASSIUM 50 MG PO TABS
100.0000 mg | ORAL_TABLET | Freq: Every day | ORAL | Status: DC
  Administered 2019-06-08 – 2019-06-09 (×2): 100 mg via ORAL
  Filled 2019-06-08 (×2): qty 2

## 2019-06-08 MED ORDER — SENNA 8.6 MG PO TABS
2.0000 | ORAL_TABLET | Freq: Every day | ORAL | Status: DC
  Filled 2019-06-08: qty 2

## 2019-06-08 NOTE — Progress Notes (Signed)
Pt. Arrived from [facility] via Care link.  Pt. Is alert and oriented times 4.  [facility] Admissions texted paged for admissions orders.

## 2019-06-08 NOTE — H&P (Signed)
History and Physical    [name] [name]:[id number] DOB: 05-25-1955 DOA: 06/08/2019  PCP: Olin Hauser, DO Consultants:  Tasia Catchings - oncology Patient coming from:  Home - lives with wife; NOK: Wife, Nathin Saran, [place]  Chief Complaint: Abnormal labs  HPI: Kenneth Maxwell is a 64 y.o. male with medical history significant of R renal cell carcinoma s/p nephrectomy; HTN; ETOH dependence; and pancreatic cancer presenting with abnormal labs (elevated bilirubin).  He went to [facility] yesterday to get routine chemo and his bili was elevated again.  He was scheduled for his second round of chemo.  He has a stent in there.  Thursday and Friday, he felt better than he has felt in a month, needing much less pain medication than prior.  He is having pain in his B upper abdomen and also with pain along his R breast need where his port is, particularly when he has been lying on it.  Not currently nauseated.  Morning coffee sometimes makes him feel nauseated.  Eating does not seem to make his symptoms worse but he has not had much of an appetite in the last 2 weeks.  He has lost from 189 to 170 in the last 2 months.  He did not notice that he looks yellow or scleral icterus.         ED Course:  [facility] to [facility] transfer, per Dr. Francine Graven:  History of pancreatic CA s/p biliary stent placement for biliary obstruction. Sent to the ER from the cancer center on 06/04  for elevated bilirubin suspicions for recurrent obstruction. Patient needs ERCP ER physician discussed with Dr Cristina Gong who will see patient when he gets here  Bilirubin is elevated at 10 with elevated alkaline phosphatase. WBC of 23K but shows a downward trend.  Review of Systems: As per HPI; otherwise review of systems reviewed and negative.   Ambulatory Status:  Ambulates without assistance  COVID Vaccine Status:  None  Past Medical History:  Diagnosis Date  . Alcohol abuse   . Arthritis   . Blood clotting disorder ([place])   .  Chronic renal disease   . Encounter for antineoplastic chemotherapy 05/24/2019  . H/O urinary retention   . Hematuria, gross   . Hypertension   . Pancreatic cancer metastasized to liver ([place]) 04/2019  . Renal cell carcinoma ([place]) 11/2013   kidney cancer s/p R nephrectomy    Past Surgical History:  Procedure Laterality Date  . ERCP N/A 04/28/2019   Procedure: ENDOSCOPIC RETROGRADE CHOLANGIOPANCREATOGRAPHY (ERCP);  Surgeon: Lucilla Lame, MD;  Location[facility] ENDOSCOPY;  Service: Endoscopy;  Laterality: N/A;  . kidney removed Right   . liver stent Right   . NEPHRECTOMY RADICAL Right 11/2013   R side kidney cancer surgery, non adrenal sparing  . PORTA CATH INSERTION N/A 05/20/2019   Procedure: PORTA CATH INSERTION;  Surgeon: Algernon Huxley, MD;  Location: [facility];  Service: Cardiovascular;  Laterality: N/A;    Social History   Socioeconomic History  . Marital status: Married    Spouse name: Not on file  . Number of children: Not on file  . Years of education: Not on file  . Highest education level: Not on file  Occupational History  . Occupation: welder  Tobacco Use  . Smoking status: Current Every Day Smoker    Packs/day: 1.00    Years: 50.00    Pack years: 50.00    Types: Cigarettes  . Smokeless tobacco: Never Used  . Tobacco comment:  down to >0.5ppd  Substance and Sexual Activity  . Alcohol use: Not Currently    Alcohol/week: 49.0 standard drinks    Types: 49 Cans of beer per week    Comment: h/o heavy use, quit about 3 months ago (before diagnosis)  . Drug use: Yes    Types: Marijuana  . Sexual activity: Yes  Other Topics Concern  . Not on file  Social History Narrative  . Not on file   Social Determinants of Health   Financial Resource Strain:   . Difficulty of Paying Living Expenses:   Food Insecurity:   . Worried About Charity fundraiser in the Last Year:   . Arboriculturist in the Last Year:   Transportation Needs:   . Film/video editor  (Medical):   Marland Kitchen Lack of Transportation (Non-Medical):   Physical Activity:   . Days of Exercise per Week:   . Minutes of Exercise per Session:   Stress:   . Feeling of Stress :   Social Connections:   . Frequency of Communication with Friends and Family:   . Frequency of Social Gatherings with Friends and Family:   . Attends Religious Services:   . Active Member of Clubs or Organizations:   . Attends Archivist Meetings:   Marland Kitchen Marital Status:   Intimate Partner Violence:   . Fear of Current or Ex-Partner:   . Emotionally Abused:   Marland Kitchen Physically Abused:   . Sexually Abused:     Allergies  Allergen Reactions  . Codeine Rash and Other (See Comments)    Family History  Problem Relation Age of Onset  . Hypertension Mother   . Diabetes Mother   . Hypertension Father   . Heart attack Father   . Cancer Neg Hx     Prior to Admission medications   Medication Sig Start Date End Date Taking? Authorizing Provider  amLODipine (NORVASC) 10 MG tablet Take 1 tablet (10 mg total) by mouth daily. 09/13/18   Karamalegos, Devonne Doughty, DO  dexamethasone (DECADRON) 4 MG tablet Take 2 tablets (8 mg total) by mouth daily. Start the day after chemotherapy for 3 days. Take with food. 05/24/19   Earlie Server, MD  lidocaine-prilocaine (EMLA) cream Apply to affected area once Patient taking differently: Apply 1 application topically. Use before Chemotherapy treatments. 05/17/19   Earlie Server, MD  loperamide (IMODIUM A-D) 2 MG tablet Take 2 at onset of diarrhea, then 1 every 2hrs until 12hr without a BM. May take 2 tab every 4hrs at bedtime. If diarrhea recurs repeat. 05/17/19   Earlie Server, MD  losartan (COZAAR) 100 MG tablet Take 1 tablet (100 mg total) by mouth daily. 09/13/18   Karamalegos, Devonne Doughty, DO  Multiple Vitamin (MULTIVITAMIN WITH MINERALS) TABS tablet Take 1 tablet by mouth daily. 04/30/19   Mercy Riding, MD  omeprazole (PRILOSEC) 20 MG capsule Take 1 capsule (20 mg total) by mouth 2 (two) times  daily before a meal. 05/27/19   Earlie Server, MD  oxyCODONE (ROXICODONE) 5 MG immediate release tablet Take 1 tablet (5 mg total) by mouth every 6 (six) hours as needed for severe pain. 06/06/19   Earlie Server, MD  Polyethylene Glycol 3350 (MIRALAX PO) Take by mouth. BID    [provider]  prochlorperazine (COMPAZINE) 10 MG tablet Take 1 tablet (10 mg total) by mouth every 6 (six) hours as needed (Nausea or vomiting). 05/17/19   Earlie Server, MD  senna (SENOKOT) 8.6 MG TABS  tablet Take 2 tablets (17.2 mg total) by mouth daily. 05/31/19   Earlie Server, MD  spironolactone (ALDACTONE) 25 MG tablet Take 1 tablet (25 mg total) by mouth daily. 04/30/19   Mercy Riding, MD    Physical Exam: Vitals:   06/08/19 1013 06/08/19 1500  BP: 114/70 101/66  Pulse: 94 86  Resp: 19 20  Temp: 99.1 F (37.3 C) 98.5 F (36.9 C)  TempSrc: Oral Oral  SpO2: 99% 100%     . General:  Appears calm and comfortable and is NAD . Eyes:  PERRL, EOMI, normal lids, iris; marked scleral icterus . ENT:  grossly normal hearing, lips & tongue, mmm . Neck:  no LAD, masses or thyromegaly . Cardiovascular:  RRR, no m/r/g. No LE edema.  Marland Kitchen Respiratory:   CTA bilaterally with no wheezes/rales/rhonchi.  Normal respiratory effort. . Abdomen:  Mildly distended in B flanks, marked TTP in midepigastric region . Skin:  no rash or induration seen on limited exam . Musculoskeletal:  grossly normal tone BUE/BLE, good ROM, no bony abnormality . Psychiatric:  grossly normal mood and affect, speech fluent and appropriate, AOx3 . Neurologic:  CN 2-12 grossly intact, moves all extremities in coordinated fashion    Radiological Exams on Admission: CT ABDOMEN PELVIS W CONTRAST  Result Date: 06/08/2019 CLINICAL DATA:  Pancreatic cancer, elevated bilirubin, generalized weakness, chronic abdominal pain worse in past few days, prior RIGHT nephrectomy 6-7 years ago due to renal cell carcinoma, history hypertension, smoker, history alcohol abuse EXAM: CT  ABDOMEN AND PELVIS WITH CONTRAST TECHNIQUE: Multidetector CT imaging of the abdomen and pelvis was performed using the standard protocol following bolus administration of intravenous contrast. Sagittal and coronal MPR images reconstructed from axial data set. CONTRAST:  30mL OMNIPAQUE IOHEXOL 300 MG/ML SOLN IV. Dilute oral contrast. COMPARISON:  07/30/2014 FINDINGS: Lower chest: Minimal dependent bibasilar atelectasis Hepatobiliary: CB stent identified, tip at ampulla and flange in distal CBD. CBD dilatation present up to 20 mm diameter, new. Intrahepatic biliary dilatation, new. Small calcified gallstone in gallbladder. Minimal gallbladder wall thickening. Hepatic metastatic lesions identified, lateral segment LEFT lobe 3.1 x 2.7 cm image 20 slightly increased. Additional lesion anterior RIGHT lobe more inferiorly 2.5 x 1.9 cm image 35. Potential additional small lesion more posteriorly RIGHT lobe 12 mm diameter image 23. Pancreas: Enlargement of pancreatic head/body by tumor little changed. Necrotic peripancreatic lymph node 12 mm short axis image 38, minimally increased. Spleen: Normal appearance Adrenals/Urinary Tract: Stable LEFT adrenal mass 23 x 15 mm. Unremarkable LEFT kidney. Post RIGHT nephrectomy. Stomach/Bowel: Colon under distended with suboptimal assessment of wall thickness. Stomach and remaining bowel loops unremarkable. Vascular/Lymphatic: Atherosclerotic calcifications aorta, proximal visceral arteries, iliac arteries, coronary arteries. Aorta normal caliber. Borderline enlarged portal caval lymph node 10 mm short axis image 27. Reproductive: Minimal prostatic enlargement. Seminal vesicles unremarkable. Other: No free air or free fluid.  No hernia. Musculoskeletal: Osseous structures unremarkable. IMPRESSION: New biliary dilatation despite CB stent, the tip of which appears to be at the ampulla with the flange in the distal CBD, question occluded versus slightly displaced stent. Cholelithiasis.  Unchanged appearance of pancreatic head/body neoplasm. Hepatic metastases, slightly increased in sizes since prior exam. Stable LEFT adrenal mass. Minimally increased size of necrotic peripancreatic lymph node. Aortic Atherosclerosis (ICD10-I70.0). Electronically Signed   By: Lavonia Dana M.D.   On: 06/08/2019 15:49    EKG: not done   Labs on Admission: I have personally reviewed the available labs and imaging studies at the time of the  admission.  Pertinent labs:   Na++ 125 K+ 3.2 Glucose 112 AP 848; 286 on 5/28 Albumin 2.7 AST 81/ALT 206/Bili 10/4; 74/107/1.2 on 5/28 Lactate 0.8 WBC 23.5; 48.8 on 5/28 - given Neulasta on 5/24 Hgb 11.7; 12.2 on 5/28, 13.4 on 5/21  INR 1.2 UA: 30 protein   Assessment/Plan Principal Problem:   Obstructive jaundice due to cancer [facility]) Active Problems:   Hypertension   Alcohol abuse   History of renal cell carcinoma   Compulsive tobacco user syndrome   Hypokalemia   Pancreatic adenocarcinoma (HCC)   Obstructive jaundice -Patient with known pancreatic cancer, prior CBD stent placed on 4/25 -Now presenting with worsening LFTs and jaundice, bili 10+ -He was evaluated at [facility] and offered ERCP on Monday by his doctor but was transferred to [facility] instead for apparent ERCP tomorrow or Monday -Patient admitted to med surg -GI consulted -CT abdomen ordered which shows new biliary dilatation despite the stent - ?occlusion vs. Slight displacement -Patient was ordered a liquid diet with NPO after MN and refused; regular diet ordered at patient insistence -Will add antibiotics as per Dr. Erlinda Hong note -NPO after MN in case of procedure tomorrow  Pancreatic cancer -Diagnosis confirmed on 5/7, metastatic to liver -Has had 1 cycle of FOLFIRINOX -Chemotherapy is currently on hold due to hyperbilirubinemia -Continue Oxycodone and add morphine as needed for pain  H/o ETOH dependence -Quit drinking prior to diagnosis because it didn't taste good  anymore -Should not need CIWA  HTN -Continue Norvasc, Cozaar, Aldactone  H/o RCC -Remote R nephrectomy without known recurrence  Hypokalemia -Last labs were done at 0800 on 6/4 -Repeat labs ordered and are pending; will ask night team to f/u and replete if needed -Will add gentle IVF hydration for now with 20 mEq KCl/L at 50 cc/hr  Tobacco dependence -Encourage cessation.   -This was discussed with the patient and should be reviewed on an ongoing basis.   -He is requesting to "go outside" but clearly this is not in his best interests and so the request was declined. -Patch ordered at patient request.    Note: This patient has been tested and is negative for the novel coronavirus COVID-19.  DVT prophylaxis: SCDs Code Status:  Full - confirmed with patient Family Communication: None present Disposition Plan:  The patient is from: home  Anticipated d/c is to: home without [facility] services   Anticipated d/c date will depend on clinical response to treatment, likely several days  Patient is currently: acutely ill Consults called: GI Admission status:  Admit - It is my clinical opinion that admission to [place] is reasonable and necessary because of the expectation that this patient will require hospital care that crosses at least 2 midnights to treat this condition based on the medical complexity of the problems presented.  Given the aforementioned information, the predictability of an adverse outcome is felt to be significant.    Karmen Bongo MD [facility]   How to contact the [facility] Attending or Consulting provider [place] or covering provider during after hours [place], for this patient?  1. Check the care team in [facility] and look for a) attending/consulting TR[facility] provider listed and b) the [facility] team listed 2. Log into [email or web address] and use [place]'s universal password to access. If you do not have the password, please contact the hospital operator. 3. Locate the [facility] provider  you are looking for under [facility] and page to a number that you can be directly reached. 4.  If you still have difficulty reaching the provider, please page the [facility] (Director on Call) for the Hospitalists listed on amion for assistance.   06/08/2019, 5:46 PM

## 2019-06-08 NOTE — Plan of Care (Signed)
Case discussed with Dr. Lorin Mercy.  Patient transferred to [facility] for presumed diagnosis of bile duct obstruction, history of metastatic pancreatic cancer.  Had plastic bile duct stent placed in April by Dr. Allen Norris.  Recent increase in LFTs (after prior normalization from stent placement). Elevated WBC.  Normotensive; afebrile.  Has known liver metastases and no imaging has been done since most recent increase in LFTs.  I have asked Dr. Lorin Mercy to get CT abdomen/pelvis with contrast for further evaluation, specifically to assess for liver metastases, liver abscess, biliary ductal dilatation.  Exact course of management is not clear until follow-up imaging is done.  CT has been ordered.  In the meantime, would continue medical therapy with hydration and IV antibiotics and follow LFT trend.  Pending CT findings, patient may need ERCP this admission. If exact nature of cause of elevated LFTs not clear on CT scan (ie, if not clear whether from biliary obstruction versus liver metastases), patient may need MRI/MRCP for further characterization.  [facility] will revisit tomorrow.

## 2019-06-08 NOTE — ED Notes (Signed)
This charge nurse reviewed EMTALA, medical necessity, and signature for transfer, and found them complete.

## 2019-06-09 DIAGNOSIS — R748 Abnormal levels of other serum enzymes: Secondary | ICD-10-CM | POA: Diagnosis not present

## 2019-06-09 DIAGNOSIS — F17200 Nicotine dependence, unspecified, uncomplicated: Secondary | ICD-10-CM

## 2019-06-09 DIAGNOSIS — F1010 Alcohol abuse, uncomplicated: Secondary | ICD-10-CM

## 2019-06-09 DIAGNOSIS — C259 Malignant neoplasm of pancreas, unspecified: Secondary | ICD-10-CM

## 2019-06-09 DIAGNOSIS — Z85528 Personal history of other malignant neoplasm of kidney: Secondary | ICD-10-CM | POA: Diagnosis not present

## 2019-06-09 DIAGNOSIS — K831 Obstruction of bile duct: Secondary | ICD-10-CM | POA: Diagnosis not present

## 2019-06-09 DIAGNOSIS — R932 Abnormal findings on diagnostic imaging of liver and biliary tract: Secondary | ICD-10-CM | POA: Diagnosis not present

## 2019-06-09 LAB — BASIC METABOLIC PANEL
Anion gap: 11 (ref 5–15)
BUN: 6 mg/dL — ABNORMAL LOW (ref 8–23)
CO2: 20 mmol/L — ABNORMAL LOW (ref 22–32)
Calcium: 8.7 mg/dL — ABNORMAL LOW (ref 8.9–10.3)
Chloride: 97 mmol/L — ABNORMAL LOW (ref 98–111)
Creatinine, Ser: 0.87 mg/dL (ref 0.61–1.24)
GFR calc Af Amer: >60 mL/min (ref >60)
GFR calc non Af Amer: >60 mL/min (ref >60)
Glucose, Bld: 91 mg/dL (ref 70–99)
Potassium: 3.5 mmol/L (ref 3.5–5.1)
Sodium: 128 mmol/L — ABNORMAL LOW (ref 135–145)

## 2019-06-09 LAB — CBC
HCT: 32.9 % — ABNORMAL LOW (ref 39.0–52.0)
Hemoglobin: 11.5 g/dL — ABNORMAL LOW (ref 13.0–17.0)
MCH: 30.3 pg (ref 26.0–34.0)
MCHC: 35 g/dL (ref 30.0–36.0)
MCV: 86.6 fL (ref 80.0–100.0)
Platelets: 254 10*3/uL (ref 150–400)
RBC: 3.8 MIL/uL — ABNORMAL LOW (ref 4.22–5.81)
RDW: 15.1 % (ref 11.5–15.5)
WBC: 16.8 10*3/uL — ABNORMAL HIGH (ref 4.0–10.5)
nRBC: 0 % (ref 0.0–0.2)

## 2019-06-09 MED ORDER — POTASSIUM CHLORIDE CRYS ER 20 MEQ PO TBCR
20.0000 meq | EXTENDED_RELEASE_TABLET | Freq: Two times a day (BID) | ORAL | Status: DC
  Administered 2019-06-09: 20 meq via ORAL
  Filled 2019-06-09: qty 1

## 2019-06-09 MED ORDER — NICOTINE 21 MG/24HR TD PT24
21.0000 mg | MEDICATED_PATCH | Freq: Every day | TRANSDERMAL | Status: DC
  Administered 2019-06-09: 21 mg via TRANSDERMAL
  Filled 2019-06-09: qty 1

## 2019-06-09 MED ORDER — MORPHINE SULFATE (PF) 4 MG/ML IV SOLN
4.0000 mg | INTRAVENOUS | Status: DC | PRN
  Administered 2019-06-09: 4 mg via INTRAVENOUS
  Filled 2019-06-09: qty 1

## 2019-06-09 MED ORDER — HEPARIN SOD (PORK) LOCK FLUSH 100 UNIT/ML IV SOLN
500.0000 [IU] | INTRAVENOUS | Status: AC | PRN
Start: 2019-06-09 — End: 2019-06-09
  Administered 2019-06-09: 500 [IU]

## 2019-06-09 MED ORDER — CHLORHEXIDINE GLUCONATE CLOTH 2 % EX PADS: 6.0000 | MEDICATED_PAD | Freq: Every day | CUTANEOUS | Status: DC

## 2019-06-09 MED ORDER — OXYCODONE HCL 5 MG PO TABS: 5.0000 mg | ORAL_TABLET | ORAL | Status: DC | PRN

## 2019-06-09 NOTE — Progress Notes (Signed)
Pt left against medical advice. Declined to sign AMA form. MD was notified and informed. Pt left unit.

## 2019-06-09 NOTE — Progress Notes (Signed)
Pt stated that he has not been updated about plan of care. Pt declined morning meds until further update from MD. MD paged and notifed. Will continue to monitor pt.

## 2019-06-09 NOTE — Progress Notes (Signed)
[facility] PROGRESS NOTE    Progress Note  Kenneth Maxwell  TKP:[id number] DOB: 02/01/1955 DOA: 06/08/2019 PCP: Olin Hauser, DO     Brief Narrative:   Kenneth Maxwell is an 64 y.o. male past medical history of renal cell carcinoma status post nephrectomy with known metastatic liver disease who underwent ERCP on 04/28/2019 status post biliary sphincterotomy and stent placement, essential hypertension alcohol abuse and pancreatic cancer comes in with an elevated bilirubin went to [facility] for routine chemo and was found to have significant elevated bilirubin, CT scan of the abdomen and pelvis was done that showed new biliary dilation despite stent at the tip of the ampulla with increase in size of hepatic metastases he was started empirically on IV antibiotics, GI was consulted who recommended an ERCP  Assessment/Plan:   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  Bilirubin is as high as 12, CT showed new dilation of the common bile duct and increase in size of hepatic metastases. GI has already been consulted, the patient is currently n.p.o.  For possible ERCP. Awaiting GI further recommendations. Increase his narcotics IV.  Metastatic pancreatic cancer: Diagnosed on 05/10/2019 on chemotherapy. Continue narcotics for pain control. Poor prognosis we will consult hospice and palliative care.  History of alcohol dependence: Monitor with CIWA protocol.  Essential hypertension: Continue Norvasc Cozaar and Aldactone  History of remote renal cell carcinoma: Noted.  Hypokalemia: Replete orally recheck tomorrow morning.  Compulsive tobacco user syndrome   DVT prophylaxis: lovenox Family Communication:none Status is: Inpatient  Remains inpatient appropriate because:IV treatments appropriate due to intensity of illness or inability to take PO   Dispo: The patient is from: Home  Anticipated d/c is to: Home              Anticipated d/c date is: 2 days              Patient currently is not medically stable to d/c.  Code Status:     Code Status Orders  (From admission, onward)         Start     Ordered   06/08/19 0849  Full code  Continuous     06/08/19 0849        Code Status History    Date Active Date Inactive Code Status Order ID Comments User Context   04/26/2019 1650 04/29/2019 2030 Full Code [id number]  Ivor Costa, MD Inpatient   Advance Care Planning Activity        IV Access:    Peripheral IV   Procedures and diagnostic studies:   CT ABDOMEN PELVIS W CONTRAST  Result Date: 06/08/2019 CLINICAL DATA:  Pancreatic cancer, elevated bilirubin, generalized weakness, chronic abdominal pain worse in past few days, prior RIGHT nephrectomy 6-7 years ago due to renal cell carcinoma, history hypertension, smoker, history alcohol abuse EXAM: CT ABDOMEN AND PELVIS WITH CONTRAST TECHNIQUE: Multidetector CT imaging of the abdomen and pelvis was performed using the standard protocol following bolus administration of intravenous contrast. Sagittal and coronal MPR images reconstructed from axial data set. CONTRAST:  15mL OMNIPAQUE IOHEXOL 300 MG/ML SOLN IV. Dilute oral contrast. COMPARISON:  07/30/2014 FINDINGS: Lower chest: Minimal dependent bibasilar atelectasis Hepatobiliary: CB stent identified, tip at ampulla and flange in distal CBD. CBD dilatation present up to 20 mm diameter, new. Intrahepatic biliary dilatation, new. Small calcified gallstone in gallbladder. Minimal gallbladder wall thickening. Hepatic metastatic lesions identified, lateral segment LEFT lobe 3.1 x 2.7  cm image 20 slightly increased. Additional lesion anterior RIGHT lobe more inferiorly 2.5 x 1.9 cm image 35. Potential additional small lesion more posteriorly RIGHT lobe 12 mm diameter image 23. Pancreas: Enlargement of pancreatic head/body by tumor little changed. Necrotic peripancreatic lymph  node 12 mm short axis image 38, minimally increased. Spleen: Normal appearance Adrenals/Urinary Tract: Stable LEFT adrenal mass 23 x 15 mm. Unremarkable LEFT kidney. Post RIGHT nephrectomy. Stomach/Bowel: Colon under distended with suboptimal assessment of wall thickness. Stomach and remaining bowel loops unremarkable. Vascular/Lymphatic: Atherosclerotic calcifications aorta, proximal visceral arteries, iliac arteries, coronary arteries. Aorta normal caliber. Borderline enlarged portal caval lymph node 10 mm short axis image 27. Reproductive: Minimal prostatic enlargement. Seminal vesicles unremarkable. Other: No free air or free fluid.  No hernia. Musculoskeletal: Osseous structures unremarkable. IMPRESSION: New biliary dilatation despite CB stent, the tip of which appears to be at the ampulla with the flange in the distal CBD, question occluded versus slightly displaced stent. Cholelithiasis. Unchanged appearance of pancreatic head/body neoplasm. Hepatic metastases, slightly increased in sizes since prior exam. Stable LEFT adrenal mass. Minimally increased size of necrotic peripancreatic lymph node. Aortic Atherosclerosis (ICD10-I70.0). Electronically Signed   By: Lavonia Dana M.D.   On: 06/08/2019 15:49     Medical Consultants:    None.  Anti-Infectives:   rocephin  Subjective:    Kenneth Maxwell relates he continues to be in pain that started yesterday back pain  Objective:    Vitals:   06/08/19 1013 06/08/19 1500 06/08/19 2036 06/09/19 0305  BP: 114/70 101/66 117/67 114/75  Pulse: 94 86 80 88  Resp: 19 20 17 18   Temp: 99.1 F (37.3 C) 98.5 F (36.9 C) 98.5 F (36.9 C) 98.8 F (37.1 C)  TempSrc: Oral Oral  Oral  SpO2: 99% 100% 99% 99%   SpO2: 99 %   Intake/Output Summary (Last 24 hours) at 06/09/2019 0944 Last data filed at 06/09/2019 0354 Gross per 24 hour  Intake 1051.62 ml  Output --  Net 1051.62 ml   There were no vitals filed for this visit.  Exam: General exam:  In no acute distress. Respiratory system: Good air movement and clear to auscultation. Cardiovascular system: S1 & S2 heard, RRR. No JVD. Gastrointestinal system: Abdomen is nondistended, soft and nontender.  Extremities: No pedal edema. Skin: No rashes, lesions or ulcers  Data Reviewed:    Labs: Basic Metabolic Panel: Recent Labs  Lab 06/07/19 0800 06/07/19 0800 06/08/19 1755 06/09/19 0256  NA 125*  --  128* 128*  K 3.2*   < > 3.5 3.5  CL 92*  --  94* 97*  CO2 22  --  21* 20*  GLUCOSE 112*  --  126* 91  BUN 13  --  6* 6*  CREATININE 0.97  --  0.83 0.87  CALCIUM 8.6*  --  8.8* 8.7*   < > = values in this interval not displayed.   GFR Estimated Creatinine Clearance: 95 mL/min (by C-G formula based on SCr of 0.87 mg/dL). Liver Function Tests: Recent Labs  Lab 06/07/19 0800 06/08/19 1755  AST 81* 119*  ALT 206* 240*  ALKPHOS 848* 1,033*  BILITOT 10.4* 12.7*  PROT 6.6 6.2*  ALBUMIN 2.7* 2.1*   No results for input(s): LIPASE, AMYLASE in the last 168 hours. No results for input(s): AMMONIA in the last 168 hours. Coagulation profile Recent Labs  Lab 06/07/19 0947  INR 1.2   COVID-19 Labs  No results for input(s): DDIMER, FERRITIN, LDH, CRP in the  last 72 hours.  Lab Results  Component Value Date   SARSCOV2NAA NEGATIVE 06/07/2019   [place] NEGATIVE 05/16/2019   [place] NEGATIVE 04/26/2019    CBC: Recent Labs  Lab 06/07/19 0800 06/08/19 1755 06/09/19 0256  WBC 23.5* 17.3* 16.8*  NEUTROABS 18.9* 14.1*  --   HGB 11.7* 11.4* 11.5*  HCT 32.6* 32.4* 32.9*  MCV 85.1 87.1 86.6  PLT 234 255 254   Cardiac Enzymes: No results for input(s): CKTOTAL, CKMB, CKMBINDEX, TROPONINI in the last 168 hours. BNP (last 3 results) No results for input(s): PROBNP in the last 8760 hours. CBG: No results for input(s): GLUCAP in the last 168 hours. D-Dimer: No results for input(s): DDIMER in the last 72 hours. Hgb A1c: No results for input(s): HGBA1C in the  last 72 hours. Lipid Profile: No results for input(s): CHOL, HDL, LDLCALC, TRIG, CHOLHDL, LDLDIRECT in the last 72 hours. Thyroid function studies: No results for input(s): TSH, T4TOTAL, T3FREE, THYROIDAB in the last 72 hours.  Invalid input(s): FREET3 Anemia work up: No results for input(s): VITAMINB12, FOLATE, FERRITIN, TIBC, IRON, RETICCTPCT in the last 72 hours. Sepsis Labs: Recent Labs  Lab 06/07/19 0800 06/07/19 0947 06/08/19 1755 06/09/19 0256  WBC 23.5*  --  17.3* 16.8*  LATICACIDVEN  --  0.8  --   --    Microbiology Recent Results (from the past 240 hour(s))  SARS Coronavirus 2 by RT PCR (hospital order, performed in [facility] hospital lab) Nasopharyngeal Nasopharyngeal Swab     Status: None   Collection Time: 06/07/19 12:33 PM   Specimen: Nasopharyngeal Swab  Result Value Ref Range Status   SARS Coronavirus 2 NEGATIVE NEGATIVE Final    Comment: (NOTE) SARS-CoV-2 target nucleic acids are NOT DETECTED. The SARS-CoV-2 RNA is generally detectable in upper and lower respiratory specimens during the acute phase of infection. The lowest concentration of SARS-CoV-2 viral copies this assay can detect is 250 copies / mL. A negative result does not preclude SARS-CoV-2 infection and should not be used as the sole basis for treatment or other patient management decisions.  A negative result may occur with improper specimen collection / handling, submission of specimen other than nasopharyngeal swab, presence of viral mutation(s) within the areas targeted by this assay, and inadequate number of viral copies (<250 copies / mL). A negative result must be combined with clinical observations, patient history, and epidemiological information. Fact Sheet for Patients:   StrictlyIdeas.no Fact Sheet for Healthcare Providers: BankingDealers.co.za This test is not yet approved or cleared  by the [place] FDA and has been authorized  for detection and/or diagnosis of SARS-CoV-2 by FDA under an Emergency Use Authorization (EUA).  This EUA will remain in effect (meaning this test can be used) for the duration of the COVID-19 declaration under Section 564(b)(1) of the Act, 21 U.S.C. section 360bbb-3(b)(1), unless the authorization is terminated or revoked sooner. Performed at [facility], [address] [place], [place] [address]      Medications:   . amLODipine  10 mg Oral Daily  . losartan  100 mg Oral Daily  . multivitamin with minerals  1 tablet Oral Daily  . nicotine  14 mg Transdermal Daily  . pantoprazole (PROTONIX) IV  40 mg Intravenous Q12H  . polyethylene glycol  1 packet Oral BID  . senna  2 tablet Oral Daily  . spironolactone  25 mg Oral Daily   Continuous Infusions: . 0.9 % NaCl with KCl 20 mEq / L 50 mL/hr at 06/08/19  1845  . cefTRIAXone (ROCEPHIN)  IV 2 g (06/08/19 2032)      LOS: 1 day   Charlynne Cousins  [facility]  06/09/2019, 9:44 AM

## 2019-06-10 ENCOUNTER — Inpatient Hospital Stay: Payer: BC Managed Care – PPO

## 2019-06-10 ENCOUNTER — Telehealth: Payer: Self-pay

## 2019-06-10 ENCOUNTER — Encounter: Payer: Self-pay | Admitting: Oncology

## 2019-06-10 ENCOUNTER — Other Ambulatory Visit: Payer: Self-pay

## 2019-06-10 DIAGNOSIS — K831 Obstruction of bile duct: Secondary | ICD-10-CM

## 2019-06-10 SURGERY — ERCP, WITH INTERVENTION IF INDICATED
Anesthesia: General

## 2019-06-10 NOTE — Telephone Encounter (Signed)
  Patient was recently discharged from the hospital on 06/09/2019  No TCM completed, patient does not qualify for TCM services due to insurance coverage/BCBS  Per discharge summary patient needs follow up with PCP, message sent to [facility] staff to reach out to patient to make appointment

## 2019-06-10 NOTE — Telephone Encounter (Signed)
Omniseq test results scanned under media.

## 2019-06-11 NOTE — Discharge Summary (Signed)
Physician Discharge Summary  Kenneth Maxwell VXB:[id number] DOB: [birth year] DOA: 06/08/2019  PCP: Olin Hauser, DO  Admit date: 06/08/2019 Discharge date: 06/11/2019  Admitted From: Home Disposition:  Home          LEFT AMA Recommendations for Outpatient Follow-up:  1.   Home Health:No Equipment/Devices:None  Discharge Condition:Stable CODE STATUS:Full Diet recommendation: Heart Healthy  Brief/Interim Summary: 64 y.o. male past medical history of renal cell carcinoma status post nephrectomy with known metastatic liver disease who underwent ERCP on 04/28/2019 status post biliary sphincterotomy and stent placement, essential hypertension alcohol abuse and pancreatic cancer comes in with an elevated bilirubin went to [facility] for routine chemo and was found to have significant elevated bilirubin, CT scan of the abdomen and pelvis was done that showed new biliary dilation despite stent at the tip of the ampulla with increase in size of hepatic metastases he was started empirically on IV antibiotics, GI was consulted who recommended an ERCP  Discharge Diagnoses:  Principal Problem:   Malignant obstructive jaundice ([place]) Active Problems:   Hypertension   Alcohol abuse   History of renal cell carcinoma   Compulsive tobacco user syndrome   Hypokalemia   Pancreatic adenocarcinoma ([place])  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 GI was consulted placed him n.p.o. and recommended an ERCP, despite counseling the patient about staying he decided to leave [place] and will see his gastroenterologist at [facility].  Metastatic pancreatic cancer: Diagnosed on 05/10/2019 on chemotherapy continue narcotics for pain control.  History of alcohol dependence: No signs of withdrawal.  Essential hypertension: Continue Norvasc was on Aldactone.  History of remote renal cell carcinoma:   noted  Hypokalemia: Replete  orally recheck tomorrow morning.  Discharge Instructions   Allergies as of 06/09/2019      Reactions   Codeine Rash, Other (See Comments)      Medication List    ASK your doctor about these medications   amLODipine 10 MG tablet Commonly known as: NORVASC Take 1 tablet (10 mg total) by mouth daily.   dexamethasone 4 MG tablet Commonly known as: DECADRON Take 2 tablets (8 mg total) by mouth daily. Start the day after chemotherapy for 3 days. Take with food.   lidocaine-prilocaine cream Commonly known as: EMLA Apply to affected area once   loperamide 2 MG tablet Commonly known as: Imodium A-D Take 2 at onset of diarrhea, then 1 every 2hrs until 12hr without a BM. May take 2 tab every 4hrs at bedtime. If diarrhea recurs repeat.   losartan 100 MG tablet Commonly known as: COZAAR Take 1 tablet (100 mg total) by mouth daily.   MIRALAX PO Take by mouth. BID   multivitamin with minerals Tabs tablet Take 1 tablet by mouth daily.   omeprazole 20 MG capsule Commonly known as: PRILOSEC Take 1 capsule (20 mg total) by mouth 2 (two) times daily before a meal.   oxyCODONE 5 MG immediate release tablet Commonly known as: Roxicodone Take 1 tablet (5 mg total) by mouth every 6 (six) hours as needed for severe pain.   prochlorperazine 10 MG tablet Commonly known as: COMPAZINE Take 1 tablet (10 mg total) by mouth every 6 (six) hours as needed (Nausea or vomiting).   senna 8.6 MG Tabs tablet Commonly known as: SENOKOT Take 2 tablets (17.2 mg total) by mouth daily.   spironolactone 25 MG tablet Commonly known as: ALDACTONE Take 1 tablet (25 mg total) by mouth daily.  Allergies  Allergen Reactions  . Codeine Rash and Other (See Comments)    Consultations:  Gastroenterology   Procedures/Studies: CT ABDOMEN PELVIS W CONTRAST  Result Date: 06/08/2019 CLINICAL DATA:  Pancreatic cancer, elevated bilirubin, generalized weakness, chronic abdominal pain worse in past few  days, prior RIGHT nephrectomy 6-7 years ago due to renal cell carcinoma, history hypertension, smoker, history alcohol abuse EXAM: CT ABDOMEN AND PELVIS WITH CONTRAST TECHNIQUE: Multidetector CT imaging of the abdomen and pelvis was performed using the standard protocol following bolus administration of intravenous contrast. Sagittal and coronal MPR images reconstructed from axial data set. CONTRAST:  67mL OMNIPAQUE IOHEXOL 300 MG/ML SOLN IV. Dilute oral contrast. COMPARISON:  07/30/2014 FINDINGS: Lower chest: Minimal dependent bibasilar atelectasis Hepatobiliary: CB stent identified, tip at ampulla and flange in distal CBD. CBD dilatation present up to 20 mm diameter, new. Intrahepatic biliary dilatation, new. Small calcified gallstone in gallbladder. Minimal gallbladder wall thickening. Hepatic metastatic lesions identified, lateral segment LEFT lobe 3.1 x 2.7 cm image 20 slightly increased. Additional lesion anterior RIGHT lobe more inferiorly 2.5 x 1.9 cm image 35. Potential additional small lesion more posteriorly RIGHT lobe 12 mm diameter image 23. Pancreas: Enlargement of pancreatic head/body by tumor little changed. Necrotic peripancreatic lymph node 12 mm short axis image 38, minimally increased. Spleen: Normal appearance Adrenals/Urinary Tract: Stable LEFT adrenal mass 23 x 15 mm. Unremarkable LEFT kidney. Post RIGHT nephrectomy. Stomach/Bowel: Colon under distended with suboptimal assessment of wall thickness. Stomach and remaining bowel loops unremarkable. Vascular/Lymphatic: Atherosclerotic calcifications aorta, proximal visceral arteries, iliac arteries, coronary arteries. Aorta normal caliber. Borderline enlarged portal caval lymph node 10 mm short axis image 27. Reproductive: Minimal prostatic enlargement. Seminal vesicles unremarkable. Other: No free air or free fluid.  No hernia. Musculoskeletal: Osseous structures unremarkable. IMPRESSION: New biliary dilatation despite CB stent, the tip of which  appears to be at the ampulla with the flange in the distal CBD, question occluded versus slightly displaced stent. Cholelithiasis. Unchanged appearance of pancreatic head/body neoplasm. Hepatic metastases, slightly increased in sizes since prior exam. Stable LEFT adrenal mass. Minimally increased size of necrotic peripancreatic lymph node. Aortic Atherosclerosis (ICD10-I70.0). Electronically Signed   By: Lavonia Dana M.D.   On: 06/08/2019 15:49   PERIPHERAL VASCULAR CATHETERIZATION  Result Date: 05/20/2019 See op note     Subjective: Patient in a bad mood and attitude.  Discharge Exam: Vitals:   06/08/19 2036 06/09/19 0305  BP: 117/67 114/75  Pulse: 80 88  Resp: 17 18  Temp: 98.5 F (36.9 C) 98.8 F (37.1 C)  SpO2: 99% 99%   Vitals:   06/08/19 1013 06/08/19 1500 06/08/19 2036 06/09/19 0305  BP: 114/70 101/66 117/67 114/75  Pulse: 94 86 80 88  Resp: 19 20 17 18   Temp: 99.1 F (37.3 C) 98.5 F (36.9 C) 98.5 F (36.9 C) 98.8 F (37.1 C)  TempSrc: Oral Oral  Oral  SpO2: 99% 100% 99% 99%    General: Pt is alert, awake, not in acute distress Cardiovascular: RRR, S1/S2 +, no rubs, no gallops Respiratory: CTA bilaterally, no wheezing, no rhonchi Abdominal: Soft, NT, ND, bowel sounds + Extremities: no edema, no cyanosis    The results of significant diagnostics from this hospitalization (including imaging, microbiology, ancillary and laboratory) are listed below for reference.     Microbiology: Recent Results (from the past 240 hour(s))  SARS Coronavirus 2 by RT PCR (hospital order, performed in [facility] hospital lab) Nasopharyngeal Nasopharyngeal Swab     Status: None  Collection Time: 06/07/19 12:33 PM   Specimen: Nasopharyngeal Swab  Result Value Ref Range Status   SARS Coronavirus 2 NEGATIVE NEGATIVE Final    Comment: (NOTE) SARS-CoV-2 target nucleic acids are NOT DETECTED. The SARS-CoV-2 RNA is generally detectable in upper and lower respiratory specimens  during the acute phase of infection. The lowest concentration of SARS-CoV-2 viral copies this assay can detect is 250 copies / mL. A negative result does not preclude SARS-CoV-2 infection and should not be used as the sole basis for treatment or other patient management decisions.  A negative result may occur with improper specimen collection / handling, submission of specimen other than nasopharyngeal swab, presence of viral mutation(s) within the areas targeted by this assay, and inadequate number of viral copies (<250 copies / mL). A negative result must be combined with clinical observations, patient history, and epidemiological information. Fact Sheet for Patients:   StrictlyIdeas.no Fact Sheet for Healthcare Providers: BankingDealers.co.za This test is not yet approved or cleared  by the [place] FDA and has been authorized for detection and/or diagnosis of SARS-CoV-2 by FDA under an Emergency Use Authorization (EUA).  This EUA will remain in effect (meaning this test can be used) for the duration of the COVID-19 declaration under Section 564(b)(1) of the Act, 21 U.S.C. section 360bbb-3(b)(1), unless the authorization is terminated or revoked sooner. Performed at [facility], [address] [place], [place] [address]      Labs: BNP (last 3 results) No results for input(s): BNP in the last 8760 hours. Basic Metabolic Panel: Recent Labs  Lab 06/07/19 0800 06/08/19 1755 06/09/19 0256  NA 125* 128* 128*  K 3.2* 3.5 3.5  CL 92* 94* 97*  CO2 22 21* 20*  GLUCOSE 112* 126* 91  BUN 13 6* 6*  CREATININE 0.97 0.83 0.87  CALCIUM 8.6* 8.8* 8.7*   Liver Function Tests: Recent Labs  Lab 06/07/19 0800 06/08/19 1755  AST 81* 119*  ALT 206* 240*  ALKPHOS 848* 1,033*  BILITOT 10.4* 12.7*  PROT 6.6 6.2*  ALBUMIN 2.7* 2.1*   No results for input(s): LIPASE, AMYLASE in the last 168 hours. No results for input(s):  AMMONIA in the last 168 hours. CBC: Recent Labs  Lab 06/07/19 0800 06/08/19 1755 06/09/19 0256  WBC 23.5* 17.3* 16.8*  NEUTROABS 18.9* 14.1*  --   HGB 11.7* 11.4* 11.5*  HCT 32.6* 32.4* 32.9*  MCV 85.1 87.1 86.6  PLT 234 255 254   Cardiac Enzymes: No results for input(s): CKTOTAL, CKMB, CKMBINDEX, TROPONINI in the last 168 hours. BNP: Invalid input(s): POCBNP CBG: No results for input(s): GLUCAP in the last 168 hours. D-Dimer No results for input(s): DDIMER in the last 72 hours. Hgb A1c No results for input(s): HGBA1C in the last 72 hours. Lipid Profile No results for input(s): CHOL, HDL, LDLCALC, TRIG, CHOLHDL, LDLDIRECT in the last 72 hours. Thyroid function studies No results for input(s): TSH, T4TOTAL, T3FREE, THYROIDAB in the last 72 hours.  Invalid input(s): FREET3 Anemia work up No results for input(s): VITAMINB12, FOLATE, FERRITIN, TIBC, IRON, RETICCTPCT in the last 72 hours. Urinalysis    Component Value Date/Time   COLORURINE AMBER (A) 06/07/2019 0947   APPEARANCEUR CLEAR (A) 06/07/2019 0947   APPEARANCEUR Clear 02/11/2015 1004   LABSPEC 1.011 06/07/2019 0947   LABSPEC 1.005 11/07/2013 1115   PHURINE 6.0 06/07/2019 0947   GLUCOSEU NEGATIVE 06/07/2019 0947   GLUCOSEU Negative 11/07/2013 1115   HGBUR NEGATIVE 06/07/2019 0947   BILIRUBINUR MODERATE (A) 06/07/2019  [place] Negative 02/11/2015 1004   BILIRUBINUR Negative 11/07/2013 1115   [place] 06/07/2019 0947   PROTEINUR 30 (A) 06/07/2019 0947   NITRITE NEGATIVE 06/07/2019 0947   LEUKOCYTESUR NEGATIVE 06/07/2019 0947   LEUKOCYTESUR Negative 11/07/2013 1115   Sepsis Labs Invalid input(s): PROCALCITONIN,  WBC,  LACTICIDVEN Microbiology Recent Results (from the past 240 hour(s))  SARS Coronavirus 2 by RT PCR (hospital order, performed in [place] hospital lab) Nasopharyngeal Nasopharyngeal Swab     Status: None   Collection Time: 06/07/19 12:33 PM   Specimen: Nasopharyngeal Swab   Result Value Ref Range Status   SARS Coronavirus 2 NEGATIVE NEGATIVE Final    Comment: (NOTE) SARS-CoV-2 target nucleic acids are NOT DETECTED. The SARS-CoV-2 RNA is generally detectable in upper and lower respiratory specimens during the acute phase of infection. The lowest concentration of SARS-CoV-2 viral copies this assay can detect is 250 copies / mL. A negative result does not preclude SARS-CoV-2 infection and should not be used as the sole basis for treatment or other patient management decisions.  A negative result may occur with improper specimen collection / handling, submission of specimen other than nasopharyngeal swab, presence of viral mutation(s) within the areas targeted by this assay, and inadequate number of viral copies (<250 copies / mL). A negative result must be combined with clinical observations, patient history, and epidemiological information. Fact Sheet for Patients:   StrictlyIdeas.no Fact Sheet for Healthcare Providers: BankingDealers.co.za This test is not yet approved or cleared  by the [place] FDA and has been authorized for detection and/or diagnosis of SARS-CoV-2 by FDA under an Emergency Use Authorization (EUA).  This EUA will remain in effect (meaning this test can be used) for the duration of the COVID-19 declaration under Section 564(b)(1) of the Act, 21 U.S.C. section 360bbb-3(b)(1), unless the authorization is terminated or revoked sooner. Performed at [facility], [address] [place], [place] [address]      SIGNED:   Charlynne Cousins, MD  [facility] 06/11/2019, 10:23 AM Pager   If 7PM-7AM, please contact night-coverage [email or web address] Password [email or web address]

## 2019-06-11 NOTE — Telephone Encounter (Signed)
Spoke to the patient he is going back to the hospital on 06/13/2019 for another stent.

## 2019-06-12 ENCOUNTER — Encounter: Payer: Self-pay | Admitting: Oncology

## 2019-06-13 ENCOUNTER — Ambulatory Visit: Payer: BC Managed Care – PPO | Admitting: Anesthesiology

## 2019-06-13 ENCOUNTER — Ambulatory Visit: Payer: BC Managed Care – PPO

## 2019-06-13 ENCOUNTER — Ambulatory Visit
Admission: RE | Admit: 2019-06-13 | Discharge: 2019-06-13 | Disposition: A | Discharge status: Home / Self Care | Payer: BC Managed Care – PPO | Attending: Gastroenterology | Admitting: Gastroenterology

## 2019-06-13 ENCOUNTER — Encounter: Payer: Self-pay | Admitting: Gastroenterology

## 2019-06-13 ENCOUNTER — Encounter
Admission: RE | Disposition: A | Discharge status: Home / Self Care | Payer: Self-pay | Source: Home / Self Care | Attending: Gastroenterology

## 2019-06-13 DIAGNOSIS — N189 Chronic kidney disease, unspecified: Secondary | ICD-10-CM | POA: Insufficient documentation

## 2019-06-13 DIAGNOSIS — F17210 Nicotine dependence, cigarettes, uncomplicated: Secondary | ICD-10-CM | POA: Diagnosis not present

## 2019-06-13 DIAGNOSIS — Z885 Allergy status to narcotic agent status: Secondary | ICD-10-CM | POA: Insufficient documentation

## 2019-06-13 DIAGNOSIS — T85858A Stenosis due to other internal prosthetic devices, implants and grafts, initial encounter: Secondary | ICD-10-CM | POA: Insufficient documentation

## 2019-06-13 DIAGNOSIS — Y831 Surgical operation with implant of artificial internal device as the cause of abnormal reaction of the patient, or of later complication, without mention of misadventure at the time of the procedure: Secondary | ICD-10-CM | POA: Insufficient documentation

## 2019-06-13 DIAGNOSIS — C787 Secondary malignant neoplasm of liver and intrahepatic bile duct: Secondary | ICD-10-CM | POA: Diagnosis not present

## 2019-06-13 DIAGNOSIS — C259 Malignant neoplasm of pancreas, unspecified: Secondary | ICD-10-CM | POA: Diagnosis not present

## 2019-06-13 DIAGNOSIS — Z515 Encounter for palliative care: Secondary | ICD-10-CM

## 2019-06-13 DIAGNOSIS — Z905 Acquired absence of kidney: Secondary | ICD-10-CM | POA: Diagnosis not present

## 2019-06-13 DIAGNOSIS — Z4659 Encounter for fitting and adjustment of other gastrointestinal appliance and device: Secondary | ICD-10-CM | POA: Diagnosis not present

## 2019-06-13 DIAGNOSIS — K831 Obstruction of bile duct: Secondary | ICD-10-CM

## 2019-06-13 DIAGNOSIS — Z7952 Long term (current) use of systemic steroids: Secondary | ICD-10-CM | POA: Diagnosis not present

## 2019-06-13 DIAGNOSIS — Z79899 Other long term (current) drug therapy: Secondary | ICD-10-CM | POA: Insufficient documentation

## 2019-06-13 DIAGNOSIS — M1990 Unspecified osteoarthritis, unspecified site: Secondary | ICD-10-CM | POA: Insufficient documentation

## 2019-06-13 DIAGNOSIS — C250 Malignant neoplasm of head of pancreas: Secondary | ICD-10-CM | POA: Diagnosis not present

## 2019-06-13 DIAGNOSIS — I129 Hypertensive chronic kidney disease with stage 1 through stage 4 chronic kidney disease, or unspecified chronic kidney disease: Secondary | ICD-10-CM | POA: Diagnosis not present

## 2019-06-13 DIAGNOSIS — Z85528 Personal history of other malignant neoplasm of kidney: Secondary | ICD-10-CM | POA: Insufficient documentation

## 2019-06-13 HISTORY — PX: ERCP: SHX5425

## 2019-06-13 SURGERY — ERCP, WITH INTERVENTION IF INDICATED
Anesthesia: General

## 2019-06-13 MED ORDER — LACTATED RINGERS IV SOLN: Freq: Once | INTRAVENOUS | Status: AC

## 2019-06-13 MED ORDER — LIDOCAINE HCL (CARDIAC) PF 100 MG/5ML IV SOSY
PREFILLED_SYRINGE | INTRAVENOUS | Status: DC | PRN
  Administered 2019-06-13: 50 mg via INTRAVENOUS

## 2019-06-13 MED ORDER — MIDAZOLAM HCL 2 MG/2ML IJ SOLN
INTRAMUSCULAR | Status: AC
  Filled 2019-06-13: qty 2

## 2019-06-13 MED ORDER — PROPOFOL 500 MG/50ML IV EMUL
INTRAVENOUS | Status: DC | PRN
  Administered 2019-06-13: 150 ug/kg/min via INTRAVENOUS

## 2019-06-13 MED ORDER — HEPARIN SOD (PORK) LOCK FLUSH 100 UNIT/ML IV SOLN: 100.0000 [IU] | INTRAVENOUS | Status: DC | PRN

## 2019-06-13 MED ORDER — SODIUM CHLORIDE 0.9% FLUSH: 10.0000 mL | INTRAVENOUS | Status: DC | PRN

## 2019-06-13 MED ORDER — SODIUM CHLORIDE 0.9 % IV SOLN: INTRAVENOUS | Status: DC | PRN

## 2019-06-13 MED ORDER — HEPARIN SOD (PORK) LOCK FLUSH 100 UNIT/ML IV SOLN
INTRAVENOUS | Status: AC
  Filled 2019-06-13: qty 5

## 2019-06-13 MED ORDER — MIDAZOLAM HCL 2 MG/2ML IJ SOLN
INTRAMUSCULAR | Status: DC | PRN
  Administered 2019-06-13: 2 mg via INTRAVENOUS

## 2019-06-13 MED ORDER — PROPOFOL 500 MG/50ML IV EMUL
INTRAVENOUS | Status: AC
  Filled 2019-06-13: qty 50

## 2019-06-13 MED ORDER — PROPOFOL 10 MG/ML IV BOLUS
INTRAVENOUS | Status: DC | PRN
  Administered 2019-06-13: 60 mg via INTRAVENOUS
  Administered 2019-06-13: 20 mg via INTRAVENOUS

## 2019-06-13 NOTE — H&P (Signed)
Lucilla Lame, MD [facility] [address] [place], [place] [address] Phone:[phone] Fax : [phone]  Primary Care Physician:  Olin Hauser, DO Primary Gastroenterologist:  Dr. Allen Norris  Pre-Procedure History & Physical: HPI:  Kenneth Maxwell is a 64 y.o. male is here for an ERCP.   Past Medical History:  Diagnosis Date  . Alcohol abuse   . Arthritis   . Blood clotting disorder [place])   . Chronic renal disease   . Encounter for antineoplastic chemotherapy 05/24/2019  . H/O urinary retention   . Hematuria, gross   . Hypertension   . Pancreatic cancer metastasized to liver ([place]) 04/2019  . Renal cell carcinoma ([place]) 11/2013   kidney cancer s/p R nephrectomy    Past Surgical History:  Procedure Laterality Date  . ERCP N/A 04/28/2019   Procedure: ENDOSCOPIC RETROGRADE CHOLANGIOPANCREATOGRAPHY (ERCP);  Surgeon: Lucilla Lame, MD;  Location: [facility] ENDOSCOPY;  Service: Endoscopy;  Laterality: N/A;  . kidney removed Right   . liver stent Right   . NEPHRECTOMY RADICAL Right 11/2013   R side kidney cancer surgery, non adrenal sparing  . PORTA CATH INSERTION N/A 05/20/2019   Procedure: PORTA CATH INSERTION;  Surgeon: Algernon Huxley, MD;  Location: [facility] LAB;  Service: Cardiovascular;  Laterality: N/A;    Prior to Admission medications   Medication Sig Start Date End Date Taking? Authorizing Provider  amLODipine (NORVASC) 10 MG tablet Take 1 tablet (10 mg total) by mouth daily. 09/13/18  Yes Karamalegos, Devonne Doughty, DO  lidocaine-prilocaine (EMLA) cream Apply to affected area once Patient taking differently: Apply 1 application topically. Use before Chemotherapy treatments. 05/17/19  Yes Earlie Server, MD  losartan (COZAAR) 100 MG tablet Take 1 tablet (100 mg total) by mouth daily. 09/13/18  Yes Karamalegos, Devonne Doughty, DO  omeprazole (PRILOSEC) 20 MG capsule Take 1 capsule (20 mg total) by mouth 2 (two) times daily before a meal. 05/27/19  Yes Earlie Server, MD    oxyCODONE (ROXICODONE) 5 MG immediate release tablet Take 1 tablet (5 mg total) by mouth every 6 (six) hours as needed for severe pain. 06/06/19  Yes Earlie Server, MD  prochlorperazine (COMPAZINE) 10 MG tablet Take 1 tablet (10 mg total) by mouth every 6 (six) hours as needed (Nausea or vomiting). 05/17/19  Yes Earlie Server, MD  spironolactone (ALDACTONE) 25 MG tablet Take 1 tablet (25 mg total) by mouth daily. 04/30/19  Yes Mercy Riding, MD  dexamethasone (DECADRON) 4 MG tablet Take 2 tablets (8 mg total) by mouth daily. Start the day after chemotherapy for 3 days. Take with food. 05/24/19   Earlie Server, MD  loperamide (IMODIUM A-D) 2 MG tablet Take 2 at onset of diarrhea, then 1 every 2hrs until 12hr without a BM. May take 2 tab every 4hrs at bedtime. If diarrhea recurs repeat. 05/17/19   Earlie Server, MD  Multiple Vitamin (MULTIVITAMIN WITH MINERALS) TABS tablet Take 1 tablet by mouth daily. 04/30/19   Mercy Riding, MD  Polyethylene Glycol 3350 (MIRALAX PO) Take by mouth. BID    [provider]  senna (SENOKOT) 8.6 MG TABS tablet Take 2 tablets (17.2 mg total) by mouth daily. 05/31/19   Earlie Server, MD    Allergies as of 06/10/2019 - Review Complete 06/08/2019  Allergen Reaction Noted  . Codeine Rash and Other (See Comments) 09/13/2018    Family History  Problem Relation Age of Onset  . Hypertension Mother   . Diabetes Mother   . Hypertension  Father   . Heart attack Father   . Cancer Neg Hx     Social History   Socioeconomic History  . Marital status: Married    Spouse name: Not on file  . Number of children: Not on file  . Years of education: Not on file  . Highest education level: Not on file  Occupational History  . Occupation: welder  Tobacco Use  . Smoking status: Current Every Day Smoker    Packs/day: 1.00    Years: 50.00    Pack years: 50.00    Types: Cigarettes  . Smokeless tobacco: Never Used  . Tobacco comment: down to >0.5ppd  Vaping Use  . Vaping Use: Never used   Substance and Sexual Activity  . Alcohol use: Not Currently    Alcohol/week: 49.0 standard drinks    Types: 49 Cans of beer per week    Comment: h/o heavy use, quit about 3 months ago (before diagnosis)  . Drug use: Yes    Types: Marijuana  . Sexual activity: Yes  Other Topics Concern  . Not on file  Social History Narrative  . Not on file   Social Determinants of Health   Financial Resource Strain:   . Difficulty of Paying Living Expenses:   Food Insecurity:   . Worried About Charity fundraiser in the Last Year:   . Arboriculturist in the Last Year:   Transportation Needs:   . Film/video editor (Medical):   [place] Kitchen Lack of Transportation (Non-Medical):   Physical Activity:   . Days of Exercise per Week:   . Minutes of Exercise per Session:   Stress:   . Feeling of Stress :   Social Connections:   . Frequency of Communication with Friends and Family:   . Frequency of Social Gatherings with Friends and Family:   . Attends Religious Services:   . Active Member of Clubs or Organizations:   . Attends Archivist Meetings:   Marland Kitchen Marital Status:   Intimate Partner Violence:   . Fear of Current or Ex-Partner:   . Emotionally Abused:   [place] Physically Abused:   . Sexually Abused:     Review of Systems: See HPI, otherwise negative ROS  Physical Exam: BP 95/63   Pulse 88   Temp (!) 97 F (36.1 C) (Temporal)   Resp 20   Ht 5\' 11"  (1.803 m)   Wt 75.8 kg   SpO2 98%   BMI 23.31 kg/m  General:   Alert,  pleasant and cooperative in NAD positive jaundice Head:  Normocephalic and atraumatic. Neck:  Supple; no masses or thyromegaly. Lungs:  Clear throughout to auscultation.    Heart:  Regular rate and rhythm. Abdomen:  Soft, nontender and nondistended. Normal bowel sounds, without guarding, and without rebound.   Neurologic:  Alert and  oriented x4;  grossly normal neurologically.  Impression/Plan: Kenneth Maxwell is here for an ERCP to be performed for biliary  stent change  Risks, benefits, limitations, and alternatives regarding  ERCP have been reviewed with the patient.  Questions have been answered.  All parties agreeable.   Lucilla Lame, MD  06/13/2019, 11:58 AM

## 2019-06-13 NOTE — Transfer of Care (Signed)
Immediate Anesthesia Transfer of Care Note  Patient: Kenneth Maxwell  Procedure(s) Performed: ENDOSCOPIC RETROGRADE CHOLANGIOPANCREATOGRAPHY (ERCP) (N/A )  Patient Location: PACU  Anesthesia Type:General  Level of Consciousness: awake, alert  and oriented  Airway & Oxygen Therapy: Patient Spontanous Breathing  Post-op Assessment: Report given to RN and Post -op Vital signs reviewed and stable  Post vital signs: Reviewed and stable  Last Vitals:  Vitals Value Taken Time  BP 102/62 06/13/19 1238  Temp    Pulse 93 06/13/19 1238  Resp 21 06/13/19 1238  SpO2 98 % 06/13/19 1238    Last Pain:  Vitals:   06/13/19 1031  TempSrc: Temporal         Complications: No complications documented.

## 2019-06-13 NOTE — Op Note (Signed)
[facility] Gastroenterology Patient Name: Hazim Treadway Procedure Date: 06/13/2019 12:05 PM MRN: [id number] Account #: [id number] Date of Birth: 1955-07-11 Admit Type: Inpatient Age: 64 Room: [facility] ENDO ROOM 4 Gender: Male Note Status: Finalized Procedure:             ERCP Indications:           Malignant tumor of the head of pancreas Providers:             Lucilla Lame MD, MD Referring MD:          Olin Hauser (Referring MD) Medicines:             Propofol per Anesthesia Complications:         No immediate complications. Procedure:             Pre-Anesthesia Assessment:                        - Prior to the procedure, a History and Physical was                         performed, and patient medications and allergies were                         reviewed. The patient's tolerance of previous                         anesthesia was also reviewed. The risks and benefits                         of the procedure and the sedation options and risks                         were discussed with the patient. All questions were                         answered, and informed consent was obtained. Prior                         Anticoagulants: The patient has taken no previous                         anticoagulant or antiplatelet agents. ASA Grade                         Assessment: III - A patient with severe systemic                         disease. After reviewing the risks and benefits, the                         patient was deemed in satisfactory condition to                         undergo the procedure.                        After obtaining informed consent, the scope was passed  under direct vision. Throughout the procedure, the                         patient's blood pressure, pulse, and oxygen                         saturations were monitored continuously. The was                         introduced through the mouth, and used to inject                          contrast into and used to inject contrast into the                         bile duct. The ERCP was accomplished without                         difficulty. The patient tolerated the procedure well. Findings:      A biliary stent was visible on the scout film. One plastic stent       originating in the biliary tree was emerging from the major papilla. The       stent was visibly occluded. One stent was removed from the biliary tree       using a snare. A wire was passed into the biliary tree. The bile duct       was deeply cannulated with the short-nosed traction sphincterotome.       Contrast was injected. I personally interpreted the bile duct images.       There was brisk flow of contrast through the ducts. Image quality was       excellent. Contrast extended to the entire biliary tree. The main bile       duct was markedly dilated. The lower third of the main bile duct       contained a single stenosis. The biliary tree was swept with a 15 mm       balloon starting at the bifurcation. Sludge was swept from the duct. One       10 Fr by 6 cm covered metal stent was placed 5 cm into the common bile       duct. Bile flowed through the stent. The stent was in good position. Impression:            - One visibly occluded stent from the biliary tree was                         seen in the major papilla.                        - A single biliary stricture was found in the lower                         third of the main bile duct. The stricture was                         malignant appearing.                        - The entire main bile  duct was markedly dilated.                        - One stent was removed from the biliary tree.                        - The biliary tree was swept and sludge was found.                        - One covered metal stent was placed into the common                         bile duct. Recommendation:        - Discharge patient to home.                         - Resume previous diet.                        - Continue present medications. Procedure Code(s):     --- Professional ---                        [phone], Endoscopic retrograde cholangiopancreatography                         (ERCP); with removal and exchange of stent(s), biliary                         or pancreatic duct, including pre- and post-dilation                         and guide wire passage, when performed, including                         sphincterotomy, when performed, each stent exchanged                        [id number], Endoscopic retrograde cholangiopancreatography                         (ERCP); with removal of calculi/debris from                         biliary/pancreatic duct(s)                        [id number], Endoscopic catheterization of the biliary                         ductal system, radiological supervision and                         interpretation Diagnosis Code(s):     --- Professional ---                        C25.0, Malignant neoplasm of head of pancreas                        K83.1, Obstruction of bile duct  Z46.59, Encounter for fitting and adjustment of other                         gastrointestinal appliance and device CPT copyright 2019 [employer]. All rights reserved. The codes documented in this report are preliminary and upon coder review may  be revised to meet current compliance requirements. Lucilla Lame MD, MD 06/13/2019 12:36:50 PM This report has been signed electronically. Number of Addenda: 0 Note Initiated On: 06/13/2019 12:05 PM Estimated Blood Loss:  Estimated blood loss: none.      [facility]

## 2019-06-13 NOTE — Anesthesia Preprocedure Evaluation (Signed)
Anesthesia Evaluation  Patient identified by MRN, date of birth, ID band Patient awake    Reviewed: Allergy & Precautions, H&P , NPO status , Patient's Chart, lab work & pertinent test results, reviewed documented beta blocker date and time   History of Anesthesia Complications Negative for: history of anesthetic complications  Airway Mallampati: II  TM Distance: >3 FB Neck ROM: full    Dental  (+) Poor Dentition, Chipped, Missing, Dental Advidsory Given Very poor dentition, denies loose teeth:   Pulmonary neg shortness of breath, Current Smoker and Patient abstained from smoking.,    Pulmonary exam normal        Cardiovascular Exercise Tolerance: Good hypertension, (-) angina(-) Past MI and (-) DOE Normal cardiovascular exam  1 AVb and diffuse ST changes on EKG. No report regarding ischemia   Neuro/Psych PSYCHIATRIC DISORDERS negative neurological ROS     GI/Hepatic negative GI ROS, Neg liver ROS,   Endo/Other  negative endocrine ROS  Renal/GU Renal disease  negative genitourinary   Musculoskeletal  (+) Arthritis ,   Abdominal   Peds  Hematology negative hematology ROS (+)   Anesthesia Other Findings Patient is NPO appropriate and reports no nausea or vomiting today.  Past Medical History: No date: Alcohol abuse No date: Arthritis No date: Blood clotting disorder (HCC) No date: Chronic renal disease No date: H/O urinary retention No date: Hematuria, gross No date: Hypertension 11/2013: Renal cell carcinoma ([place])     Comment:  kidney cancer s/p R nephrectomy No date: Right renal mass  Past Surgical History: 11/2013: NEPHRECTOMY RADICAL; Right     Comment:  R side kidney cancer surgery, non adrenal sparing  BMI    Body Mass Index: 24.97 kg/m      Reproductive/Obstetrics negative OB ROS                             Anesthesia Physical  Anesthesia Plan  ASA: IV and  emergent  Anesthesia Plan: General   Post-op Pain Management:    Induction: Intravenous  PONV Risk Score and Plan: Treatment may vary due to age or medical condition and TIVA  Airway Management Planned: Nasal Cannula and Natural Airway  Additional Equipment:   Intra-op Plan:   Post-operative Plan:   Informed Consent: I have reviewed the patients History and Physical, chart, labs and discussed the procedure including the risks, benefits and alternatives for the proposed anesthesia with the patient or authorized representative who has indicated his/her understanding and acceptance.     Dental Advisory Given  Plan Discussed with: CRNA  Anesthesia Plan Comments: (Poss ETT)        Anesthesia Quick Evaluation

## 2019-06-13 NOTE — Anesthesia Procedure Notes (Signed)
Date/Time: 06/13/2019 12:12 PM Performed by: Johnna Acosta, CRNA Pre-anesthesia Checklist: Patient identified, Emergency Drugs available, Suction available, Patient being monitored and Timeout performed Patient Re-evaluated:Patient Re-evaluated prior to induction Oxygen Delivery Method: Nasal cannula Preoxygenation: Pre-oxygenation with 100% oxygen Induction Type: IV induction

## 2019-06-13 NOTE — Progress Notes (Signed)
C/o abd and upper abd pain upon arrival at check in desk. Assisted pt. To stretcher and supine. States pain is better lying down.

## 2019-06-14 ENCOUNTER — Encounter: Payer: Self-pay | Admitting: Gastroenterology

## 2019-06-14 ENCOUNTER — Other Ambulatory Visit: Payer: Self-pay

## 2019-06-14 ENCOUNTER — Other Ambulatory Visit: Payer: Self-pay | Admitting: Oncology

## 2019-06-14 DIAGNOSIS — K831 Obstruction of bile duct: Secondary | ICD-10-CM

## 2019-06-14 DIAGNOSIS — C259 Malignant neoplasm of pancreas, unspecified: Secondary | ICD-10-CM

## 2019-06-14 NOTE — Telephone Encounter (Signed)
Please arrange him to get LFT on 6/14. Plan Lab MD same chemo any day next week.

## 2019-06-14 NOTE — Anesthesia Postprocedure Evaluation (Signed)
Anesthesia Post Note  Patient: Kenneth Maxwell  Procedure(s) Performed: ENDOSCOPIC RETROGRADE CHOLANGIOPANCREATOGRAPHY (ERCP) (N/A )  Patient location during evaluation: Endoscopy Anesthesia Type: General Level of consciousness: awake and alert Pain management: pain level controlled Vital Signs Assessment: post-procedure vital signs reviewed and stable Respiratory status: spontaneous breathing, nonlabored ventilation, respiratory function stable and patient connected to nasal cannula oxygen Cardiovascular status: blood pressure returned to baseline and stable Postop Assessment: no apparent nausea or vomiting Anesthetic complications: no   No complications documented.   Last Vitals:  Vitals:   06/13/19 1300 06/13/19 1303  BP: 106/66   Pulse: 83 84  Resp: (!) 21 (!) 21  Temp:    SpO2: 98% 97%    Last Pain:  Vitals:   06/14/19 0736  TempSrc:   PainSc: 0-No pain                 Precious Haws Jasean Ambrosia

## 2019-06-14 NOTE — Telephone Encounter (Signed)
Done..  All appts has been scheduled as requested. Called pt and made him aware of all sched appts dates and times.

## 2019-06-14 NOTE — Telephone Encounter (Signed)
Please schedule as MD requests and notify pt of appt details.  Tx is folfirinox; dc pump with onpro on day 4

## 2019-06-17 ENCOUNTER — Inpatient Hospital Stay: Payer: BC Managed Care – PPO

## 2019-06-17 ENCOUNTER — Inpatient Hospital Stay: Payer: BC Managed Care – PPO | Admitting: Hospice and Palliative Medicine

## 2019-06-19 ENCOUNTER — Encounter: Payer: Self-pay | Admitting: Oncology

## 2019-06-19 ENCOUNTER — Inpatient Hospital Stay (HOSPITAL_BASED_OUTPATIENT_CLINIC_OR_DEPARTMENT_OTHER): Payer: BC Managed Care – PPO | Admitting: Hospice and Palliative Medicine

## 2019-06-19 ENCOUNTER — Inpatient Hospital Stay: Payer: BC Managed Care – PPO | Admitting: Hospice and Palliative Medicine

## 2019-06-19 ENCOUNTER — Telehealth: Payer: Self-pay | Admitting: *Deleted

## 2019-06-19 ENCOUNTER — Other Ambulatory Visit: Payer: Self-pay

## 2019-06-19 ENCOUNTER — Inpatient Hospital Stay: Payer: BC Managed Care – PPO

## 2019-06-19 ENCOUNTER — Inpatient Hospital Stay (HOSPITAL_BASED_OUTPATIENT_CLINIC_OR_DEPARTMENT_OTHER): Payer: BC Managed Care – PPO | Admitting: Oncology

## 2019-06-19 VITALS — BP 113/95 | HR 94 | Temp 96.7°F | Resp 18 | Wt 168.1 lb

## 2019-06-19 DIAGNOSIS — C259 Malignant neoplasm of pancreas, unspecified: Secondary | ICD-10-CM

## 2019-06-19 DIAGNOSIS — K831 Obstruction of bile duct: Secondary | ICD-10-CM | POA: Diagnosis not present

## 2019-06-19 DIAGNOSIS — Z515 Encounter for palliative care: Secondary | ICD-10-CM

## 2019-06-19 DIAGNOSIS — Z7189 Other specified counseling: Secondary | ICD-10-CM | POA: Diagnosis not present

## 2019-06-19 DIAGNOSIS — G4700 Insomnia, unspecified: Secondary | ICD-10-CM

## 2019-06-19 DIAGNOSIS — G893 Neoplasm related pain (acute) (chronic): Secondary | ICD-10-CM | POA: Diagnosis not present

## 2019-06-19 LAB — CBC WITH DIFFERENTIAL/PLATELET
Abs Immature Granulocytes: 4.95 10*3/uL — ABNORMAL HIGH (ref 0.00–0.07)
Basophils Absolute: 0.1 10*3/uL (ref 0.0–0.1)
Basophils Relative: 0 %
Eosinophils Absolute: 0.1 10*3/uL (ref 0.0–0.5)
Eosinophils Relative: 1 %
HCT: 30.6 % — ABNORMAL LOW (ref 39.0–52.0)
Hemoglobin: 10.4 g/dL — ABNORMAL LOW (ref 13.0–17.0)
Immature Granulocytes: 17 %
Lymphocytes Relative: 9 %
Lymphs Abs: 2.6 10*3/uL (ref 0.7–4.0)
MCH: 30.7 pg (ref 26.0–34.0)
MCHC: 34 g/dL (ref 30.0–36.0)
MCV: 90.3 fL (ref 80.0–100.0)
Monocytes Absolute: 2.8 10*3/uL — ABNORMAL HIGH (ref 0.1–1.0)
Monocytes Relative: 10 %
Neutro Abs: 18.1 10*3/uL — ABNORMAL HIGH (ref 1.7–7.7)
Neutrophils Relative %: 63 %
Platelets: 736 10*3/uL — ABNORMAL HIGH (ref 150–400)
RBC: 3.39 MIL/uL — ABNORMAL LOW (ref 4.22–5.81)
RDW: 16.2 % — ABNORMAL HIGH (ref 11.5–15.5)
Smear Review: INCREASED
WBC: 28.6 10*3/uL — ABNORMAL HIGH (ref 4.0–10.5)
nRBC: 0 % (ref 0.0–0.2)

## 2019-06-19 LAB — COMPREHENSIVE METABOLIC PANEL
ALT: 72 U/L — ABNORMAL HIGH (ref 0–44)
AST: 29 U/L (ref 15–41)
Albumin: 2.5 g/dL — ABNORMAL LOW (ref 3.5–5.0)
Alkaline Phosphatase: 556 U/L — ABNORMAL HIGH (ref 38–126)
Anion gap: 10 (ref 5–15)
BUN: 11 mg/dL (ref 8–23)
CO2: 24 mmol/L (ref 22–32)
Calcium: 8.6 mg/dL — ABNORMAL LOW (ref 8.9–10.3)
Chloride: 97 mmol/L — ABNORMAL LOW (ref 98–111)
Creatinine, Ser: 0.95 mg/dL (ref 0.61–1.24)
GFR calc Af Amer: >60 mL/min (ref >60)
GFR calc non Af Amer: >60 mL/min (ref >60)
Glucose, Bld: 108 mg/dL — ABNORMAL HIGH (ref 70–99)
Potassium: 3.9 mmol/L (ref 3.5–5.1)
Sodium: 131 mmol/L — ABNORMAL LOW (ref 135–145)
Total Bilirubin: 4.2 mg/dL — ABNORMAL HIGH (ref 0.3–1.2)
Total Protein: 7.3 g/dL (ref 6.5–8.1)

## 2019-06-19 MED ORDER — OXYCODONE HCL 5 MG PO TABS: 5.0000 mg | ORAL_TABLET | Freq: Four times a day (QID) | ORAL | 0 refills | Status: DC | PRN

## 2019-06-19 MED ORDER — TRAZODONE HCL 50 MG PO TABS: 25.0000 mg | ORAL_TABLET | Freq: Every evening | ORAL | 1 refills | Status: DC | PRN

## 2019-06-19 NOTE — Progress Notes (Signed)
Hematology/Oncology Follow Up Note [facility]  [phone] [phone]  Patient Care Team: Olin Hauser, DO as PCP - General (Family Medicine) Clent Jacks, RN as Oncology Nurse Navigator   Name of the patient: Kenneth Maxwell  [id number]  07-30-1955   REASON FOR VISIT  follow-up for pancreatic cancer  PERTINENT ONCOLOGY HISTORY Kenneth Maxwell is a 64 y.o[place] who has above oncology history reviewed by me today presented for follow up visit for management of metastatic pancreatic cancer 64 y.o. male presents for management of pancreatic cancer. Patient has a remote history of RCC status post right nephrectomy in 11/23/2018. Anxiety, alcohol abuse, tobacco abuse. Patient was admitted from 04/26/2019-04/29/2019 due to painless jaundice, generalized weakness. Bilirubin was elevated to 10, right upper quadrant ultrasound showed 3.6 cm hypoechoic mass along the posterior margin of the left liver lobe.  Gallbladder sludge.  Dilated CBD. MRCP showed hypoenhancing mass of the pancreatic head measuring about 3.6 cm, obstructing CBD near the ampulla consistent with pancreatic adenocarcinoma.  Hypoenhancing metastasis of liver and cholelithiasis. Patient was seen by gastroenterology.  Had a ERCP on 04/28/2019 which revealed a duodenal mass, single localized biliary stricture in the lower third of the main bile duct and dilated entire main bile duct.  A biliary sphincterotomy was performed.  Biliary stent was placed into the common bile duct.  Biopsy was performed. Unfortunately biopsy was not diagnostic.  Patient's bilirubin trended down after procedure.  05/10/2019 he underwent ultrasound-guided left liver mass biopsy and the pathology showed metastatic adenocarcinoma, consistent with pancreati biliary origin.  Today patient was accompanied by his wife.  Patient has called earlier this week reporting abdominal pain which was not improved by  taking Tylenol or NSAIDs.  Patient was prescribed with oxycodone 5 mg every 6 hours as needed.  He reports pain is better controlled. He is constipated.  Last bowel movement 3 days ago.  # INTERVAL HISTORY 64 y.o. male presents for management of stage IV pancreatic cancer Status post cycle 1 modified FOLFIRINOX. During last visit, patient developed obstructive jaundice again.  Patient has had a repeat ERCP and had a permanent.  Stent placed.  Today he feels well.  Abdominal pain has improved. He was accompanied by daughter, and wife. His appetite is fair.  Fatigue level has improved.  Denies any abdominal pain today.  Review of Systems  Constitutional: Positive for fatigue. Negative for appetite change, chills, fever and unexpected weight change.  HENT:   Negative for hearing loss and voice change.   Eyes: Negative for eye problems and icterus.  Respiratory: Negative for chest tightness, cough and shortness of breath.   Cardiovascular: Negative for chest pain and leg swelling.  Gastrointestinal: Negative for abdominal distention, abdominal pain and constipation.  Endocrine: Negative for hot flashes.  Genitourinary: Negative for difficulty urinating, dysuria and frequency.   Musculoskeletal: Negative for arthralgias.  Skin: Negative for itching and rash.  Neurological: Negative for light-headedness and numbness.  Hematological: Negative for adenopathy. Does not bruise/bleed easily.  Psychiatric/Behavioral: Negative for confusion.      Allergies  Allergen Reactions  . Codeine Rash and Other (See Comments)     Past Medical History:  Diagnosis Date  . Alcohol abuse   . Arthritis   . Blood clotting disorder ([place])   . Chronic renal disease   . Encounter for antineoplastic chemotherapy 05/24/2019  . H/O urinary retention   . Hematuria, gross   . Hypertension   . Pancreatic cancer metastasized  to liver ([place]) 04/2019  . Renal cell carcinoma ([place]) 11/2013   kidney cancer s/p R  nephrectomy     Past Surgical History:  Procedure Laterality Date  . ERCP N/A 04/28/2019   Procedure: ENDOSCOPIC RETROGRADE CHOLANGIOPANCREATOGRAPHY (ERCP);  Surgeon: Lucilla Lame, MD;  Location: [facility] ENDOSCOPY;  Service: Endoscopy;  Laterality: N/A;  . ERCP N/A 06/13/2019   Procedure: ENDOSCOPIC RETROGRADE CHOLANGIOPANCREATOGRAPHY (ERCP);  Surgeon: Lucilla Lame, MD;  Location: [facility] ENDOSCOPY;  Service: Endoscopy;  Laterality: N/A;  . kidney removed Right   . liver stent Right   . NEPHRECTOMY RADICAL Right 11/2013   R side kidney cancer surgery, non adrenal sparing  . PORTA CATH INSERTION N/A 05/20/2019   Procedure: PORTA CATH INSERTION;  Surgeon: Algernon Huxley, MD;  Location: [facility];  Service: Cardiovascular;  Laterality: N/A;    Social History   Socioeconomic History  . Marital status: Married    Spouse name: Not on file  . Number of children: Not on file  . Years of education: Not on file  . Highest education level: Not on file  Occupational History  . Occupation: welder  Tobacco Use  . Smoking status: Current Every Day Smoker    Packs/day: 1.00    Years: 50.00    Pack years: 50.00    Types: Cigarettes  . Smokeless tobacco: Never Used  . Tobacco comment: down to >0.5ppd  Vaping Use  . Vaping Use: Never used  Substance and Sexual Activity  . Alcohol use: Not Currently    Alcohol/week: 49.0 standard drinks    Types: 49 Cans of beer per week    Comment: h/o heavy use, quit about 3 months ago (before diagnosis)  . Drug use: Yes    Types: Marijuana  . Sexual activity: Yes  Other Topics Concern  . Not on file  Social History Narrative  . Not on file   Social Determinants of Health   Financial Resource Strain:   . Difficulty of Paying Living Expenses:   Food Insecurity:   . Worried About Charity fundraiser in the Last Year:   . Arboriculturist in the Last Year:   Transportation Needs:   . Film/video editor (Medical):   Marland Kitchen Lack of Transportation  (Non-Medical):   Physical Activity:   . Days of Exercise per Week:   . Minutes of Exercise per Session:   Stress:   . Feeling of Stress :   Social Connections:   . Frequency of Communication with Friends and Family:   . Frequency of Social Gatherings with Friends and Family:   . Attends Religious Services:   . Active Member of Clubs or Organizations:   . Attends Archivist Meetings:   Marland Kitchen Marital Status:   Intimate Partner Violence:   . Fear of Current or Ex-Partner:   . Emotionally Abused:   Marland Kitchen Physically Abused:   . Sexually Abused:     Family History  Problem Relation Age of Onset  . Hypertension Mother   . Diabetes Mother   . Hypertension Father   . Heart attack Father   . Cancer Neg Hx      Current Outpatient Medications:  .  amLODipine (NORVASC) 10 MG tablet, Take 1 tablet (10 mg total) by mouth daily. (Patient not taking: Reported on 06/19/2019), Disp: 90 tablet, Rfl: 3 .  dexamethasone (DECADRON) 4 MG tablet, Take 2 tablets (8 mg total) by mouth daily. Start the day after chemotherapy for 3 days.  Take with food. (Patient not taking: Reported on 06/19/2019), Disp: 8 tablet, Rfl: 5 .  lidocaine-prilocaine (EMLA) cream, Apply to affected area once (Patient not taking: Reported on 06/19/2019), Disp: 30 g, Rfl: 3 .  loperamide (IMODIUM A-D) 2 MG tablet, Take 2 at onset of diarrhea, then 1 every 2hrs until 12hr without a BM. May take 2 tab every 4hrs at bedtime. If diarrhea recurs repeat. (Patient not taking: Reported on 06/19/2019), Disp: 100 tablet, Rfl: 1 .  losartan (COZAAR) 100 MG tablet, Take 1 tablet (100 mg total) by mouth daily. (Patient not taking: Reported on 06/19/2019), Disp: 90 tablet, Rfl: 3 .  Multiple Vitamin (MULTIVITAMIN WITH MINERALS) TABS tablet, Take 1 tablet by mouth daily., Disp:  , Rfl:  .  omeprazole (PRILOSEC) 20 MG capsule, Take 1 capsule (20 mg total) by mouth 2 (two) times daily before a meal., Disp: 60 capsule, Rfl: 0 .  oxyCODONE (ROXICODONE)  5 MG immediate release tablet, Take 1 tablet (5 mg total) by mouth every 6 (six) hours as needed for severe pain., Disp: 30 tablet, Rfl: 0 .  Polyethylene Glycol 3350 (MIRALAX PO), Take by mouth. BID (Patient not taking: Reported on 06/19/2019), Disp: , Rfl:  .  prochlorperazine (COMPAZINE) 10 MG tablet, Take 1 tablet (10 mg total) by mouth every 6 (six) hours as needed (Nausea or vomiting)., Disp: 30 tablet, Rfl: 1 .  senna (SENOKOT) 8.6 MG TABS tablet, Take 2 tablets (17.2 mg total) by mouth daily. (Patient not taking: Reported on 06/19/2019), Disp: 120 tablet, Rfl: 0 .  spironolactone (ALDACTONE) 25 MG tablet, Take 1 tablet (25 mg total) by mouth daily. (Patient not taking: Reported on 06/19/2019), Disp: 30 tablet, Rfl: 1 .  traZODone (DESYREL) 50 MG tablet, Take 0.5-1 tablets (25-50 mg total) by mouth at bedtime as needed for sleep., Disp: 30 tablet, Rfl: 1  Physical exam: Vital sign see [place]  Physical Exam Constitutional:      General: He is not in acute distress. HENT:     Head: Normocephalic and atraumatic.  Eyes:     General: Scleral icterus present.  Cardiovascular:     Rate and Rhythm: Normal rate and regular rhythm.     Heart sounds: Normal heart sounds.  Pulmonary:     Effort: Pulmonary effort is normal. No respiratory distress.     Breath sounds: No wheezing.  Abdominal:     General: Bowel sounds are normal. There is no distension.     Palpations: Abdomen is soft.  Musculoskeletal:        General: No deformity. Normal range of motion.     Cervical back: Normal range of motion and neck supple.  Skin:    General: Skin is warm and dry.     Coloration: Skin is not jaundiced.     Findings: No erythema or rash.  Neurological:     Mental Status: He is alert and oriented to person, place, and time. Mental status is at baseline.     Cranial Nerves: No cranial nerve deficit.     Coordination: Coordination normal.  Psychiatric:        Mood and Affect: Mood normal.     CMP  Latest Ref Rng & Units 06/19/2019  Glucose 70 - 99 mg/dL 108(H)  BUN 8 - 23 mg/dL 11  Creatinine 0.61 - 1.24 mg/dL 0.95  Sodium 135 - 145 mmol/L 131(L)  Potassium 3.5 - 5.1 mmol/L 3.9  Chloride 98 - 111 mmol/L 97(L)  CO2 22 - 32 mmol/L  24  Calcium 8.9 - 10.3 mg/dL 8.6(L)  Total Protein 6.5 - 8.1 g/dL 7.3  Total Bilirubin 0.3 - 1.2 mg/dL 4.2(H)  Alkaline Phos 38 - 126 U/L 556(H)  AST 15 - 41 U/L 29  ALT 0 - 44 U/L 72(H)   CBC Latest Ref Rng & Units 06/19/2019  WBC 4.0 - 10.5 K/uL 28.6(H)  Hemoglobin 13.0 - 17.0 g/dL 10.4(L)  Hematocrit 39 - 52 % 30.6(L)  Platelets 150 - 400 K/uL 736(H)    RADIOGRAPHIC STUDIES: I have personally reviewed the radiological images as listed and agreed with the findings in the report. CT ABDOMEN PELVIS W CONTRAST  Result Date: 06/08/2019 CLINICAL DATA:  Pancreatic cancer, elevated bilirubin, generalized weakness, chronic abdominal pain worse in past few days, prior RIGHT nephrectomy 6-7 years ago due to renal cell carcinoma, history hypertension, smoker, history alcohol abuse EXAM: CT ABDOMEN AND PELVIS WITH CONTRAST TECHNIQUE: Multidetector CT imaging of the abdomen and pelvis was performed using the standard protocol following bolus administration of intravenous contrast. Sagittal and coronal MPR images reconstructed from axial data set. CONTRAST:  12mL OMNIPAQUE IOHEXOL 300 MG/ML SOLN IV. Dilute oral contrast. COMPARISON:  07/30/2014 FINDINGS: Lower chest: Minimal dependent bibasilar atelectasis Hepatobiliary: CB stent identified, tip at ampulla and flange in distal CBD. CBD dilatation present up to 20 mm diameter, new. Intrahepatic biliary dilatation, new. Small calcified gallstone in gallbladder. Minimal gallbladder wall thickening. Hepatic metastatic lesions identified, lateral segment LEFT lobe 3.1 x 2.7 cm image 20 slightly increased. Additional lesion anterior RIGHT lobe more inferiorly 2.5 x 1.9 cm image 35. Potential additional small lesion more  posteriorly RIGHT lobe 12 mm diameter image 23. Pancreas: Enlargement of pancreatic head/body by tumor little changed. Necrotic peripancreatic lymph node 12 mm short axis image 38, minimally increased. Spleen: Normal appearance Adrenals/Urinary Tract: Stable LEFT adrenal mass 23 x 15 mm. Unremarkable LEFT kidney. Post RIGHT nephrectomy. Stomach/Bowel: Colon under distended with suboptimal assessment of wall thickness. Stomach and remaining bowel loops unremarkable. Vascular/Lymphatic: Atherosclerotic calcifications aorta, proximal visceral arteries, iliac arteries, coronary arteries. Aorta normal caliber. Borderline enlarged portal caval lymph node 10 mm short axis image 27. Reproductive: Minimal prostatic enlargement. Seminal vesicles unremarkable. Other: No free air or free fluid.  No hernia. Musculoskeletal: Osseous structures unremarkable. IMPRESSION: New biliary dilatation despite CB stent, the tip of which appears to be at the ampulla with the flange in the distal CBD, question occluded versus slightly displaced stent. Cholelithiasis. Unchanged appearance of pancreatic head/body neoplasm. Hepatic metastases, slightly increased in sizes since prior exam. Stable LEFT adrenal mass. Minimally increased size of necrotic peripancreatic lymph node. Aortic Atherosclerosis (ICD10-I70.0). Electronically Signed   By: Lavonia Dana M.D.   On: 06/08/2019 15:49   PERIPHERAL VASCULAR CATHETERIZATION  Result Date: 05/20/2019 See op note  DG C-Arm 1-60 Min-No Report  Result Date: 06/13/2019 Fluoroscopy was utilized by the requesting physician.  No radiographic interpretation.     Assessment and plan Patient is a 64 y.o. 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 1. Goals of care, counseling/discussion   2. Pancreatic adenocarcinoma ([place])   3. Insomnia, unspecified type   4.  Neoplasm related pain    #stage IV pancreatic cancer  #Obstructive jaundice, status post placement of permanent biliary stent. Patient had repeat blood work done today. Bilirubin is trending down. #Neoplasm related pain, currently his pain is better controlled.  He will occasionally need  to take oxycodone. He requested refill of his pain medication. #Goals of care, I had a lengthy discussion with patient today.  Patient expresses wishes of not to proceed with chemotherapy. He tells me that he values life quality over longevity.  We discussed about the medium overall survival for patient with stage IV pancreatic cancer on FOLFIRINOX is about 11 months.  Without treatment, life expectancy is about 3 to 6 months.  He may have increased pain, jaundice, liver failure etc.  Voices understanding and wishes not to proceed with additional treatments. I discussed with palliative care Vonna Kotyk.  Patient will be set up for home hospice. CODE STATUS DNR/DNI.  Earlie Server, MD, PhD Hematology Oncology [facility] Pager- [phone] 06/19/2019

## 2019-06-19 NOTE — Progress Notes (Signed)
[place]  [phone] [phone]   Name: Kenneth Maxwell Date: 06/19/2019 MRN: [id number]  DOB: March 03, 1955  Patient Care Team: Olin Hauser, DO as PCP - General (Family Medicine) Clent Jacks, RN as Oncology Nurse Navigator    REASON FOR CONSULTATION: Kenneth Maxwell is a 64 y.o. 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  Patient was hospitalized 04/26/2019 to 04/29/2019 with painless jaundice and generalized weakness.  Work-up revealed a large pancreatic mass obstructing the CBD with metastases to the liver.  Patient underwent biliary stenting.  Patient was started on FOLFIRINOX chemotherapy with palliative intent.  He was hospitalized 06/08/2019 to 06/11/2019 for obstructive jaundice but left AMA prior to having biliary stent exchanged.  Patient underwent biliary stent exchange on 06/13/2019.  He was referred to palliative care to help address goals and manage ongoing symptoms.  SOCIAL HISTORY:     reports that he has been smoking cigarettes. He has a 50.00 pack-year smoking history. He has never used smokeless tobacco. He reports previous alcohol use of about 49.0 standard drinks of alcohol per week. He reports current drug use. Drug: Marijuana.   Patient is married and lives at home with his wife. He has 3 children who lives on the same property. Patient worked as a Public affairs consultant.  ADVANCE DIRECTIVES:  Does not have  CODE STATUS: DNR/DNI (DNR form completed on 06/19/19)  PAST MEDICAL HISTORY: Past Medical History:  Diagnosis Date   Alcohol abuse    Arthritis    Blood clotting disorder ([place])    Chronic renal disease    Encounter for antineoplastic chemotherapy 05/24/2019   H/O urinary retention    Hematuria, gross      Hypertension    Pancreatic cancer metastasized to liver ([place]) 04/2019   Renal cell carcinoma ([place]) 11/2013   kidney cancer s/p R nephrectomy    PAST SURGICAL HISTORY:  Past Surgical History:  Procedure Laterality Date   ERCP N/A 04/28/2019   Procedure: ENDOSCOPIC RETROGRADE CHOLANGIOPANCREATOGRAPHY (ERCP);  Surgeon: Lucilla Lame, MD;  Location: [facility] ENDOSCOPY;  Service: Endoscopy;  Laterality: N/A;   ERCP N/A 06/13/2019   Procedure: ENDOSCOPIC RETROGRADE CHOLANGIOPANCREATOGRAPHY (ERCP);  Surgeon: Lucilla Lame, MD;  Location: [facility] ENDOSCOPY;  Service: Endoscopy;  Laterality: N/A;   kidney removed Right    liver stent Right    NEPHRECTOMY RADICAL Right 11/2013   R side kidney cancer surgery, non adrenal sparing   PORTA CATH INSERTION N/A 05/20/2019   Procedure: PORTA CATH INSERTION;  Surgeon: Algernon Huxley, MD;  Location: [place] CV LAB;  Service: Cardiovascular;  Laterality: N/A;    HEMATOLOGY/ONCOLOGY HISTORY:  Oncology History  Pancreatic adenocarcinoma ([place])  04/26/2019 Initial Diagnosis   Pancreatic adenocarcinoma ([place])   05/24/2019 -  Chemotherapy   The patient had palonosetron (ALOXI) injection 0.25 mg, 0.25 mg, Intravenous,  Once, 1 of 6 cycles Administration: 0.25 mg (05/24/2019) pegfilgrastim (NEULASTA ONPRO KIT) injection 6 mg, 6 mg, Subcutaneous, Once, 1 of 6 cycles Administration: 6 mg (05/27/2019) irinotecan (CAMPTOSAR) 300 mg in sodium chloride 0.9 % 500 mL chemo infusion, 150 mg/m2 = 300 mg, Intravenous,  Once, 1 of 6 cycles Administration: 300 mg (05/24/2019) oxaliplatin (ELOXATIN) 170 mg in dextrose 5 % 500 mL chemo infusion, 85 mg/m2 = 170 mg, Intravenous,  Once, 1 of 6 cycles Administration: 170 mg (  05/24/2019) fosaprepitant (EMEND) 150 mg in sodium chloride 0.9 % 145 mL IVPB, 150 mg, Intravenous,  Once, 1 of 6 cycles Administration: 150 mg (05/24/2019) fluorouracil (ADRUCIL) 4,850 mg in sodium chloride 0.9 % 53 mL chemo infusion, 2,400 mg/m2 = 4,850 mg,  Intravenous, 1 Day/Dose, 1 of 6 cycles Administration: 4,850 mg (05/24/2019) leucovorin 800 mg in sodium chloride 0.9 % 250 mL infusion, 808 mg, Intravenous,  Once, 1 of 6 cycles Administration: 800 mg (05/24/2019)  for chemotherapy treatment.      ALLERGIES:  is allergic to codeine.  MEDICATIONS:  Current Outpatient Medications  Medication Sig Dispense Refill   Multiple Vitamin (MULTIVITAMIN WITH MINERALS) TABS tablet Take 1 tablet by mouth daily.     omeprazole (PRILOSEC) 20 MG capsule Take 1 capsule (20 mg total) by mouth 2 (two) times daily before a meal. 60 capsule 0   oxyCODONE (ROXICODONE) 5 MG immediate release tablet Take 1 tablet (5 mg total) by mouth every 6 (six) hours as needed for severe pain. 30 tablet 0   prochlorperazine (COMPAZINE) 10 MG tablet Take 1 tablet (10 mg total) by mouth every 6 (six) hours as needed (Nausea or vomiting). 30 tablet 1   amLODipine (NORVASC) 10 MG tablet Take 1 tablet (10 mg total) by mouth daily. (Patient not taking: Reported on 06/19/2019) 90 tablet 3   dexamethasone (DECADRON) 4 MG tablet Take 2 tablets (8 mg total) by mouth daily. Start the day after chemotherapy for 3 days. Take with food. (Patient not taking: Reported on 06/19/2019) 8 tablet 5   lidocaine-prilocaine (EMLA) cream Apply to affected area once (Patient not taking: Reported on 06/19/2019) 30 g 3   loperamide (IMODIUM A-D) 2 MG tablet Take 2 at onset of diarrhea, then 1 every 2hrs until 12hr without a BM. May take 2 tab every 4hrs at bedtime. If diarrhea recurs repeat. (Patient not taking: Reported on 06/19/2019) 100 tablet 1   losartan (COZAAR) 100 MG tablet Take 1 tablet (100 mg total) by mouth daily. (Patient not taking: Reported on 06/19/2019) 90 tablet 3   Polyethylene Glycol 3350 (MIRALAX PO) Take by mouth. BID (Patient not taking: Reported on 06/19/2019)     senna (SENOKOT) 8.6 MG TABS tablet Take 2 tablets (17.2 mg total) by mouth daily. (Patient not taking: Reported on  06/19/2019) 120 tablet 0   spironolactone (ALDACTONE) 25 MG tablet Take 1 tablet (25 mg total) by mouth daily. (Patient not taking: Reported on 06/19/2019) 30 tablet 1   No current facility-administered medications for this visit.    VITAL SIGNS: BP (!) 113/95 (BP Location: Left Arm, Patient Position: Sitting)    Pulse 94    Temp (!) 96.7 F (35.9 C) (Tympanic)    Resp 18    Wt 168 lb 1.6 oz (76.2 kg)    SpO2 100%    BMI 23.45 kg/m  Filed Weights   06/19/19 1157  Weight: 168 lb 1.6 oz (76.2 kg)    Estimated body mass index is 23.45 kg/m as calculated from the following:   Height as of 06/13/19: '5\' 11"'$  (1.803 m).   Weight as of this encounter: 168 lb 1.6 oz (76.2 kg).  LABS: CBC:    Component Value Date/Time   WBC 16.8 (H) 06/09/2019 0256   HGB 11.5 (L) 06/09/2019 0256   HGB 13.5 11/13/2013 0422   HCT 32.9 (L) 06/09/2019 0256   HCT 40.5 11/13/2013 0422   PLT 254 06/09/2019 0256   PLT 185 11/13/2013 0422   MCV 86.6  06/09/2019 0256   MCV 93 11/13/2013 0422   NEUTROABS 14.1 (H) 06/08/2019 1755   NEUTROABS 8.4 (H) 11/13/2013 0422   LYMPHSABS 0.9 06/08/2019 1755   LYMPHSABS 0.9 (L) 11/13/2013 0422   MONOABS 1.4 (H) 06/08/2019 1755   MONOABS 1.0 11/13/2013 0422   EOSABS 0.0 06/08/2019 1755   EOSABS 0.0 11/13/2013 0422   BASOSABS 0.1 06/08/2019 1755   BASOSABS 0.0 11/13/2013 0422   Comprehensive Metabolic Panel:    Component Value Date/Time   NA 128 (L) 06/09/2019 0256   NA 140 11/13/2013 0422   K 3.5 06/09/2019 0256   K 3.6 11/13/2013 0422   CL 97 (L) 06/09/2019 0256   CL 104 11/13/2013 0422   CO2 20 (L) 06/09/2019 0256   CO2 30 11/13/2013 0422   BUN 6 (L) 06/09/2019 0256   BUN 9 11/13/2013 0422   CREATININE 0.87 06/09/2019 0256   CREATININE 1.38 (H) 04/25/2019 0920   GLUCOSE 91 06/09/2019 0256   GLUCOSE 90 11/13/2013 0422   CALCIUM 8.7 (L) 06/09/2019 0256   CALCIUM 7.9 (L) 11/13/2013 0422   AST 119 (H) 06/08/2019 1755   AST 28 11/11/2013 1403   ALT 240 (H)  06/08/2019 1755   ALT 35 11/11/2013 1403   ALKPHOS 1,033 (H) 06/08/2019 1755   ALKPHOS 71 11/11/2013 1403   BILITOT 12.7 (H) 06/08/2019 1755   BILITOT 0.5 11/11/2013 1403   PROT 6.2 (L) 06/08/2019 1755   PROT 6.0 (L) 11/11/2013 1403   ALBUMIN 2.1 (L) 06/08/2019 1755   ALBUMIN 3.2 (L) 11/11/2013 1403    RADIOGRAPHIC STUDIES: CT ABDOMEN PELVIS W CONTRAST  Result Date: 06/08/2019 CLINICAL DATA:  Pancreatic cancer, elevated bilirubin, generalized weakness, chronic abdominal pain worse in past few days, prior RIGHT nephrectomy 6-7 years ago due to renal cell carcinoma, history hypertension, smoker, history alcohol abuse EXAM: CT ABDOMEN AND PELVIS WITH CONTRAST TECHNIQUE: Multidetector CT imaging of the abdomen and pelvis was performed using the standard protocol following bolus administration of intravenous contrast. Sagittal and coronal MPR images reconstructed from axial data set. CONTRAST:  34m OMNIPAQUE IOHEXOL 300 MG/ML SOLN IV. Dilute oral contrast. COMPARISON:  07/30/2014 FINDINGS: Lower chest: Minimal dependent bibasilar atelectasis Hepatobiliary: CB stent identified, tip at ampulla and flange in distal CBD. CBD dilatation present up to 20 mm diameter, new. Intrahepatic biliary dilatation, new. Small calcified gallstone in gallbladder. Minimal gallbladder wall thickening. Hepatic metastatic lesions identified, lateral segment LEFT lobe 3.1 x 2.7 cm image 20 slightly increased. Additional lesion anterior RIGHT lobe more inferiorly 2.5 x 1.9 cm image 35. Potential additional small lesion more posteriorly RIGHT lobe 12 mm diameter image 23. Pancreas: Enlargement of pancreatic head/body by tumor little changed. Necrotic peripancreatic lymph node 12 mm short axis image 38, minimally increased. Spleen: Normal appearance Adrenals/Urinary Tract: Stable LEFT adrenal mass 23 x 15 mm. Unremarkable LEFT kidney. Post RIGHT nephrectomy. Stomach/Bowel: Colon under distended with suboptimal assessment of wall  thickness. Stomach and remaining bowel loops unremarkable. Vascular/Lymphatic: Atherosclerotic calcifications aorta, proximal visceral arteries, iliac arteries, coronary arteries. Aorta normal caliber. Borderline enlarged portal caval lymph node 10 mm short axis image 27. Reproductive: Minimal prostatic enlargement. Seminal vesicles unremarkable. Other: No free air or free fluid.  No hernia. Musculoskeletal: Osseous structures unremarkable. IMPRESSION: New biliary dilatation despite CB stent, the tip of which appears to be at the ampulla with the flange in the distal CBD, question occluded versus slightly displaced stent. Cholelithiasis. Unchanged appearance of pancreatic head/body neoplasm. Hepatic metastases, slightly increased in sizes since prior  exam. Stable LEFT adrenal mass. Minimally increased size of necrotic peripancreatic lymph node. Aortic Atherosclerosis (ICD10-I70.0). Electronically Signed   By: Lavonia Dana M.D.   On: 06/08/2019 15:49   PERIPHERAL VASCULAR CATHETERIZATION  Result Date: 05/20/2019 See op note  DG C-Arm 1-60 Min-No Report  Result Date: 06/13/2019 Fluoroscopy was utilized by the requesting physician.  No radiographic interpretation.    PERFORMANCE STATUS (ECOG) : 1 - Symptomatic but completely ambulatory  Review of Systems Unless otherwise noted, a complete review of systems is negative.  Physical Exam General: NAD Pulmonary: Unlabored Extremities: no edema, no joint deformities Skin: no rashes Neurological: Nonfocal  IMPRESSION: Patient was an add-on to the clinic schedule today due to patient's request.  He is accompanied by his wife and daughter.  Patient was hospitalized recently for obstructive jaundice but left AMA prior to the stent exchange.  He underwent stent exchange by Dr. Allen Norris on 6/10.  Patient says that following this he is doing well.  His appetite is improved.  He is having minimal pain.  His biggest complaint is insomnia.  He says that he is not  able to sleep but a couple of hours each night and feels exhausted during the day.  He denies daytime napping.  He is interested in trial of medication to help his insomnia.  Patient says that he has talked with his family and wants to discontinue chemotherapy.  He says he recognizes that his cancer will ultimately be terminal and that he does not wish to prolong his life any further but only wants to focus on comfort and quality of life.  Patient in agreement with hospice  We discussed CODE STATUS.  Patient stated that he would not want his life prolonged artificially machines nor would he want aggressive measures at end-of-life.  He was in agreement with DO NOT RESUSCITATE.  I completed a DNR order for him to take home today.  PLAN: -Best supportive care -Referral to hospice (called in to referral center) -Social work to help with disability paperwork -Continue oxycodone 5 mg every 6 hours as needed for pain -Start trazodone 25 to 50 mg nightly as needed for sleep -DNR/DNI  Case and plan discussed with Dr. Tasia Catchings who will also see patient today  Patient expressed understanding and was in agreement with this plan. He also understands that He can call the clinic at any time with any questions, concerns, or complaints.     Time Total: 30 minutes  Visit consisted of counseling and education dealing with the complex and emotionally intense issues of symptom management and palliative care in the setting of serious and potentially life-threatening illness.Greater than 50%  of this time was spent counseling and coordinating care related to the above assessment and plan.  Signed by: Altha Harm, PhD, NP-C

## 2019-06-21 ENCOUNTER — Inpatient Hospital Stay: Payer: BC Managed Care – PPO

## 2019-06-21 ENCOUNTER — Other Ambulatory Visit: Payer: Self-pay | Admitting: Oncology

## 2019-06-21 ENCOUNTER — Telehealth: Payer: Self-pay | Admitting: Nurse Practitioner

## 2019-06-21 ENCOUNTER — Inpatient Hospital Stay: Payer: BC Managed Care – PPO | Admitting: Oncology

## 2019-06-21 ENCOUNTER — Telehealth: Payer: Self-pay | Admitting: *Deleted

## 2019-06-21 MED ORDER — LORAZEPAM 0.5 MG PO TABS
0.5000 mg | ORAL_TABLET | Freq: Every evening | ORAL | 0 refills | Status: AC | PRN
Start: 2019-06-21 — End: ?

## 2019-06-21 NOTE — Telephone Encounter (Signed)
Patient would like to proceed with hospice care.  Recommend patient to stop tramadol.  I will order Ambien for him.  Thank you

## 2019-06-21 NOTE — Telephone Encounter (Signed)
Spoke with patient and offered to schedule a visit for a Palliative Consult, patient stated that a Hospice nurse was scheduled to come out to see him tomorrow morning.  I asked if it was with [facility] and all he knew was that he thought it was through [facility].  I told him that I would check on this to find out and would call him back and he was in agreement with this.

## 2019-06-21 NOTE — Telephone Encounter (Signed)
Patient called reporting that he has not been able to sleep more than an hour for the past month and that the Trazodone ordered is actually stimulating him. He is requesting something else to help him sleep. He uses [employer]/film[employer]video. Please advise

## 2019-06-21 NOTE — Telephone Encounter (Signed)
Is Dr. Tasia Catchings taking care of this?  Faythe Casa, NP 06/21/2019 11:28 AM

## 2019-06-21 NOTE — Telephone Encounter (Signed)
Patient informed of prescription sent to pharmacy and to be sure that he does not drink with this medicine. He reports that he has not had anything to drink in 7 months and has no plasn to start back.

## 2019-06-21 NOTE — Telephone Encounter (Signed)
Kenneth Maxwell, I changed Ambien (due to his liver function )to Ativan 0.5 mg nightly as needed insomnia.  Please advise patient not to avoid alcohol when he uses Ativan.

## 2019-06-24 ENCOUNTER — Telehealth: Payer: Self-pay | Admitting: Nurse Practitioner

## 2019-06-24 ENCOUNTER — Inpatient Hospital Stay: Payer: BC Managed Care – PPO

## 2019-06-24 NOTE — Telephone Encounter (Signed)
Called patient back to schedule the Palliative Consult, due to he has declined Hospice services at this time.  I have scheduled an In-person Consult for 07/18/19 @ 9 AM

## 2019-06-29 ENCOUNTER — Other Ambulatory Visit: Payer: Self-pay | Admitting: Family Medicine

## 2019-06-29 DIAGNOSIS — I10 Essential (primary) hypertension: Secondary | ICD-10-CM

## 2019-07-02 ENCOUNTER — Encounter: Payer: Self-pay | Admitting: Gastroenterology

## 2019-07-11 ENCOUNTER — Other Ambulatory Visit: Payer: Self-pay | Admitting: *Deleted

## 2019-07-11 MED ORDER — OXYCODONE HCL 5 MG PO TABS: 5.0000 mg | ORAL_TABLET | Freq: Four times a day (QID) | ORAL | 0 refills | Status: DC | PRN

## 2019-07-15 ENCOUNTER — Other Ambulatory Visit: Payer: Self-pay | Admitting: Family Medicine

## 2019-07-15 DIAGNOSIS — I10 Essential (primary) hypertension: Secondary | ICD-10-CM

## 2019-07-18 ENCOUNTER — Other Ambulatory Visit: Payer: Self-pay

## 2019-07-18 ENCOUNTER — Encounter: Payer: Self-pay | Admitting: Nurse Practitioner

## 2019-07-18 ENCOUNTER — Other Ambulatory Visit: Payer: BC Managed Care – PPO | Admitting: Nurse Practitioner

## 2019-07-18 ENCOUNTER — Other Ambulatory Visit: Payer: Self-pay | Admitting: Oncology

## 2019-07-18 DIAGNOSIS — C250 Malignant neoplasm of head of pancreas: Secondary | ICD-10-CM

## 2019-07-18 DIAGNOSIS — Z515 Encounter for palliative care: Secondary | ICD-10-CM

## 2019-07-18 MED ORDER — TRAZODONE HCL 50 MG PO TABS
50.0000 mg | ORAL_TABLET | Freq: Every day | ORAL | 2 refills | Status: AC
Start: 2019-07-18 — End: ?

## 2019-07-18 NOTE — Progress Notes (Signed)
[facility] Consult Note Telephone: [phone]  Fax: [phone]  PATIENT NAME: Kenneth Maxwell DOB: July 17, 1955 MRN: [id number]  PRIMARY CARE PROVIDER:   Olin Hauser, DO  REFERRING PROVIDER:  Olin Hauser, DO [address] [place],  [place] [address]   I was asked by Dr Tasia Catchings to see Kenneth Maxwell for Palliative care consult for goals of care  RESPONSIBLE PARTY:   Self  RECOMMENDATIONS and PLAN:  1. ACP:  DNR; wishes are for Hospice services at home  2. Palliative care encounter; Palliative medicine team will continue to support patient, patient's family, and medical team. Visit consisted of counseling and education dealing with the complex and emotionally intense issues of symptom management and palliative care in the setting of serious and potentially life-threatening illness       I spent 90 minutes providing this consultation,  from 9:00am to 10:30am. More than 50% of the time in this consultation was spent coordinating communication.   HISTORY OF PRESENT ILLNESS:  Kenneth Maxwell is a 64 y.o. 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 Hospitalized 4 / 23 / 2021 to 4 / 26 / 2021 for weakness, dark urine, jaundice with work up significant for a biliary stricture, possible pancreatic adenocarcinoma with liver metastases, alcohol abuse. Hospitalized 6 / 05/2019 to 6 / 8 / 2021 elevated bilirubin found when he went for his chemotherapy treatment. CT scan of abdomen and pelvis show new biliary dilatation despite stent at the tip of the ampulla with increase size of hepatic metastasis started on antibiotics and GI consulted who recommended ercp. He left against medical advice. He is a DNR. Hospice referral was initiated though when called the set up  the appointment Kenneth Maxwell declined. Kenneth Maxwell was agreeable to palliative care consult. Initial palliative care consult face-to-face with Kenneth Maxwell, his wife and daughter. We talked about purposes palliative care visit. Kenneth Maxwell in agreement. We talked about past medical history as he had been fairly healthy up until recent diagnosis of cancer. We talked about recent hospitalizations. Kenneth Maxwell have talked about the frustration he had with the last hospitalization which resulted in him signing out against medical advice. Kenneth Maxwell endorses he did continue to follow the treatment plan for ercp out patient and saw Dr Tasia Catchings Oncology for appointments. We talked about diagnosis of pancreatic cancer with metastasis to liver. Kenneth Maxwell endorses he is drank all of his life 18 to 25 beers a day and smoked. Kenneth. Maxwell endorses he has not had any alcohol to drink in the last 4 months. We talked about Life review. Kenneth Maxwell has retired from [employer] which is his work he has done all of his life. He talked about being married for over 40 years and they do have three children. Kenneth. Maxwell endorses they met when his wife was 35 and he was 54. We talked about how he was feeling. Kenneth Maxwell endorsed is that today he's doing really good. 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 We talked about other days that are more difficult where he is sleeping more with pain. Kenneth. Maxwell does take oxycodone for pain is needed. Kenneth Maxwell endorses the pain has to be severe before he takes that. We talked about his appetite which  is good. 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 Kenneth. Maxwell endorses his biggest concern is not sleeping. Kenneth. Maxwell has tried Ambien and Ativan with both kept him wide Awake. We talked about the melatonin, Kenneth. Maxwell endorses it does not seem to work for him. We talked about the option of possibly trazodone. Kenneth. Maxwell endorses he is willing to try, will follow up with Dr. Tasia Catchings, Oncologist about  trazodone. We talked about medical goals of care. Kenneth Maxwell talked at length about wishes for [place]. Kenneth. Maxwell endorses he does not want quantity of life but quality. Kenneth. Maxwell talked at length about his experience with chemotherapy, decided not to pursue. We talked about role of palliative care and plan of care. We talked about Hospice under Medicare benefit and what that would look like. We talked about services that would be provided. We talked about Hospice philosophy at length. We talked about terminal diagnosis. Kenneth Maxwell endorses he does realize that today he feels good and he made for a couple of weeks and within a month or two he could be bed-bound, symptomatic. We talked about what it's like to have a terminal diagnosis. Kenneth. Maxwell endorses he knows he can not fix it or change it he just wants to make the best of every day. We talked about what Hospice has to offer and that it may be at the beginning he may not need as many resources as when he becomes critically ill. Kenneth and Mrs Bergen talked about it and endorses they would like for Hospice Services. I contacted doctor you. Dr. Tasia Catchings gave verbal order for Hospice Services in addition to sending in trazodone to try. I notified Hospice Physicians for eligibility and deemed eligible. I notified referral intake at hospice for hospice order from Dr Tasia Catchings. I updated Kenneth and Mrs Lassen. Therapeutic listening and emotional support provided. We talked about sadness and grieving process. We talked about expectations with progression including pain. We talked about other pain management modalities that are not medication-related such as meditation. We talked about hospice starting palliative will not schedule a follow-up visit. Questions answer to satisfaction. Contact information provided. Palliative Care was asked to help address goals of care.   CODE STATUS: DNR  PPS: 60% HOSPICE ELIGIBILITY/DIAGNOSIS: TBD  PAST MEDICAL HISTORY:  Past Medical History:    Diagnosis Date  . Alcohol abuse   . Arthritis   . Blood clotting disorder ([facility])   . Chronic renal disease   . Encounter for antineoplastic chemotherapy 05/24/2019  . H/O urinary retention   . Hematuria, gross   . Hypertension   . Pancreatic cancer metastasized to liver ([facility]) 04/2019  . Renal cell carcinoma ([facility]) 11/2013   kidney cancer s/p R nephrectomy    SOCIAL HX:  Social History   Tobacco Use  . Smoking status: Current Every Day Smoker    Packs/day: 1.00    Years: 50.00    Pack years: 50.00    Types: Cigarettes  . Smokeless tobacco: Never Used  . Tobacco comment: down to >0.5ppd  Substance Use Topics  . Alcohol use: Not Currently    Alcohol/week: 49.0 standard drinks    Types: 49 Cans of beer per week    Comment: h/o heavy use, quit about 3 months ago (before diagnosis)    ALLERGIES:  Allergies  Allergen Reactions  . Codeine Rash and Other (See Comments)     PERTINENT MEDICATIONS:  Outpatient Encounter Medications as  of 07/18/2019  Medication Sig  . amLODipine (NORVASC) 10 MG tablet Take 1 tablet (10 mg total) by mouth daily. (Patient not taking: Reported on 06/19/2019)  . dexamethasone (DECADRON) 4 MG tablet Take 2 tablets (8 mg total) by mouth daily. Start the day after chemotherapy for 3 days. Take with food. (Patient not taking: Reported on 06/19/2019)  . lidocaine-prilocaine (EMLA) cream Apply to affected area once (Patient not taking: Reported on 06/19/2019)  . loperamide (IMODIUM A-D) 2 MG tablet Take 2 at onset of diarrhea, then 1 every 2hrs until 12hr without a BM. May take 2 tab every 4hrs at bedtime. If diarrhea recurs repeat. (Patient not taking: Reported on 06/19/2019)  . LORazepam (ATIVAN) 0.5 MG tablet Take 1 tablet (0.5 mg total) by mouth at bedtime as needed for sleep.  Marland Kitchen losartan (COZAAR) 100 MG tablet Take 1 tablet (100 mg total) by mouth daily. (Patient not taking: Reported on 06/19/2019)  . Multiple Vitamin (MULTIVITAMIN WITH MINERALS) TABS tablet Take  1 tablet by mouth daily.  Marland Kitchen omeprazole (PRILOSEC) 20 MG capsule Take 1 capsule (20 mg total) by mouth 2 (two) times daily before a meal.  . oxyCODONE (ROXICODONE) 5 MG immediate release tablet Take 1 tablet (5 mg total) by mouth every 6 (six) hours as needed for severe pain or breakthrough pain.  . Polyethylene Glycol 3350 (MIRALAX PO) Take by mouth. BID (Patient not taking: Reported on 06/19/2019)  . prochlorperazine (COMPAZINE) 10 MG tablet Take 1 tablet (10 mg total) by mouth every 6 (six) hours as needed (Nausea or vomiting).  Marland Kitchen senna (SENOKOT) 8.6 MG TABS tablet Take 2 tablets (17.2 mg total) by mouth daily. (Patient not taking: Reported on 06/19/2019)  . spironolactone (ALDACTONE) 25 MG tablet Take 1 tablet (25 mg total) by mouth daily. (Patient not taking: Reported on 06/19/2019)  . traZODone (DESYREL) 50 MG tablet Take 1 tablet (50 mg total) by mouth at bedtime.   No facility-administered encounter medications on file as of 07/18/2019.    PHYSICAL EXAM:   General: NAD, pleasant male Cardiovascular: regular rate and rhythm Pulmonary: clear ant fields Neurological: ambulatory  Susie Pousson Ihor Gully, NP

## 2019-07-24 ENCOUNTER — Other Ambulatory Visit: Payer: Self-pay | Admitting: Hospice and Palliative Medicine

## 2019-07-24 MED ORDER — OXYCODONE HCL 5 MG PO TABS: 5.0000 mg | ORAL_TABLET | ORAL | 0 refills | Status: DC | PRN

## 2019-07-24 NOTE — Progress Notes (Signed)
I spoke with patient's hospice nurse, April, who requested a refill of oxycodone.

## 2019-07-29 ENCOUNTER — Telehealth: Payer: Self-pay | Admitting: Hospice and Palliative Medicine

## 2019-07-29 NOTE — Telephone Encounter (Signed)
Spoke with patient's hospice nurse.  Patient is taking oxycodone 5 mg about every 4 hours and still has severe pain at times.  Discussed that he could liberalize dosing to 5 to 10 mg every 4 hours as needed.  Would consider starting a longer acting pain medication if necessary.

## 2019-07-30 ENCOUNTER — Encounter: Payer: Self-pay | Admitting: Family Medicine

## 2019-07-31 ENCOUNTER — Other Ambulatory Visit: Payer: Self-pay | Admitting: Oncology

## 2019-07-31 DIAGNOSIS — C259 Malignant neoplasm of pancreas, unspecified: Secondary | ICD-10-CM

## 2019-08-07 ENCOUNTER — Other Ambulatory Visit: Payer: Self-pay | Admitting: Hospice and Palliative Medicine

## 2019-08-07 MED ORDER — OXYCODONE HCL 10 MG PO TABS: 5.0000 mg | ORAL_TABLET | ORAL | 0 refills | Status: DC | PRN

## 2019-08-07 NOTE — Progress Notes (Signed)
I spoke with patient hospice nurse. Patient is regularly requiring 2 tablets of 5mg  oxycodone. Will increase to 10mg  tablets and refill. #90.

## 2019-08-14 ENCOUNTER — Telehealth: Payer: Self-pay | Admitting: *Deleted

## 2019-08-14 NOTE — Telephone Encounter (Signed)
Call from hospice asking if OK for patient to take MS since he has an allergy to Codeine. Please advise

## 2019-08-14 NOTE — Telephone Encounter (Signed)
Cc palliative care.

## 2019-08-15 NOTE — Telephone Encounter (Signed)
I spoke with hospice nurse and orders given. Okay to trial morphine but monitor closely with first dose.

## 2019-08-28 ENCOUNTER — Other Ambulatory Visit: Payer: Self-pay | Admitting: Hospice and Palliative Medicine

## 2019-08-28 MED ORDER — OXYCODONE HCL 10 MG PO TABS: 5.0000 mg | ORAL_TABLET | ORAL | 0 refills | Status: DC | PRN

## 2019-08-28 NOTE — Progress Notes (Signed)
I spoke with hospice nurse. Refill sent for oxycodone.

## 2019-09-03 ENCOUNTER — Other Ambulatory Visit: Payer: Self-pay | Admitting: Hospice and Palliative Medicine

## 2019-09-03 MED ORDER — OXYCODONE HCL 10 MG PO TABS: 10.0000 mg | ORAL_TABLET | ORAL | 0 refills | Status: DC | PRN

## 2019-09-03 MED ORDER — MORPHINE SULFATE ER 15 MG PO TBCR: 15.0000 mg | EXTENDED_RELEASE_TABLET | Freq: Three times a day (TID) | ORAL | 0 refills | Status: DC

## 2019-09-03 NOTE — Progress Notes (Signed)
I spoke with patient's hospice nurse. Patient is having worsened generalized pain with a need to take oxycodone 10mg  about every 3 hours around the clock. Will start him on MS Contin 15mg  Q8H and increase oxycodone to 10-20 mg Q3H PRN for BTP.

## 2019-09-16 ENCOUNTER — Telehealth: Payer: Self-pay | Admitting: Hospice and Palliative Medicine

## 2019-09-16 ENCOUNTER — Other Ambulatory Visit: Payer: Self-pay | Admitting: *Deleted

## 2019-09-16 ENCOUNTER — Telehealth: Payer: Self-pay | Admitting: *Deleted

## 2019-09-16 ENCOUNTER — Other Ambulatory Visit: Payer: Self-pay | Admitting: Hospice and Palliative Medicine

## 2019-09-16 DIAGNOSIS — M25561 Pain in right knee: Secondary | ICD-10-CM

## 2019-09-16 DIAGNOSIS — M79604 Pain in right leg: Secondary | ICD-10-CM

## 2019-09-16 DIAGNOSIS — R2241 Localized swelling, mass and lump, right lower limb: Secondary | ICD-10-CM

## 2019-09-16 NOTE — Telephone Encounter (Signed)
09/16/2019  Per [facility], pt was scheduled for an US of the leg due to new onset swelling. Called and spoke with pt and confirmed appt at 3:15 on 9/16 @ [facility]

## 2019-09-16 NOTE — Telephone Encounter (Signed)
Per [facility]; Hospice patient would like acute work up for new onset Right lower leg swelling/pain. Please order U/S to assess for DVT. U/S order placed. A scheduling message was sent.

## 2019-09-18 ENCOUNTER — Ambulatory Visit
Admission: RE | Admit: 2019-09-18 | Discharge: 2019-09-18 | Disposition: A | Discharge status: Home / Self Care | Payer: BC Managed Care – PPO | Source: Ambulatory Visit | Attending: Hospice and Palliative Medicine | Admitting: Hospice and Palliative Medicine

## 2019-09-18 ENCOUNTER — Other Ambulatory Visit: Payer: Self-pay

## 2019-09-18 ENCOUNTER — Other Ambulatory Visit: Payer: Self-pay | Admitting: Hospice and Palliative Medicine

## 2019-09-18 ENCOUNTER — Telehealth: Payer: Self-pay

## 2019-09-18 DIAGNOSIS — I82431 Acute embolism and thrombosis of right popliteal vein: Secondary | ICD-10-CM | POA: Diagnosis not present

## 2019-09-18 DIAGNOSIS — M79604 Pain in right leg: Secondary | ICD-10-CM

## 2019-09-18 DIAGNOSIS — I82411 Acute embolism and thrombosis of right femoral vein: Secondary | ICD-10-CM | POA: Diagnosis not present

## 2019-09-18 DIAGNOSIS — I82441 Acute embolism and thrombosis of right tibial vein: Secondary | ICD-10-CM | POA: Diagnosis not present

## 2019-09-18 DIAGNOSIS — I82890 Acute embolism and thrombosis of other specified veins: Secondary | ICD-10-CM | POA: Diagnosis not present

## 2019-09-18 MED ORDER — APIXABAN 5 MG PO TABS: ORAL_TABLET | ORAL | 0 refills | Status: DC

## 2019-09-18 MED ORDER — MORPHINE SULFATE ER 15 MG PO TBCR: 15.0000 mg | EXTENDED_RELEASE_TABLET | Freq: Three times a day (TID) | ORAL | 0 refills | Status: DC

## 2019-09-18 NOTE — Progress Notes (Signed)
Patient with extensive RLE DVT. Discussed with Dr. Tasia Catchings. Patient started on Eliquis 10mg  BID x 7 days and then 5mg  BID thereafter. Message sent to vascular surgeon to see if patient is a candidate for thrombectomy. Discussed with both patient and wife.

## 2019-09-18 NOTE — Telephone Encounter (Signed)
Message received from Pete Glatter, NP requesting the LE [place] scheduled for tomorrow be moved to today.  Confirmed with Tonita Phoenix in the ins auth dept and the [place] does not require PA.  Order was changed to STAT and scheduled for today @ 3:45.  Patient was notified by scheduling with appt details.

## 2019-09-19 ENCOUNTER — Other Ambulatory Visit
Admission: RE | Admit: 2019-09-19 | Discharge: 2019-09-19 | Disposition: A | Discharge status: Home / Self Care | Payer: BC Managed Care – PPO | Source: Ambulatory Visit | Attending: Vascular Surgery | Admitting: Vascular Surgery

## 2019-09-19 ENCOUNTER — Other Ambulatory Visit (INDEPENDENT_AMBULATORY_CARE_PROVIDER_SITE_OTHER): Payer: Self-pay | Admitting: Nurse Practitioner

## 2019-09-19 ENCOUNTER — Telehealth: Payer: Self-pay

## 2019-09-19 ENCOUNTER — Telehealth (INDEPENDENT_AMBULATORY_CARE_PROVIDER_SITE_OTHER): Payer: Self-pay

## 2019-09-19 ENCOUNTER — Ambulatory Visit: Payer: BC Managed Care – PPO

## 2019-09-19 ENCOUNTER — Other Ambulatory Visit: Payer: Self-pay | Admitting: Hospice and Palliative Medicine

## 2019-09-19 DIAGNOSIS — Z01812 Encounter for preprocedural laboratory examination: Secondary | ICD-10-CM | POA: Diagnosis not present

## 2019-09-19 DIAGNOSIS — Z20822 Contact with and (suspected) exposure to covid-19: Secondary | ICD-10-CM | POA: Diagnosis not present

## 2019-09-19 MED ORDER — APIXABAN 5 MG PO TABS
ORAL_TABLET | ORAL | 0 refills | Status: DC
Start: 2019-09-19 — End: 2019-09-19

## 2019-09-19 MED ORDER — APIXABAN 5 MG PO TABS
ORAL_TABLET | ORAL | 0 refills | Status: AC
Start: 2019-09-19 — End: ?

## 2019-09-19 NOTE — Telephone Encounter (Signed)
Spoke with the patient and he is scheduled with Dr. Delana Meyer for a right leg thrombectomy on 09/20/19 with a 6:30 am arrival time to the [facility]. Covid testing is today (09/19/19) between 8-1 pm at the [facility]. Pre-procedure instructions were discussed.

## 2019-09-19 NOTE — Progress Notes (Signed)
Rx sent to mail order pharmacy for 64-month supply of Eliquis.  Patient started on Eliquis that he picked up from local pharmacy.  Spoke with hospice nurse this a.m.

## 2019-09-19 NOTE — Telephone Encounter (Addendum)
Called [facility] to confirm receipt of Eliquis rx and if PA is needed.  Representative reviewed patient's insurance information and Eliquis does not require PA but does need to be process with mail order pharmacy [facility] for [employer].  It shows in our computer system that [employer] sent the rx to [facility] but the represented saw that the rx was sent to [facility].  Will resend the rx to [facility] and call later to see if we can expedite the refill for 90 day supply for patient convenience.    Member ID #[id number]

## 2019-09-20 ENCOUNTER — Encounter
Admission: RE | Disposition: A | Discharge status: Home / Self Care | Payer: Self-pay | Source: Home / Self Care | Attending: Vascular Surgery

## 2019-09-20 ENCOUNTER — Ambulatory Visit
Admission: RE | Admit: 2019-09-20 | Discharge: 2019-09-20 | Disposition: A | Discharge status: Home / Self Care | Payer: BC Managed Care – PPO | Attending: Vascular Surgery | Admitting: Vascular Surgery

## 2019-09-20 ENCOUNTER — Other Ambulatory Visit: Payer: Self-pay

## 2019-09-20 ENCOUNTER — Encounter: Payer: Self-pay | Admitting: Vascular Surgery

## 2019-09-20 DIAGNOSIS — N189 Chronic kidney disease, unspecified: Secondary | ICD-10-CM | POA: Diagnosis not present

## 2019-09-20 DIAGNOSIS — Z85528 Personal history of other malignant neoplasm of kidney: Secondary | ICD-10-CM | POA: Diagnosis not present

## 2019-09-20 DIAGNOSIS — I129 Hypertensive chronic kidney disease with stage 1 through stage 4 chronic kidney disease, or unspecified chronic kidney disease: Secondary | ICD-10-CM | POA: Insufficient documentation

## 2019-09-20 DIAGNOSIS — Z905 Acquired absence of kidney: Secondary | ICD-10-CM | POA: Insufficient documentation

## 2019-09-20 DIAGNOSIS — C259 Malignant neoplasm of pancreas, unspecified: Secondary | ICD-10-CM | POA: Insufficient documentation

## 2019-09-20 DIAGNOSIS — Z7901 Long term (current) use of anticoagulants: Secondary | ICD-10-CM | POA: Diagnosis not present

## 2019-09-20 DIAGNOSIS — I82411 Acute embolism and thrombosis of right femoral vein: Secondary | ICD-10-CM | POA: Insufficient documentation

## 2019-09-20 DIAGNOSIS — F17210 Nicotine dependence, cigarettes, uncomplicated: Secondary | ICD-10-CM | POA: Insufficient documentation

## 2019-09-20 DIAGNOSIS — Z885 Allergy status to narcotic agent status: Secondary | ICD-10-CM | POA: Diagnosis not present

## 2019-09-20 DIAGNOSIS — I82401 Acute embolism and thrombosis of unspecified deep veins of right lower extremity: Secondary | ICD-10-CM | POA: Diagnosis not present

## 2019-09-20 DIAGNOSIS — C787 Secondary malignant neoplasm of liver and intrahepatic bile duct: Secondary | ICD-10-CM | POA: Insufficient documentation

## 2019-09-20 DIAGNOSIS — R58 Hemorrhage, not elsewhere classified: Secondary | ICD-10-CM | POA: Diagnosis not present

## 2019-09-20 DIAGNOSIS — Z9221 Personal history of antineoplastic chemotherapy: Secondary | ICD-10-CM | POA: Insufficient documentation

## 2019-09-20 HISTORY — PX: PERIPHERAL VASCULAR THROMBECTOMY: CATH118306

## 2019-09-20 LAB — GLUCOSE, CAPILLARY: Glucose-Capillary: 109 mg/dL — ABNORMAL HIGH (ref 70–99)

## 2019-09-20 LAB — CREATININE, SERUM
Creatinine, Ser: 1.02 mg/dL (ref 0.61–1.24)
GFR calc Af Amer: >60 mL/min (ref >60)
GFR calc non Af Amer: >60 mL/min (ref >60)

## 2019-09-20 LAB — SARS CORONAVIRUS 2 (TAT 6-24 HRS): SARS Coronavirus 2: NEGATIVE

## 2019-09-20 LAB — BUN: BUN: 9 mg/dL (ref 8–23)

## 2019-09-20 SURGERY — PERIPHERAL VASCULAR THROMBECTOMY
Anesthesia: Moderate Sedation | Site: Leg Lower | Laterality: Right

## 2019-09-20 MED ORDER — HEPARIN SODIUM (PORCINE) 1000 UNIT/ML IJ SOLN
INTRAMUSCULAR | Status: AC
  Filled 2019-09-20: qty 1

## 2019-09-20 MED ORDER — CEFAZOLIN SODIUM-DEXTROSE 1-4 GM/50ML-% IV SOLN
INTRAVENOUS | Status: AC
  Filled 2019-09-20: qty 50

## 2019-09-20 MED ORDER — HEPARIN SOD (PORK) LOCK FLUSH 100 UNIT/ML IV SOLN
INTRAVENOUS | Status: AC
  Administered 2019-09-20: 500 [IU] via INTRAVENOUS
  Filled 2019-09-20: qty 5

## 2019-09-20 MED ORDER — FENTANYL CITRATE (PF) 100 MCG/2ML IJ SOLN
INTRAMUSCULAR | Status: AC
  Filled 2019-09-20: qty 2

## 2019-09-20 MED ORDER — FENTANYL CITRATE (PF) 100 MCG/2ML IJ SOLN
INTRAMUSCULAR | Status: DC
Start: 2019-09-20 — End: 2019-09-20
  Filled 2019-09-20: qty 2

## 2019-09-20 MED ORDER — ALTEPLASE 2 MG IJ SOLR
INTRAMUSCULAR | Status: DC | PRN
  Administered 2019-09-20: 12 mg

## 2019-09-20 MED ORDER — METHYLPREDNISOLONE SODIUM SUCC 125 MG IJ SOLR: 125.0000 mg | Freq: Once | INTRAMUSCULAR | Status: DC | PRN

## 2019-09-20 MED ORDER — MIDAZOLAM HCL 2 MG/ML PO SYRP: 8.0000 mg | ORAL_SOLUTION | Freq: Once | ORAL | Status: DC | PRN

## 2019-09-20 MED ORDER — HEPARIN SODIUM (PORCINE) 1000 UNIT/ML IJ SOLN
INTRAMUSCULAR | Status: DC | PRN
  Administered 2019-09-20: 5000 [IU] via INTRAVENOUS

## 2019-09-20 MED ORDER — FAMOTIDINE 20 MG PO TABS: 40.0000 mg | ORAL_TABLET | Freq: Once | ORAL | Status: DC | PRN

## 2019-09-20 MED ORDER — HEPARIN SOD (PORK) LOCK FLUSH 100 UNIT/ML IV SOLN: 500.0000 [IU] | Freq: Once | INTRAVENOUS | Status: AC

## 2019-09-20 MED ORDER — SODIUM CHLORIDE 0.9 % IV SOLN: INTRAVENOUS | Status: DC

## 2019-09-20 MED ORDER — MIDAZOLAM HCL 5 MG/5ML IJ SOLN
INTRAMUSCULAR | Status: AC
  Filled 2019-09-20: qty 5

## 2019-09-20 MED ORDER — FENTANYL CITRATE (PF) 100 MCG/2ML IJ SOLN: 12.5000 ug | Freq: Once | INTRAMUSCULAR | Status: DC | PRN

## 2019-09-20 MED ORDER — APIXABAN 5 MG PO TABS
10.0000 mg | ORAL_TABLET | ORAL | Status: AC
  Administered 2019-09-20: 10 mg via ORAL
  Filled 2019-09-20: qty 2

## 2019-09-20 MED ORDER — FENTANYL CITRATE (PF) 100 MCG/2ML IJ SOLN
INTRAMUSCULAR | Status: DC | PRN
Start: 2019-09-20 — End: 2019-09-20
  Administered 2019-09-20: 100 ug via INTRAVENOUS
  Administered 2019-09-20 (×2): 50 ug via INTRAVENOUS

## 2019-09-20 MED ORDER — CEFAZOLIN SODIUM-DEXTROSE 2-4 GM/100ML-% IV SOLN
2.0000 g | Freq: Once | INTRAVENOUS | Status: AC
  Administered 2019-09-20: 2 g via INTRAVENOUS

## 2019-09-20 MED ORDER — DIPHENHYDRAMINE HCL 50 MG/ML IJ SOLN: 50.0000 mg | Freq: Once | INTRAMUSCULAR | Status: DC | PRN

## 2019-09-20 MED ORDER — MIDAZOLAM HCL 2 MG/2ML IJ SOLN
INTRAMUSCULAR | Status: DC | PRN
  Administered 2019-09-20: 1 mg via INTRAVENOUS
  Administered 2019-09-20: 2 mg via INTRAVENOUS
  Administered 2019-09-20 (×2): 1 mg via INTRAVENOUS

## 2019-09-20 MED ORDER — ALTEPLASE 2 MG IJ SOLR
INTRAMUSCULAR | Status: AC
  Filled 2019-09-20: qty 12

## 2019-09-20 MED ORDER — ONDANSETRON HCL 4 MG/2ML IJ SOLN: 4.0000 mg | Freq: Four times a day (QID) | INTRAMUSCULAR | Status: DC | PRN

## 2019-09-20 MED ORDER — IODIXANOL 320 MG/ML IV SOLN
INTRAVENOUS | Status: DC | PRN
  Administered 2019-09-20: 25 mL via INTRAVENOUS

## 2019-09-20 SURGICAL SUPPLY — 15 items
BALLN DORADO 8X100X80 (BALLOONS) ×3
BALLOON DORADO 8X100X80 (BALLOONS) ×1 IMPLANT
CANISTER PENUMBRA ENGINE (MISCELLANEOUS) ×3 IMPLANT
CATH BEACON 5 .035 65 KMP TIP (CATHETERS) ×3 IMPLANT
CATH INDIGO 12XTORQ 100 (CATHETERS) ×3 IMPLANT
CATH INDIGO SEP 12 (CATHETERS) ×3 IMPLANT
CATH INFUS 90CMX50CM (CATHETERS) ×2
CATH INFUS UNIFUSE 90X50 5FR (CATHETERS) ×1 IMPLANT
KIT ENCORE 26 ADVANTAGE (KITS) ×3 IMPLANT
NEEDLE ENTRY 21GA 7CM ECHOTIP (NEEDLE) ×3 IMPLANT
PACK ANGIOGRAPHY (CUSTOM PROCEDURE TRAY) ×3 IMPLANT
SET INTRO CAPELLA COAXIAL (SET/KITS/TRAYS/PACK) ×3 IMPLANT
SHEATH PINNACLE 11FRX10 (SHEATH) ×3 IMPLANT
WIRE J 3MM .035X145CM (WIRE) ×3 IMPLANT
WIRE MAGIC TOR.035 180C (WIRE) ×3 IMPLANT

## 2019-09-20 NOTE — Telephone Encounter (Signed)
Confirmed with [facility] that rx for Eliquis was received yesterday but can not be processed until 10/05/19 due to med last filled on 09/18/19.

## 2019-09-20 NOTE — Discharge Instructions (Signed)
Catheter-Directed Thrombolysis Catheter-directed thrombolysis is a procedure to remove a blood clot (thrombus) from inside a blood vessel. A blood clot inside a blood vessel can block blood flow or break away and travel to another area of the body, which can cause severe problems such as heart attack or stroke. You may have this procedure to treat:  A clot blocking the blood flow in your arm or leg (limb ischemia).  A clot in a large vein (deep vein thrombosis).  A blockage in a dialysis graft.  A clot in the blood flow to your lungs (pulmonary embolism). Tell a health care provider about:  Any allergies you have.  All medicines you are taking, including vitamins, herbs, eye drops, creams, and over-the-counter medicines.  Any problems you or family members have had with anesthetic medicines.  Any blood disorders you have.  Any surgeries you have had.  Any medical conditions you have.  Whether you are pregnant or may be pregnant.  Whether you are breastfeeding.  Any recent injuries, such as a car accident or fall. What are the risks? Generally, this is a safe procedure. However, problems may occur, including:  Bleeding at the catheter site or other places in the body. Bleeding in the brain is a rare but serious complication.  Movement of the clot, requiring more treatment.  Allergic reactions to medicines or dyes.  Infection.  Bruising or swelling.  Damage to blood vessels.  Damage to the kidneys from the contrast dye. What happens before the procedure? Staying hydrated Follow instructions from your health care provider about hydration, which may include:  Up to 2 hours before the procedure - you may continue to drink clear liquids, such as water, clear fruit juice, black coffee, and plain tea.  Eating and drinking restrictions Follow instructions from your health care provider about eating and drinking, which may include:  8 hours before the procedure - stop  eating heavy meals or foods, such as meat, fried foods, or fatty foods.  6 hours before the procedure - stop eating light meals or foods, such as toast or cereal.  6 hours before the procedure - stop drinking milk or drinks that contain milk.  2 hours before the procedure - stop drinking clear liquids. Medicines Ask your health care provider about:  Changing or stopping your regular medicines. This is especially important if you are taking diabetes medicines or blood thinners.  Taking medicines such as aspirin and ibuprofen. These medicines can thin your blood. Do not take these medicines unless your health care provider tells you to take them.  Taking over-the-counter medicines, vitamins, herbs, and supplements. Tests You may have:  Blood tests to check how well your blood clots.  Tests to check your kidney function. General instructions  Do not use any products that contain nicotine or tobacco for at least 4 weeks before the procedure. These products include cigarettes, e-cigarettes, and chewing tobacco. If you need help quitting, ask your health care provider.  Plan to have someone take you home from the hospital or clinic.  Ask your health care provider what steps will be taken to help prevent infection. These may include washing your skin with a germ-killing soap. What happens during the procedure?   An IV will be inserted into one of your veins.  You will be given one or more of the following: ? A medicine to help you relax (sedative). ? A medicine to numb the surgical area (local anesthetic). This is given as an injection near  the incision. ? A medicine to make you fall asleep (general anesthetic).  A small incision will be made in your groin area or in your wrist.  A catheter will be placed through the incision and into a blood vessel that leads to the clot.  The catheter will be moved through the blood vessel toward the clot.  A dye, also called contrast dye, will  be injected through the catheter as it is moved forward. A type of X-ray (fluoroscopy) will be done to guide the movement of the catheter. The dye makes the catheter easy to see during imaging.  When the catheter reaches the clot, your health care provider will break up the clot by doing one of the following: ? Injecting a clot-dissolving medicine through the catheter. ? Threading a device through the catheter and using that device to break up the clot.  The catheter may be left in place to deliver clot-dissolving medicine during the next 24-72 hours.  Your incision may be covered with a bandage (dressing). The procedure may vary among health care providers and hospitals. What happens after the procedure?  You may need to stay in bed at the hospital to receive clot-dissolving medicine through your catheter.  Your blood pressure, heart rate, breathing rate, and blood oxygen level will be monitored until you leave the hospital or clinic.  You may continue to receive fluids and medicines through an IV. Your IV will also be used to remove blood for testing.  You will be given pain medicine as needed.  You may need to wear compression stockings or a compression sleeve on your arm or leg. These garments help to prevent blood clots and reduce swelling.  Your catheter and IV will be removed after treatment for the clot is complete. Summary  Catheter-directed thrombolysis is a procedure to remove a blood clot from inside a blood vessel.  Before the procedure, follow your health care provider's instructions about eating and drinking restrictions. You may be asked to stop eating and drinking several hours before the procedure.  During this procedure, a catheter is inserted through your skin into a blood vessel that leads to the clot.  The clot is either dissolved with medicine or broken up with a device. You may need to stay in the hospital for 24-72 hours while medicine continues to dissolve the  clot. This information is not intended to replace advice given to you by your health care provider. Make sure you discuss any questions you have with your health care provider. Document Revised: 07/02/2018 Document Reviewed: 07/02/2018 Elsevier Patient Education  [place]. Moderate Conscious Sedation, Adult, Care After These instructions provide you with information about caring for yourself after your procedure. Your health care provider may also give you more specific instructions. Your treatment has been planned according to current medical practices, but problems sometimes occur. Call your health care provider if you have any problems or questions after your procedure. What can I expect after the procedure? After your procedure, it is common:  To feel sleepy for several hours.  To feel clumsy and have poor balance for several hours.  To have poor judgment for several hours.  To vomit if you eat too soon. Follow these instructions at home: For at least 24 hours after the procedure:   Do not: ? Participate in activities where you could fall or become injured. ? Drive. ? Use heavy machinery. ? Drink alcohol. ? Take sleeping pills or medicines that cause drowsiness. ? Make  important decisions or sign legal documents. ? Take care of children on your own.  Rest. Eating and drinking  Follow the diet recommended by your health care provider.  If you vomit: ? Drink water, juice, or soup when you can drink without vomiting. ? Make sure you have little or no nausea before eating solid foods. General instructions  Have a responsible adult stay with you until you are awake and alert.  Take over-the-counter and prescription medicines only as told by your health care provider.  If you smoke, do not smoke without supervision.  Keep all follow-up visits as told by your health care provider. This is important. Contact a health care provider if:  You keep feeling nauseous  or you keep vomiting.  You feel light-headed.  You develop a rash.  You have a fever. Get help right away if:  You have trouble breathing. This information is not intended to replace advice given to you by your health care provider. Make sure you discuss any questions you have with your health care provider. Document Revised: 12/02/2016 Document Reviewed: 04/11/2015 Elsevier Patient Education  2020 [employer].

## 2019-09-20 NOTE — Op Note (Signed)
Kenneth Maxwell   OPERATIVE NOTE   PRE-OPERATIVE DIAGNOSIS: Symptomatic extensive right lower extremity DVT; metastatic pancreatic carcinoma  POST-OPERATIVE DIAGNOSIS: same   PROCEDURE: 1.   US guidance for vascular access to right popliteal vein 2.   Catheter placement into right common iliac vein from right popliteal approach 3.   IVC gram and right leg venogram 4.   Catheter directed thrombolysis with 12 mg of Tpa to the popliteal and femoral veins 5.   Mechanical thrombectomy to popliteal superficial femoral and common femoral veins 6.   PTA of superficial femoral and common femoral vein with 8 mm x 80 mm Dorado balloon    SURGEON: Kenneth Pilar, MD  ASSISTANT(S): none  ANESTHESIA: Continuous ECG pulse oximetry cardiopulmonary monitoring was performed throughout the entire procedure by the interventional radiology nurse total sedation time was 58 minutes.  ESTIMATED BLOOD LOSS: 20 cc  FINDING(S): 1.   Thrombus within the popliteal superficial femoral and common femoral veins the external iliac and common iliac veins were widely patent as was the IVC  SPECIMEN(S):  none  INDICATIONS:    Patient is a 64 y.o. male who presents with a symptomatic painful right lower extremity which is massively swollen.  Duplex ultrasound demonstrated thrombus within the popliteal superficial femoral common femoral veins.  Patient has marked leg swelling and pain.  Venous intervention is performed to reduce the symtpoms and avoid long term postphlebitic symptoms.   Risks and benefits were reviewed all questions were answered patient has agreed to proceed  DESCRIPTION: After obtaining full informed written consent, the patient was brought back to the vascular suite and placed supine upon the table. Moderate conscious sedation was administered during a face to face encounter with the patient throughout the procedure with my supervision of the RN administering medicines and  monitoring the patient's vital signs, pulse oximetry, telemetry and mental status throughout from the start of the procedure until the patient was taken to the recovery room.  After obtaining adequate anesthesia, the patient was prepped and draped in the standard fashion.    The patient was then placed into the prone position.  The right popliteal vein was then accessed under direct ultrasound guidance without difficulty with a micropuncture needle and a permanent image was recorded.  I then upsized to an 11 Fr sheath over a J wire.   5000 units of heparin were then given.  Imaging showed extensive DVT with minimal flow.  A Kumpe catheter and Magic tourque wire were then advanced into the CFV and images were performed.   Catheter was then advanced into the iliac system and imaging was obtained.  I was able to cross the thrombus and stenosis and advance into the IVC which was patent.  I then used the infusion catheter with a 50 cm infusion length and instilled 12 mg of tpa throughout the popliteal superficial femoral and common femoral veins.  After this dwelled for 15 minutes, I used the Penumbra Cat 12 catheter and evacuated large amounts of clot with minimal effluent.  The mechanical thrombectomy was performed throughout the femoral-popliteal veins.  This had resulted in near total elimination of the thrombus uncovering a greater than 80% stenosis in the proximal superficial femoral vein near the confluence of the common femoral vein.  I then treated the superficial femoral and CFV with 8 mm x 100 mm diameter Dorado angioplasty balloon to open a channel.  This resulted in resolution more than 95% of the thrombus and improved flow.  I then elected to terminate the procedure.  The sheath was removed and a dressing was placed.  She was taken to the recovery room in stable condition having tolerated the procedure well.    COMPLICATIONS: None  CONDITION: Stable  Kenneth Maxwell 09/20/2019 8:52 AM

## 2019-09-20 NOTE — Progress Notes (Signed)
Pt given hot chocolate, water and a sandwich tray per request. Wife to bedside.

## 2019-09-20 NOTE — H&P (Signed)
[facility] Admission History & Physical  MRN : [id number]  Kenneth Maxwell is a 64 y.o. (08/10/1955) male who presents with chief complaint of severe right leg pain associated with blood clots.  History of Present Illness:   Patient is a 64 year old gentleman with known metastatic pancreatic cancer who presented with approximately 24 hours of increasing right leg pain associated with severe swelling.  Over the course of that time the pain increased steadily to the point he was rating it a 10 out of 10 and sought medical attention.  A duplex ultrasound was obtained which demonstrated acute DVT from the popliteal extending into the external iliac.  Patient denies shortness of breath or pleuritic chest pains.  There is no past history of DVT.  He has been started on Eliquis and we are asked to evaluate for further treatment for palliation.  Current Facility-Administered Medications  Medication Dose Route Frequency Provider Last Rate Last Admin  . 0.9 %  sodium chloride infusion   Intravenous Continuous Kris Hartmann, NP 75 mL/hr at 09/20/19 0725 New Bag at 09/20/19 0725  . ceFAZolin (ANCEF) 1-4 GM/50ML-% IVPB           . ceFAZolin (ANCEF) IVPB 2g/100 mL premix  2 g Intravenous Once Eulogio Ditch E, NP      . diphenhydrAMINE (BENADRYL) injection 50 mg  50 mg Intravenous Once PRN Kris Hartmann, NP      . famotidine (PEPCID) tablet 40 mg  40 mg Oral Once PRN Kris Hartmann, NP      . fentaNYL (SUBLIMAZE) 100 MCG/2ML injection           . fentaNYL (SUBLIMAZE) injection 12.5 mcg  12.5 mcg Intravenous Once PRN Eulogio Ditch E, NP      . heparin sodium (porcine) 1000 UNIT/ML injection           . methylPREDNISolone sodium succinate (SOLU-MEDROL) 125 mg/2 mL injection 125 mg  125 mg Intravenous Once PRN Eulogio Ditch E, NP      . midazolam (VERSED) 2 MG/ML syrup 8 mg  8 mg Oral Once PRN Kris Hartmann, NP      . midazolam (VERSED) 5 MG/5ML injection           .  ondansetron (ZOFRAN) injection 4 mg  4 mg Intravenous Q6H PRN Kris Hartmann, NP        Past Medical History:  Diagnosis Date  . Alcohol abuse   . Arthritis   . Blood clotting disorder ([place])   . Chronic renal disease   . Encounter for antineoplastic chemotherapy 05/24/2019  . H/O urinary retention   . Hematuria, gross   . Hypertension   . Pancreatic cancer metastasized to liver ([place]) 04/2019  . Renal cell carcinoma ([place]) 11/2013   kidney cancer s/p R nephrectomy    Past Surgical History:  Procedure Laterality Date  . ERCP N/A 04/28/2019   Procedure: ENDOSCOPIC RETROGRADE CHOLANGIOPANCREATOGRAPHY (ERCP);  Surgeon: Lucilla Lame, MD;  Location: [facility] ENDOSCOPY;  Service: Endoscopy;  Laterality: N/A;  . ERCP N/A 06/13/2019   Procedure: ENDOSCOPIC RETROGRADE CHOLANGIOPANCREATOGRAPHY (ERCP);  Surgeon: Lucilla Lame, MD;  Location: [facility] ENDOSCOPY;  Service: Endoscopy;  Laterality: N/A;  . kidney removed Right   . liver stent Right   . NEPHRECTOMY RADICAL Right 11/2013   R side kidney cancer surgery, non adrenal sparing  . PORTA CATH INSERTION N/A 05/20/2019   Procedure: PORTA CATH INSERTION;  Surgeon: Algernon Huxley, MD;  Location: [facility]  INVASIVE CV LAB;  Service: Cardiovascular;  Laterality: N/A;     Social History   Tobacco Use  . Smoking status: Current Every Day Smoker    Packs/day: 1.00    Years: 50.00    Pack years: 50.00    Types: Cigarettes  . Smokeless tobacco: Never Used  . Tobacco comment: down to >0.5ppd  Vaping Use  . Vaping Use: Never used  Substance Use Topics  . Alcohol use: Not Currently    Alcohol/week: 49.0 standard drinks    Types: 49 Cans of beer per week    Comment: h/o heavy use, quit about 3 months ago (before diagnosis)  . Drug use: Yes    Types: Marijuana     Family History  Problem Relation Age of Onset  . Hypertension Mother   . Diabetes Mother   . Hypertension Father   . Heart attack Father   . Cancer Neg Hx   No family history of  bleeding/clotting disorders, porphyria or autoimmune disease   Allergies  Allergen Reactions  . Codeine Rash and Other (See Comments)     REVIEW OF SYSTEMS (Negative unless checked)  Constitutional: [] Weight loss  [] Fever  [] Chills Cardiac: [] Chest pain   [] Chest pressure   [] Palpitations   [] Shortness of breath when laying flat   [] Shortness of breath at rest   [] Shortness of breath with exertion. Vascular:  [] Pain in legs with walking   [x] Pain in legs at rest   [] Pain in legs when laying flat   [] Claudication   [] Pain in feet when walking  [] Pain in feet at rest  [] Pain in feet when laying flat   [x] History of DVT   [] Phlebitis   [x] Swelling in legs   [] Varicose veins   [] Non-healing ulcers Pulmonary:   [] Uses home oxygen   [] Productive cough   [] Hemoptysis   [] Wheeze  [] COPD   [] Asthma Neurologic:  [] Dizziness  [] Blackouts   [] Seizures   [] History of stroke   [] History of TIA  [] Aphasia   [] Temporary blindness   [] Dysphagia   [] Weakness or numbness in arms   [] Weakness or numbness in legs Musculoskeletal:  [] Arthritis   [] Joint swelling   [] Joint pain   [] Low back pain Hematologic:  [] Easy bruising  [] Easy bleeding   [] Hypercoagulable state   [] Anemic  [] Hepatitis Gastrointestinal:  [] Blood in stool   [] Vomiting blood  [] Gastroesophageal reflux/heartburn   [] Difficulty swallowing. Genitourinary:  [] Chronic kidney disease   [] Difficult urination  [] Frequent urination  [] Burning with urination   [] Blood in urine Skin:  [] Rashes   [] Ulcers   [] Wounds Psychological:  [] History of anxiety   []  History of major depression.  Physical Examination  Vitals:   09/20/19 0645  BP: 128/77  Pulse: 100  Resp: 20  Temp: 98.1 F (36.7 C)  TempSrc: Oral  SpO2: 99%  Weight: 68 kg  Height: 5\' 11"  (1.803 m)   Body mass index is 20.92 kg/m. Gen: WD/WN, NAD Head: [place]/AT, No temporalis wasting. Prominent temp pulse not noted. Ear/Nose/Throat: Hearing grossly intact, nares w/o erythema or drainage,  oropharynx w/o Erythema/Exudate,  Eyes: Conjunctiva clear, sclera non-icteric Neck: Trachea midline.  No JVD.  Pulmonary:  Good air movement, respirations not labored, no use of accessory muscles.  Cardiac: RRR, normal S1, S2. Vascular: Right leg is swollen and tight. Vessel Right Left  Radial Palpable Palpable  Gastrointestinal: soft, non-tender/non-distended. No guarding/reflex.  Musculoskeletal: M/S 5/5 throughout.  Extremities without ischemic changes.  No deformity or atrophy.  Neurologic: Sensation grossly intact  in extremities.  Symmetrical.  Speech is fluent. Motor exam as listed above. Psychiatric: Judgment intact, Mood & affect appropriate for pt's clinical situation. Dermatologic: No rashes or ulcers noted.  No cellulitis or open wounds.      CBC Lab Results  Component Value Date   WBC 28.6 (H) 06/19/2019   HGB 10.4 (L) 06/19/2019   HCT 30.6 (L) 06/19/2019   MCV 90.3 06/19/2019   PLT 736 (H) 06/19/2019    BMET    Component Value Date/Time   NA 131 (L) 06/19/2019 1207   NA 140 11/13/2013 0422   K 3.9 06/19/2019 1207   K 3.6 11/13/2013 0422   CL 97 (L) 06/19/2019 1207   CL 104 11/13/2013 0422   CO2 24 06/19/2019 1207   CO2 30 11/13/2013 0422   GLUCOSE 108 (H) 06/19/2019 1207   GLUCOSE 90 11/13/2013 0422   BUN 11 06/19/2019 1207   BUN 9 11/13/2013 0422   CREATININE 0.95 06/19/2019 1207   CREATININE 1.38 (H) 04/25/2019 0920   CALCIUM 8.6 (L) 06/19/2019 1207   CALCIUM 7.9 (L) 11/13/2013 0422   GFRNONAA >60 06/19/2019 1207   GFRNONAA 55 (L) 04/25/2019 0920   GFRAA >60 06/19/2019 1207   GFRAA 63 04/25/2019 0920   CrCl cannot be calculated (Patient's most recent lab result is older than the maximum 21 days allowed.).  COAG Lab Results  Component Value Date   INR 1.2 06/07/2019   INR 0.9 05/10/2019   INR 0.9 04/26/2019    Radiology US Venous Img Lower Unilateral Right  Result Date: 09/18/2019 CLINICAL DATA:  64 year old male with right lower  extremity pain and swelling EXAM: RIGHT LOWER EXTREMITY VENOUS DOPPLER ULTRASOUND TECHNIQUE: Gray-scale sonography with graded compression, as well as color Doppler and duplex ultrasound were performed to evaluate the lower extremity deep venous systems from the level of the common femoral vein and including the common femoral, femoral, profunda femoral, popliteal and calf veins including the posterior tibial, peroneal and gastrocnemius veins when visible. The superficial great saphenous vein was also interrogated. Spectral Doppler was utilized to evaluate flow at rest and with distal augmentation maneuvers in the common femoral, femoral and popliteal veins. COMPARISON:  None. FINDINGS: Contralateral Common Femoral Vein: Respiratory phasicity is normal and symmetric with the symptomatic side. No evidence of thrombus. Normal compressibility. Common Femoral Vein: Occlusive thrombus of the common femoral vein extending above the limits of the ultrasound study into the pelvis. Saphenofemoral Junction: No evidence of thrombus. Normal compressibility and flow on color Doppler imaging. Profunda Femoral Vein: Occlusive thrombus of the profunda vein which is noncompressible. Femoral Vein: Occlusive thrombus through the femoral vein into the popliteal vein. Popliteal Vein: Occlusive thrombus of the popliteal vein into the tibial veins. Calf Veins: Occlusive thrombus of the posterior tibial vein and peroneal veins Superficial Great Saphenous Vein: No evidence of thrombus. Normal compressibility and flow on color Doppler imaging. Other Findings:  None. IMPRESSION: Study positive for occlusive DVT of right common femoral vein, profunda vein, femoral vein, popliteal vein, extending into the tibial veins. Thrombus extends above the limits of the ultrasound study into the pelvis. If there is concern to evaluate IVC or the iliac veins, contrast-enhanced CT may be considered. Electronically Signed   By: Corrie Mckusick D.O.   On:  09/18/2019 16:12     Assessment/Plan 1.  DVT right lower extremity very symptomatic and extensive: I have discussed thrombectomy with the patient this should serve to debulk his symptomatic clot provide him tremendous pain  relief and improve his overall condition.  He concedes that this is for palliation given his malignancy.    2.  Metastatic pancreatic CA: Patient will continue with oncology as well as hospice.  Planned procedure will be as an outpatient and will continue follow-up with them as ordered  Hortencia Pilar, MD  09/20/2019 7:37 AM

## 2019-09-23 ENCOUNTER — Other Ambulatory Visit: Payer: Self-pay | Admitting: Hospice and Palliative Medicine

## 2019-09-23 MED ORDER — OXYCODONE HCL 10 MG PO TABS: 10.0000 mg | ORAL_TABLET | ORAL | 0 refills | Status: AC | PRN

## 2019-09-23 MED ORDER — MORPHINE SULFATE ER 30 MG PO TBCR: 30.0000 mg | EXTENDED_RELEASE_TABLET | Freq: Three times a day (TID) | ORAL | 0 refills | Status: AC

## 2019-09-23 NOTE — Progress Notes (Signed)
I spoke with patient's hospice nurse. Patient is requiring frequent use of oxycodone for BTP. Will increase MS Contin to 30mg  Q8H. Refill of oxycodone was also requested.

## 2019-10-01 ENCOUNTER — Inpatient Hospital Stay
Admission: EM | Admit: 2019-10-01 | Discharge: 2019-10-04 | DRG: 064 | Disposition: E | Payer: BC Managed Care – PPO | Attending: Internal Medicine | Admitting: Internal Medicine

## 2019-10-01 ENCOUNTER — Inpatient Hospital Stay: Payer: BC Managed Care – PPO

## 2019-10-01 ENCOUNTER — Emergency Department: Payer: BC Managed Care – PPO

## 2019-10-01 ENCOUNTER — Other Ambulatory Visit: Payer: Self-pay

## 2019-10-01 ENCOUNTER — Encounter: Payer: Self-pay | Admitting: Internal Medicine

## 2019-10-01 DIAGNOSIS — Z515 Encounter for palliative care: Secondary | ICD-10-CM

## 2019-10-01 DIAGNOSIS — Z720 Tobacco use: Secondary | ICD-10-CM | POA: Diagnosis present

## 2019-10-01 DIAGNOSIS — I2699 Other pulmonary embolism without acute cor pulmonale: Secondary | ICD-10-CM

## 2019-10-01 DIAGNOSIS — I82409 Acute embolism and thrombosis of unspecified deep veins of unspecified lower extremity: Secondary | ICD-10-CM | POA: Diagnosis present

## 2019-10-01 DIAGNOSIS — Z85528 Personal history of other malignant neoplasm of kidney: Secondary | ICD-10-CM | POA: Diagnosis not present

## 2019-10-01 DIAGNOSIS — E860 Dehydration: Secondary | ICD-10-CM | POA: Diagnosis present

## 2019-10-01 DIAGNOSIS — D6959 Other secondary thrombocytopenia: Secondary | ICD-10-CM | POA: Diagnosis present

## 2019-10-01 DIAGNOSIS — I700 Atherosclerosis of aorta: Secondary | ICD-10-CM | POA: Diagnosis not present

## 2019-10-01 DIAGNOSIS — D62 Acute posthemorrhagic anemia: Secondary | ICD-10-CM | POA: Diagnosis present

## 2019-10-01 DIAGNOSIS — K920 Hematemesis: Secondary | ICD-10-CM | POA: Diagnosis present

## 2019-10-01 DIAGNOSIS — D72829 Elevated white blood cell count, unspecified: Secondary | ICD-10-CM | POA: Diagnosis present

## 2019-10-01 DIAGNOSIS — J439 Emphysema, unspecified: Secondary | ICD-10-CM | POA: Diagnosis not present

## 2019-10-01 DIAGNOSIS — C787 Secondary malignant neoplasm of liver and intrahepatic bile duct: Secondary | ICD-10-CM | POA: Diagnosis present

## 2019-10-01 DIAGNOSIS — Z905 Acquired absence of kidney: Secondary | ICD-10-CM

## 2019-10-01 DIAGNOSIS — I10 Essential (primary) hypertension: Secondary | ICD-10-CM | POA: Diagnosis present

## 2019-10-01 DIAGNOSIS — I639 Cerebral infarction, unspecified: Principal | ICD-10-CM

## 2019-10-01 DIAGNOSIS — F1010 Alcohol abuse, uncomplicated: Secondary | ICD-10-CM | POA: Diagnosis present

## 2019-10-01 DIAGNOSIS — Z86718 Personal history of other venous thrombosis and embolism: Secondary | ICD-10-CM

## 2019-10-01 DIAGNOSIS — Z66 Do not resuscitate: Secondary | ICD-10-CM | POA: Diagnosis present

## 2019-10-01 DIAGNOSIS — C259 Malignant neoplasm of pancreas, unspecified: Secondary | ICD-10-CM | POA: Diagnosis present

## 2019-10-01 DIAGNOSIS — F17210 Nicotine dependence, cigarettes, uncomplicated: Secondary | ICD-10-CM | POA: Diagnosis present

## 2019-10-01 DIAGNOSIS — G9341 Metabolic encephalopathy: Secondary | ICD-10-CM | POA: Diagnosis not present

## 2019-10-01 DIAGNOSIS — Z20822 Contact with and (suspected) exposure to covid-19: Secondary | ICD-10-CM | POA: Diagnosis present

## 2019-10-01 DIAGNOSIS — Z7901 Long term (current) use of anticoagulants: Secondary | ICD-10-CM

## 2019-10-01 DIAGNOSIS — I2510 Atherosclerotic heart disease of native coronary artery without angina pectoris: Secondary | ICD-10-CM | POA: Diagnosis not present

## 2019-10-01 DIAGNOSIS — F1011 Alcohol abuse, in remission: Secondary | ICD-10-CM | POA: Diagnosis present

## 2019-10-01 DIAGNOSIS — I959 Hypotension, unspecified: Secondary | ICD-10-CM | POA: Diagnosis present

## 2019-10-01 DIAGNOSIS — N179 Acute kidney failure, unspecified: Secondary | ICD-10-CM | POA: Diagnosis present

## 2019-10-01 DIAGNOSIS — Z9221 Personal history of antineoplastic chemotherapy: Secondary | ICD-10-CM

## 2019-10-01 DIAGNOSIS — F419 Anxiety disorder, unspecified: Secondary | ICD-10-CM | POA: Diagnosis present

## 2019-10-01 DIAGNOSIS — Z79899 Other long term (current) drug therapy: Secondary | ICD-10-CM | POA: Diagnosis not present

## 2019-10-01 DIAGNOSIS — E876 Hypokalemia: Secondary | ICD-10-CM | POA: Diagnosis present

## 2019-10-01 DIAGNOSIS — R930 Abnormal findings on diagnostic imaging of skull and head, not elsewhere classified: Secondary | ICD-10-CM | POA: Diagnosis not present

## 2019-10-01 DIAGNOSIS — Z79891 Long term (current) use of opiate analgesic: Secondary | ICD-10-CM | POA: Diagnosis not present

## 2019-10-01 DIAGNOSIS — R079 Chest pain, unspecified: Secondary | ICD-10-CM | POA: Diagnosis present

## 2019-10-01 DIAGNOSIS — I248 Other forms of acute ischemic heart disease: Secondary | ICD-10-CM | POA: Diagnosis present

## 2019-10-01 DIAGNOSIS — I6782 Cerebral ischemia: Secondary | ICD-10-CM | POA: Diagnosis not present

## 2019-10-01 DIAGNOSIS — I63233 Cerebral infarction due to unspecified occlusion or stenosis of bilateral carotid arteries: Secondary | ICD-10-CM | POA: Diagnosis not present

## 2019-10-01 LAB — COMPREHENSIVE METABOLIC PANEL
ALT: 20 U/L (ref 0–44)
AST: 36 U/L (ref 15–41)
Albumin: 2.6 g/dL — ABNORMAL LOW (ref 3.5–5.0)
Alkaline Phosphatase: 199 U/L — ABNORMAL HIGH (ref 38–126)
Anion gap: 19 — ABNORMAL HIGH (ref 5–15)
BUN: 15 mg/dL (ref 8–23)
CO2: 16 mmol/L — ABNORMAL LOW (ref 22–32)
Calcium: 8.6 mg/dL — ABNORMAL LOW (ref 8.9–10.3)
Chloride: 98 mmol/L (ref 98–111)
Creatinine, Ser: 1.28 mg/dL — ABNORMAL HIGH (ref 0.61–1.24)
GFR calc Af Amer: >60 mL/min (ref >60)
GFR calc non Af Amer: >60 mL/min (ref >60)
Glucose, Bld: 215 mg/dL — ABNORMAL HIGH (ref 70–99)
Potassium: 3.3 mmol/L — ABNORMAL LOW (ref 3.5–5.1)
Sodium: 133 mmol/L — ABNORMAL LOW (ref 135–145)
Total Bilirubin: 0.8 mg/dL (ref 0.3–1.2)
Total Protein: 6.2 g/dL — ABNORMAL LOW (ref 6.5–8.1)

## 2019-10-01 LAB — CBC
HCT: 25.2 % — ABNORMAL LOW (ref 39.0–52.0)
Hemoglobin: 8.4 g/dL — ABNORMAL LOW (ref 13.0–17.0)
MCH: 27.4 pg (ref 26.0–34.0)
MCHC: 33.3 g/dL (ref 30.0–36.0)
MCV: 82.1 fL (ref 80.0–100.0)
Platelets: 140 10*3/uL — ABNORMAL LOW (ref 150–400)
RBC: 3.07 MIL/uL — ABNORMAL LOW (ref 4.22–5.81)
RDW: 15.9 % — ABNORMAL HIGH (ref 11.5–15.5)
WBC: 23.6 10*3/uL — ABNORMAL HIGH (ref 4.0–10.5)
nRBC: 0 % (ref 0.0–0.2)

## 2019-10-01 LAB — CBC WITH DIFFERENTIAL/PLATELET
Abs Immature Granulocytes: 0.63 10*3/uL — ABNORMAL HIGH (ref 0.00–0.07)
Basophils Absolute: 0.1 10*3/uL (ref 0.0–0.1)
Basophils Relative: 0 %
Eosinophils Absolute: 0.2 10*3/uL (ref 0.0–0.5)
Eosinophils Relative: 1 %
HCT: 28.5 % — ABNORMAL LOW (ref 39.0–52.0)
Hemoglobin: 9.5 g/dL — ABNORMAL LOW (ref 13.0–17.0)
Immature Granulocytes: 3 %
Lymphocytes Relative: 12 %
Lymphs Abs: 2.3 10*3/uL (ref 0.7–4.0)
MCH: 27.6 pg (ref 26.0–34.0)
MCHC: 33.3 g/dL (ref 30.0–36.0)
MCV: 82.8 fL (ref 80.0–100.0)
Monocytes Absolute: 1.5 10*3/uL — ABNORMAL HIGH (ref 0.1–1.0)
Monocytes Relative: 8 %
Neutro Abs: 15.3 10*3/uL — ABNORMAL HIGH (ref 1.7–7.7)
Neutrophils Relative %: 76 %
Platelets: 187 10*3/uL (ref 150–400)
RBC: 3.44 MIL/uL — ABNORMAL LOW (ref 4.22–5.81)
RDW: 16.1 % — ABNORMAL HIGH (ref 11.5–15.5)
WBC: 20.1 10*3/uL — ABNORMAL HIGH (ref 4.0–10.5)
nRBC: 0 % (ref 0.0–0.2)

## 2019-10-01 LAB — TROPONIN I (HIGH SENSITIVITY)
Troponin I (High Sensitivity): 412 ng/L (ref <18)
Troponin I (High Sensitivity): 454 ng/L (ref <18)

## 2019-10-01 LAB — LIPASE, BLOOD: Lipase: 16 U/L (ref 11–51)

## 2019-10-01 LAB — APTT: aPTT: 32 seconds (ref 24–36)

## 2019-10-01 LAB — AMMONIA: Ammonia: 51 umol/L — ABNORMAL HIGH (ref 9–35)

## 2019-10-01 LAB — PROTIME-INR
INR: 1.7 — ABNORMAL HIGH (ref 0.8–1.2)
Prothrombin Time: 19.1 seconds — ABNORMAL HIGH (ref 11.4–15.2)

## 2019-10-01 LAB — RESPIRATORY PANEL BY RT PCR (FLU A&B, COVID)
Influenza A by PCR: NEGATIVE
Influenza B by PCR: NEGATIVE
SARS Coronavirus 2 by RT PCR: NEGATIVE

## 2019-10-01 MED ORDER — NICOTINE 21 MG/24HR TD PT24
21.0000 mg | MEDICATED_PATCH | Freq: Every day | TRANSDERMAL | Status: DC
  Administered 2019-10-01: 21 mg via TRANSDERMAL
  Filled 2019-10-01: qty 1

## 2019-10-01 MED ORDER — SODIUM CHLORIDE 0.9 % IV SOLN: INTRAVENOUS | Status: DC

## 2019-10-01 MED ORDER — ONDANSETRON HCL 4 MG/2ML IJ SOLN
4.0000 mg | Freq: Three times a day (TID) | INTRAMUSCULAR | Status: DC | PRN
  Administered 2019-10-01: 4 mg via INTRAVENOUS
  Filled 2019-10-01: qty 2

## 2019-10-01 MED ORDER — STROKE: EARLY STAGES OF RECOVERY BOOK: Freq: Once | Status: DC

## 2019-10-01 MED ORDER — HALOPERIDOL LACTATE 5 MG/ML IJ SOLN
5.0000 mg | Freq: Once | INTRAMUSCULAR | Status: AC
  Administered 2019-10-01: 5 mg via INTRAVENOUS
  Filled 2019-10-01: qty 1

## 2019-10-01 MED ORDER — HYDRALAZINE HCL 20 MG/ML IJ SOLN: 5.0000 mg | INTRAMUSCULAR | Status: DC | PRN

## 2019-10-01 MED ORDER — IOHEXOL 350 MG/ML SOLN
75.0000 mL | Freq: Once | INTRAVENOUS | Status: AC | PRN
  Administered 2019-10-01: 75 mL via INTRAVENOUS

## 2019-10-01 MED ORDER — ADULT MULTIVITAMIN W/MINERALS CH: 1.0000 | ORAL_TABLET | Freq: Every day | ORAL | Status: DC

## 2019-10-01 MED ORDER — MORPHINE SULFATE (PF) 2 MG/ML IV SOLN
2.0000 mg | INTRAVENOUS | Status: DC | PRN
  Administered 2019-10-01: 2 mg via INTRAVENOUS
  Filled 2019-10-01: qty 1

## 2019-10-01 MED ORDER — MORPHINE SULFATE ER 30 MG PO TBCR
30.0000 mg | EXTENDED_RELEASE_TABLET | Freq: Three times a day (TID) | ORAL | Status: DC
  Administered 2019-10-01: 30 mg via ORAL
  Filled 2019-10-01: qty 1

## 2019-10-01 MED ORDER — DOCUSATE SODIUM 100 MG PO CAPS: 100.0000 mg | ORAL_CAPSULE | Freq: Every day | ORAL | Status: DC | PRN

## 2019-10-01 MED ORDER — SODIUM CHLORIDE 0.9 % IV BOLUS
1000.0000 mL | Freq: Once | INTRAVENOUS | Status: AC
  Administered 2019-10-01: 1000 mL via INTRAVENOUS

## 2019-10-01 MED ORDER — ATORVASTATIN CALCIUM 20 MG PO TABS: 40.0000 mg | ORAL_TABLET | Freq: Every day | ORAL | Status: DC

## 2019-10-01 MED ORDER — LACTULOSE 10 GM/15ML PO SOLN: 20.0000 g | Freq: Every day | ORAL | Status: DC

## 2019-10-01 MED ORDER — POTASSIUM CHLORIDE CRYS ER 20 MEQ PO TBCR: 40.0000 meq | EXTENDED_RELEASE_TABLET | Freq: Once | ORAL | Status: DC

## 2019-10-01 MED ORDER — OXYCODONE HCL 5 MG PO TABS
10.0000 mg | ORAL_TABLET | ORAL | Status: DC | PRN
  Administered 2019-10-01 – 2019-10-02 (×3): 10 mg via ORAL
  Filled 2019-10-01 (×3): qty 2

## 2019-10-01 MED ORDER — LORAZEPAM 2 MG/ML IJ SOLN
1.0000 mg | Freq: Once | INTRAMUSCULAR | Status: AC
  Administered 2019-10-01: 1 mg via INTRAVENOUS
  Filled 2019-10-01: qty 1

## 2019-10-01 MED ORDER — LORAZEPAM 2 MG/ML IJ SOLN: 1.0000 mg | INTRAMUSCULAR | Status: DC | PRN

## 2019-10-01 MED ORDER — SENNOSIDES-DOCUSATE SODIUM 8.6-50 MG PO TABS: 1.0000 | ORAL_TABLET | Freq: Every evening | ORAL | Status: DC | PRN

## 2019-10-01 MED ORDER — LORAZEPAM 0.5 MG PO TABS: 0.5000 mg | ORAL_TABLET | Freq: Every day | ORAL | Status: DC

## 2019-10-01 MED ORDER — HALOPERIDOL 1 MG PO TABS
1.0000 mg | ORAL_TABLET | Freq: Every day | ORAL | Status: DC
  Filled 2019-10-01: qty 1

## 2019-10-01 MED ORDER — PANTOPRAZOLE SODIUM 40 MG IV SOLR
40.0000 mg | Freq: Two times a day (BID) | INTRAVENOUS | Status: DC
  Administered 2019-10-01: 40 mg via INTRAVENOUS
  Filled 2019-10-01: qty 40

## 2019-10-01 MED ORDER — TRAZODONE HCL 50 MG PO TABS: 50.0000 mg | ORAL_TABLET | Freq: Every day | ORAL | Status: DC

## 2019-10-01 NOTE — Progress Notes (Signed)
BRIEF HPI: Kenneth Maxwell is a 64 y.o. 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  SIGNIFICANT EVENTS/STUDIES 9/28: Patient presenting to the ED with altered mental status 9/28: CT head obtained which showed probable acute/early subacute infarction within the left perioccipital lobes and hypodensity within the right superior cerebellar hemisphere. 9/28: CT angio chest obtained and showed bilateral pulmonary embolism without right heart strain 9/28: Patient taken to MRI/MR angio head 9/28 @22 :30: Rapid response called by MRI staff for unresponsiveness.  Per MRI patient complained of dizziness, nausea and vomited then lost consciousness briefly.  On arrival to MRI patient had regained consciousness and maintaining his airway.  He was brought back to the ED room.  Initial vital signs on arrival to the room, afebrile with blood pressure 133/72 mm Hg and pulse rate 103 beats/min, RR 28, sats 100% on room air. There were no focal neurological deficits; he was lethargic.   Family Communication: Patient's wife updated at the bedside.  Had long discussion with patient's wife and explained to her that patient has bilateral pulmonary embolism noted and CTA Chest as well as possible stroke on CT head.  He was on Eliquis for bilateral DVT however this has been held due to questionable hemoptysis.  I also explained to her that it would be difficult to restart anticoagulation at this time for bilateral PE given hemoptysis with significant drop in hemoglobin, thrombocytopenia and possible stroke on CT head.  Explained the risk of bleeding given the above and possible hemorrhagic transformation.  Patient's wife verbalized understanding. Patient is  currently under hospice care for stage IV pancreatic cancer.  At this time she would not like to proceed with aggressive treatment.  She has requested possible meeting tomorrow with hospice/palliative to decide if they need to transition to comfort care or back home with hospice. Will continue with current treatment/management pending disposition plan tomorrow    Rufina Falco, BSN, MSN, DNP, CCRN,FNP-C  [facility] Nurse Practitioner  [facility]

## 2019-10-01 NOTE — ED Notes (Signed)
Patient transported to CT 

## 2019-10-01 NOTE — H&P (Addendum)
History and Physical    Kenneth Maxwell ZDG:[id number] DOB: 1955/05/30 DOA: 09/05/2019  Referring MD/NP/PA:   PCP: Olin Hauser, DO   Patient coming from:  The patient is coming from home.  At baseline, pt is independent for most of ADL.        Chief Complaint: AMS and questionable hematemesis   HPI: Kenneth Maxwell is a 64 y.o. 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  Per his wife, pt become confused today.  Patient was combative en route per EMS.  Patient received 1 dose of Haldol of 5 mg in ED. His mental status has improved.  When I saw patient in ED, he is alert, oriented x3.  He answered all questions appropriately.  Patient moves all extremities normally.  No facial droop or slurred speech.  Patient states that he vomited once with pink-colored material, initially he thought to be bloody, but then he states that he believes it is likely due liquid morphine color which is red.  Patient has nausea, abdominal pain, no diarrhea.  No symptoms of UTI.  His last dose of Eliquis was this morning.   He reports to nurse that he has chest pain, but denies chest pain to me.  No cough, shortness of breath, fever or chills. Patient states that he stopped drinking alcohol 6 months ago.   ED Course: pt was found to have WBC 13.1, hemoglobin 9.5 (10.4 on 06/19/19), ammonia 51, ammonia level 51 pending Covid PCR, AKI with creatinine 1.28, BUN 15, potassium 3.3, blood pressure 89/65 --> 130/90 1:03 liter normal saline bolus, heart rate 63, RR 43 --> 18, oxygen saturation 100% on room air.  Patient is admitted to [facility] bed as inpatient  CT-head showed: 1. Cortical/subcortical hypodensity measuring 4.1 x 1.5 cm  within the left parietooccipital lobes with an appearance most consistent with acute/early subacute infarction.  2. 1.5 cm focus of hypodensity within the right superior cerebellar hemisphere, likely reflecting a subacute or chronic infarct.  3. Background mild generalized cerebral atrophy and chronic small vessel ischemic disease.  Review of Systems:   General: no fevers, chills, no body weight gain, has fatigue HEENT: no blurry vision, hearing changes or sore throat Respiratory: no dyspnea, coughing, wheezing CV: no chest pain, no palpitations GI: has nausea, vomiting, questionable hematemesis, abdominal pain, no diarrhea, constipation GU: no dysuria, burning on urination, increased urinary frequency, hematuria  Ext: no leg edema Neuro: no unilateral weakness, numbness, or tingling, no vision change or hearing loss Skin: no rash, no skin tear. MSK: No muscle spasm, no deformity, no limitation of range of movement in spin Heme: No easy bruising.  Travel history: No recent long distant travel.  Allergy:  Allergies  Allergen Reactions  . Codeine Rash and Other (See Comments)    Past Medical History:  Diagnosis Date  . Alcohol abuse   . Arthritis   . Blood clotting disorder ([place])   . Chronic renal disease   . Encounter for antineoplastic chemotherapy 05/24/2019  . H/O urinary retention   . Hematuria, gross   . Hypertension   . Pancreatic cancer metastasized to liver ([place]) 04/2019  . Renal cell carcinoma ([place]) 11/2013   kidney cancer s/p R nephrectomy    Past Surgical History:  Procedure Laterality Date  .  ERCP N/A 04/28/2019   Procedure: ENDOSCOPIC RETROGRADE CHOLANGIOPANCREATOGRAPHY (ERCP);  Surgeon: Lucilla Lame, MD;  Location: [facility] ENDOSCOPY;  Service: Endoscopy;  Laterality: N/A;  . ERCP N/A 06/13/2019   Procedure: ENDOSCOPIC RETROGRADE CHOLANGIOPANCREATOGRAPHY (ERCP);  Surgeon: Lucilla Lame, MD;  Location: [facility] ENDOSCOPY;  Service: Endoscopy;  Laterality: N/A;  .  kidney removed Right   . liver stent Right   . NEPHRECTOMY RADICAL Right 11/2013   R side kidney cancer surgery, non adrenal sparing  . PERIPHERAL VASCULAR THROMBECTOMY Right 09/20/2019   Procedure: PERIPHERAL VASCULAR THROMBECTOMY;  Surgeon: Katha Cabal, MD;  Location: [place] CV LAB;  Service: Cardiovascular;  Laterality: Right;  . PORTA CATH INSERTION N/A 05/20/2019   Procedure: PORTA CATH INSERTION;  Surgeon: Algernon Huxley, MD;  Location: [place] CV LAB;  Service: Cardiovascular;  Laterality: N/A;    Social History:  reports that he has been smoking cigarettes. He has a 50.00 pack-year smoking history. He has never used smokeless tobacco. He reports previous alcohol use of about 49.0 standard drinks of alcohol per week. He reports current drug use. Drug: Marijuana.  Family History:  Family History  Problem Relation Age of Onset  . Hypertension Mother   . Diabetes Mother   . Hypertension Father   . Heart attack Father   . Cancer Neg Hx      Prior to Admission medications   Medication Sig Start Date End Date Taking? Authorizing Provider  apixaban (ELIQUIS) 5 MG TABS tablet Take 1 tablet (5mg ) twice daily Patient taking differently: Take 5 mg by mouth 2 (two) times daily.  09/19/19  Yes Borders, Kirt Boys, NP  docusate sodium (COLACE) 100 MG capsule Take 100 mg by mouth 3 (three) times daily.   Yes [provider]  haloperidol (HALDOL) 0.5 MG tablet Take 1 mg by mouth at bedtime.   Yes [provider]  LORazepam (ATIVAN) 0.5 MG tablet Take 1 tablet (0.5 mg total) by mouth at bedtime as needed for sleep. Patient taking differently: Take 0.5 mg by mouth at bedtime.  06/21/19  Yes Earlie Server, MD  morphine (MS CONTIN) 30 MG 12 hr tablet Take 1 tablet (30 mg total) by mouth every 8 (eight) hours. 09/23/19  Yes Borders, Kirt Boys, NP  Multiple Vitamin (MULTIVITAMIN WITH MINERALS) TABS tablet Take 1 tablet by mouth daily. 04/30/19  Yes Mercy Riding, MD  omeprazole  (PRILOSEC) 20 MG capsule Take 1 capsule (20 mg total) by mouth 2 (two) times daily before a meal. 05/27/19  Yes Earlie Server, MD  traZODone (DESYREL) 50 MG tablet Take 1 tablet (50 mg total) by mouth at bedtime. 07/18/19  Yes Earlie Server, MD  amLODipine (NORVASC) 10 MG tablet Take 1 tablet (10 mg total) by mouth daily. Patient not taking: Reported on 06/19/2019 09/13/18   Olin Hauser, DO  dexamethasone (DECADRON) 4 MG tablet Take 2 tablets (8 mg total) by mouth daily. Start the day after chemotherapy for 3 days. Take with food. Patient not taking: Reported on 06/19/2019 05/24/19   Earlie Server, MD  lidocaine-prilocaine (EMLA) cream Apply to affected area once Patient not taking: Reported on 06/19/2019 05/17/19   Earlie Server, MD  loperamide (IMODIUM A-D) 2 MG tablet Take 2 at onset of diarrhea, then 1 every 2hrs until 12hr without a BM. May take 2 tab every 4hrs at bedtime. If diarrhea recurs repeat. Patient not taking: Reported on 06/19/2019 05/17/19   Earlie Server, MD  losartan (COZAAR) 100 MG tablet Take 1 tablet (  100 mg total) by mouth daily. Patient not taking: Reported on 06/19/2019 09/13/18   Olin Hauser, DO  Oxycodone HCl 10 MG TABS Take 1-2 tablets (10-20 mg total) by mouth every 3 (three) hours as needed. 09/23/19   Borders, Kirt Boys, NP  prochlorperazine (COMPAZINE) 10 MG tablet TAKE 1 TABLET BY MOUTH EVERY 6 HOURS AS NEEDED NAUSEA AND VOMITING Patient taking differently: Take 10 mg by mouth in the morning and at bedtime.  08/01/19   Verlon Au, NP  senna (SENOKOT) 8.6 MG TABS tablet Take 2 tablets (17.2 mg total) by mouth daily. Patient not taking: Reported on 09/20/2019 05/31/19   Earlie Server, MD  spironolactone (ALDACTONE) 25 MG tablet Take 1 tablet (25 mg total) by mouth daily. Patient not taking: Reported on 06/19/2019 04/30/19   Mercy Riding, MD  prochlorperazine (COMPAZINE) 10 MG tablet Take 1 tablet (10 mg total) by mouth every 6 (six) hours as needed (Nausea or vomiting). 05/17/19   Earlie Server, MD    Physical Exam: Vitals:   09/23/2019 1630 09/11/2019 1700 09/10/2019 1730 09/23/2019 1800  BP: 111/75 (!) 130/93 134/76 (!) 133/99  Pulse: 85 98 (!) 114   Resp:   (!) 41 (!) 29  Temp:    97.9 F (36.6 C)  TempSrc:    Oral  SpO2: 99% 100% 99% 100%  Weight:      Height:       General: Not in acute distress HEENT:       Eyes: PERRL, EOMI, no scleral icterus.       ENT: No discharge from the ears and nose, no pharynx injection, no tonsillar enlargement.        Neck: No JVD, no bruit, no mass felt. Heme: No neck lymph node enlargement. Cardiac: S1/S2, RRR, No murmurs, No gallops or rubs. Respiratory:  No rales, wheezing, rhonchi or rubs. GI: Soft, nondistended, has upper abdominal tenderness, no rebound pain, no organomegaly, BS present. GU: No hematuria Ext: No pitting leg edema bilaterally. 2+DP/PT pulse bilaterally. Musculoskeletal: No joint deformities, No joint redness or warmth, no limitation of ROM in spin. Skin: No rashes.  Neuro: Alert, oriented X3, cranial nerves II-XII grossly intact, moves all extremities normally. Muscle strength 5/5 in all extremities, sensation to light touch intact.  Psych: Patient is not psychotic, no suicidal or hemocidal ideation.  Labs on Admission: I have personally reviewed following labs and imaging studies  CBC: Recent Labs  Lab 09/30/2019 1507  WBC 20.1*  NEUTROABS 15.3*  HGB 9.5*  HCT 28.5*  MCV 82.8  PLT 062   Basic Metabolic Panel: Recent Labs  Lab 09/24/2019 1507  NA 133*  K 3.3*  CL 98  CO2 16*  GLUCOSE 215*  BUN 15  CREATININE 1.28*  CALCIUM 8.6*   GFR: Estimated Creatinine Clearance: 58.3 mL/min (A) (by C-G formula based on SCr of 1.28 mg/dL (H)). Liver Function Tests: Recent Labs  Lab 09/09/2019 1507  AST 36  ALT 20  ALKPHOS 199*  BILITOT 0.8  PROT 6.2*  ALBUMIN 2.6*   Recent Labs  Lab 09/05/2019 1507  LIPASE 16   Recent Labs  Lab 10/01/2019 1538  AMMONIA 51*   Coagulation Profile: No results for  input(s): INR, PROTIME in the last 168 hours. Cardiac Enzymes: No results for input(s): CKTOTAL, CKMB, CKMBINDEX, TROPONINI in the last 168 hours. BNP (last 3 results) No results for input(s): PROBNP in the last 8760 hours. HbA1C: No results for input(s): HGBA1C in the last 72  hours. CBG: No results for input(s): GLUCAP in the last 168 hours. Lipid Profile: No results for input(s): CHOL, HDL, LDLCALC, TRIG, CHOLHDL, LDLDIRECT in the last 72 hours. Thyroid Function Tests: No results for input(s): TSH, T4TOTAL, FREET4, T3FREE, THYROIDAB in the last 72 hours. Anemia Panel: No results for input(s): VITAMINB12, FOLATE, FERRITIN, TIBC, IRON, RETICCTPCT in the last 72 hours. Urine analysis:    Component Value Date/Time   COLORURINE AMBER (A) 06/07/2019 0947   APPEARANCEUR CLEAR (A) 06/07/2019 0947   APPEARANCEUR Clear 02/11/2015 1004   LABSPEC 1.011 06/07/2019 0947   LABSPEC 1.005 11/07/2013 1115   PHURINE 6.0 06/07/2019 0947   GLUCOSEU NEGATIVE 06/07/2019 0947   GLUCOSEU Negative 11/07/2013 1115   HGBUR NEGATIVE 06/07/2019 0947   BILIRUBINUR MODERATE (A) 06/07/2019 0947   BILIRUBINUR Negative 02/11/2015 1004   BILIRUBINUR Negative 11/07/2013 1115   Onyx 06/07/2019 0947   PROTEINUR 30 (A) 06/07/2019 0947   NITRITE NEGATIVE 06/07/2019 0947   LEUKOCYTESUR NEGATIVE 06/07/2019 0947   LEUKOCYTESUR Negative 11/07/2013 1115   Sepsis Labs: @LABRCNTIP (procalcitonin:4,lacticidven:4) ) Recent Results (from the past 240 hour(s))  Respiratory Panel by RT PCR (Flu A&B, Covid) - Nasopharyngeal Swab     Status: None   Collection Time: 09/09/2019  5:08 PM   Specimen: Nasopharyngeal Swab  Result Value Ref Range Status   SARS Coronavirus 2 by RT PCR NEGATIVE NEGATIVE Final    Comment: (NOTE) SARS-CoV-2 target nucleic acids are NOT DETECTED.  The SARS-CoV-2 RNA is generally detectable in upper respiratoy specimens during the acute phase of infection. The lowest concentration of  SARS-CoV-2 viral copies this assay can detect is 131 copies/mL. A negative result does not preclude SARS-Cov-2 infection and should not be used as the sole basis for treatment or other patient management decisions. A negative result may occur with  improper specimen collection/handling, submission of specimen other than nasopharyngeal swab, presence of viral mutation(s) within the areas targeted by this assay, and inadequate number of viral copies (<131 copies/mL). A negative result must be combined with clinical observations, patient history, and epidemiological information. The expected result is Negative.  Fact Sheet for Patients:  PinkCheek.be  Fact Sheet for Healthcare Providers:  GravelBags.it  This test is no t yet approved or cleared by the [place] FDA and  has been authorized for detection and/or diagnosis of SARS-CoV-2 by FDA under an Emergency Use Authorization (EUA). This EUA will remain  in effect (meaning this test can be used) for the duration of the COVID-19 declaration under Section 564(b)(1) of the Act, 21 U.S.C. section 360bbb-3(b)(1), unless the authorization is terminated or revoked sooner.     Influenza A by PCR NEGATIVE NEGATIVE Final   Influenza B by PCR NEGATIVE NEGATIVE Final    Comment: (NOTE) The Xpert Xpress SARS-CoV-2/FLU/RSV assay is intended as an aid in  the diagnosis of influenza from Nasopharyngeal swab specimens and  should not be used as a sole basis for treatment. Nasal washings and  aspirates are unacceptable for Xpert Xpress SARS-CoV-2/FLU/RSV  testing.  Fact Sheet for Patients: PinkCheek.be  Fact Sheet for Healthcare Providers: GravelBags.it  This test is not yet approved or cleared by the [place] FDA and  has been authorized for detection and/or diagnosis of SARS-CoV-2 by  FDA under an Emergency Use  Authorization (EUA). This EUA will remain  in effect (meaning this test can be used) for the duration of the  Covid-19 declaration under Section 564(b)(1) of the Act, 21  U.S.C. section 360bbb-3(b)(1), unless the  authorization is  terminated or revoked. Performed at [facility], [address] [place], [place] [address]      Radiological Exams on Admission: CT Head Wo Contrast  Result Date: 09/11/2019 CLINICAL DATA:  Altered mental status. Additional history provided: Patient reportedly with bloody emesis at home, altered mental status, history of pancreatic cancer. EXAM: CT HEAD WITHOUT CONTRAST TECHNIQUE: Contiguous axial images were obtained from the base of the skull through the vertex without intravenous contrast. COMPARISON:  No pertinent prior exams are available for comparison. FINDINGS: Brain: Mild generalized cerebral atrophy. There is a focus of cortical/subcortical hypodensity within the left parietooccipital lobes measuring 4.1 x 1.5 cm in transaxial dimensions with an appearance most consistent with acute/subacute infarct (series 2, image 16). Background mild ill-defined hypoattenuation within the cerebral white matter is nonspecific, but consistent with chronic small vessel ischemic disease. 1.5 cm focus of hypodensity within the superior right cerebellum which has an appearance most suggestive of a subacute to chronic infarct. There is no acute intracranial hemorrhage. No extra-axial fluid collection. No evidence of intracranial mass. No midline shift. Vascular: No hyperdense vessel.  Atherosclerotic calcifications. Skull: Normal. Negative for fracture or focal lesion. Sinuses/Orbits: Visualized orbits show no acute finding. No significant paranasal sinus disease or mastoid effusion at the imaged levels. These results were called by telephone at the time of interpretation on 09/05/2019 at 3:58 pm to provider [facility] , who verbally acknowledged these results.  IMPRESSION: Cortical/subcortical hypodensity measuring 4.1 x 1.5 cm within the left parietooccipital lobes with an appearance most consistent with acute/early subacute infarction. 1.5 cm focus of hypodensity within the right superior cerebellar hemisphere, likely reflecting a subacute or chronic infarct. Background mild generalized cerebral atrophy and chronic small vessel ischemic disease. Electronically Signed   By: Kellie Simmering DO   On: 09/21/2019 15:58     EKG: Independently reviewed.  Not done in ED, will get one.   Assessment/Plan Principal Problem:   Stroke [facility]) Active Problems:   Hypertension   Tobacco use   Alcohol abuse   Hypokalemia   Leukocytosis   Pancreatic adenocarcinoma (HCC)   AKI (acute kidney injury) ([place])   Hematemesis_questionable   Acute metabolic encephalopathy   Anxiety   Hypotension   Chest pain   DVT (deep venous thrombosis) (HCC)   Possible stroke:  CT-head showed cortical/subcortical hypodensity measuring 4.1 x 1.5 cm within the left parietooccipital lobes with an appearance most consistent with acute/early subacute infarction; and 1.5 cm focus of hypodensity within the right superior cerebellar hemisphere, likely reflecting a subacute or chronic infarct.  Patient does not have focal neuro deficit on physical examination.  - Admit to MedSurg bed as inpatient - Obtain MRI/MRA  - Check carotid dopplers  - will not give ASA since we are not sure if he had hematemesis follow-up - Started Lipitor - fasting lipid panel and HbA1c  - 2D transthoracic echocardiography  - swallowing screen. If fails, will get SLP - PT/OT consult - need to consult neuro in AM if MRI shows stroke. Neuro is not available after 4:00 PM  Hypertension --hold blood pressure medications in the setting of possible stroke -IV hydralazine as needed for SBP>175 (due to chest pain, will not allow Bp up to 220)  Tobacco use -Nicotine patch  Alcohol abuse -in  remission  Hypokalemia: K=3.3 on admission. - Repleted - Check Mg level  Leukocytosis: Patient is has a chronic leukocytosis, recent WBC 28.6 on 06/19/2019.  Today WBC 20.1.  No fever.  No source of infection identified.  Likely reactive. -Follow-up with CBC  Metastasized pancreatic adenocarcinoma ([place]) -Under hospice care -Continue home MS Contin and oxycodone for pain  AKI (acute kidney injury) ([place]): Creatinine 1.28, BUN 15.  Likely due to dehydration -IV fluid: 2 L normal saline, followed by 100 cc/h -Avoid using renal toxic medications -Hold diuretics, spironolactone  Hematemesis_questionable: Hemoglobin 9.5 (10.4 on 06/19/2019), slightly dropped.  Not sure if patient had a true hematemesis. -Protonix IV 40 mg twice daily -Check INR/PTT/type screen -Follow-up CBC every 6 hours -hold Eliquis - Zofran IV for nausea - Avoid NSAIDs and SQ heparin - Maintain IV access (2 large bore IVs if possible). - Monitor closely and follow q6h cbc, transfuse as necessary, if Hgb<7.0 - La[facility]: INR, PTT and type screen  Acute metabolic encephalopathy: has resolved.  May be due to possible stroke.  His ammonia is elevated at 51 -Frequent neuro check -Start lactulose  Anxiety -As needed Ativan  Hypotension: Likely due to dehydration.  Blood pressure responded to IV fluid quickly. -IV fluid as above  History of DVT: -Hold Eliquis due to questionable hematemesis  Chest pain: Etiology is not clear. -Trend troponin -Check A1c, FLP -Follow-up CT angiogram to rule out PE -Started Lipitor for stroke -will not give ASA since we are not sure if he had hematemesis follow-up     DVT ppx: SCD Code Status: DNR Family Communication:    Yes, patient's wife   at bed side Disposition Plan:  Anticipate discharge back to previous environment Consults called: none  Admission status: Med-surg bed as inpt     Patient has multiple comorbidities, including metastasized pancreatic cancer, now presents  with possible stroke, questionable hematemesis.  Patient also has altered mental status, hypokalemia, AKI and hypotension.  His presentation is highly complicated.  Patient is at high risk of deteriorating.  Will need to be treated in hospital for at least 2 days.     Date of Service 09/20/2019    Ivor Costa [facility]   If 7PM-7AM, please contact night-coverage [email or web address] 10/03/2019, 7:32 PM

## 2019-10-01 NOTE — ED Notes (Signed)
Pt with episode after exiting MRI, felt dizzy, vomited then lost consciousness briefly; rapid response was called, pt responsive on arrival of team; brought back immediately to ED room with VSS

## 2019-10-01 NOTE — Progress Notes (Signed)
[facility] hospital liaison note:  Patient is currently followed by [facility] hospice services at home with a hospice diagnosis of pancreatic cancer. He is a DNR code with out of facility DNR in place in the chart.  Family activated EMS when patient complained of chest pain with reported bloody emesis. Hospice was notified after EMS was called. Patient seen sitting up on the ED stretcher, alert, interactive and restless. He continued to complain of nausea and did receive a PRN dose of IV haldol during visit. No active vomiting during visit. Patient's wife arrived, emotional support given. She reported some ongoing intermittent confusion over the past few days. EDP MD Dr. Archie Balboa in during visit to discuss the results of patient's head CT. Dr. Archie Balboa suggested admission with plans for stroke work up.  Both patient and his wife voiced agreement with this plan. Hospice team updated. Will continue to follow. Please call with any hospice related questions or concerns. Flo Shanks BSN, RN, [facility] [phone]

## 2019-10-01 NOTE — ED Notes (Signed)
Patient transported to MRI 

## 2019-10-01 NOTE — ED Provider Notes (Signed)
[facility] Emergency Department Provider Note    ____________________________________________   I have reviewed the triage vital signs and the nursing notes.   HISTORY  Chief Complaint Emesis (bloody ) and Altered Mental Status   History limited by and level 5 caveat due to: AMS   HPI Kenneth Maxwell is a 64 y.o. male who presents to the emergency department today because of concern for altered mental status and possible hematemesis. The patient states he is unsure why he is in the emergency department. He recalls taking some of his liquid morphine and then feeling nauseous. He believes his liquid morphine is red so that might be why they thought there was blood in it. Says he has a history of cancer so typically has abdominal pain. Did have one episode when he started to have sweating.   Records reviewed. Per medical record review patient has a history of pancreatic cancer with liver mets. On eliquis.   Past Medical History:  Diagnosis Date  . Alcohol abuse   . Arthritis   . Blood clotting disorder ([place])   . Chronic renal disease   . Encounter for antineoplastic chemotherapy 05/24/2019  . H/O urinary retention   . Hematuria, gross   . Hypertension   . Pancreatic cancer metastasized to liver ([place]) 04/2019  . Renal cell carcinoma ([place]) 11/2013   kidney cancer s/p R nephrectomy    Patient Active Problem List   Diagnosis Date Noted  . Malignant neoplasm of head of pancreas ([place])   . Obstruction of bile duct   . Fitting and adjustment of gastrointestinal appliance and device   . 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 Neoplasm related pain 06/07/2019  . Hyponatremia 06/07/2019  . Goals of care, counseling/discussion 05/24/2019  . Encounter for antineoplastic chemotherapy 05/24/2019  . Neoplasm of digestive system   . Common bile duct dilatation   . Obstructive jaundice   . Pancreatic mass   . Hypokalemia 04/26/2019  . Abnormal LFTs  04/26/2019  . Leukocytosis 04/26/2019  . Diarrhea 04/26/2019  . Pancreatic adenocarcinoma ([place]) 04/26/2019  . Liver disease   . S/p nephrectomy 10/11/2016  . Arthritis 08/04/2014  . History of renal cell carcinoma 08/04/2014  . H/O cardiovascular disorder 08/04/2014  . Compulsive tobacco user syndrome 08/04/2014  . Hypertension 06/25/2014  . Tobacco use 06/25/2014  . Alcohol abuse 06/25/2014    Past Surgical History:  Procedure Laterality Date  . ERCP N/A 04/28/2019   Procedure: ENDOSCOPIC RETROGRADE CHOLANGIOPANCREATOGRAPHY (ERCP);  Surgeon: Lucilla Lame, MD;  Location: [facility] ENDOSCOPY;  Service: Endoscopy;  Laterality: N/A;  . ERCP N/A 06/13/2019   Procedure: ENDOSCOPIC RETROGRADE CHOLANGIOPANCREATOGRAPHY (ERCP);  Surgeon: Lucilla Lame, MD;  Location: [facility] ENDOSCOPY;  Service: Endoscopy;  Laterality: N/A;  . kidney removed Right   . liver stent Right   . NEPHRECTOMY RADICAL Right 11/2013   R side kidney cancer surgery, non adrenal sparing  . PERIPHERAL VASCULAR THROMBECTOMY Right 09/20/2019   Procedure: PERIPHERAL VASCULAR THROMBECTOMY;  Surgeon: Katha Cabal, MD;  Location: [place] CV LAB;  Service: Cardiovascular;  Laterality: Right;  . PORTA CATH INSERTION N/A 05/20/2019   Procedure: PORTA CATH INSERTION;  Surgeon: Algernon Huxley, MD;  Location: [place] [facility];  Service: Cardiovascular;  Laterality: N/A;    Prior to Admission medications   Medication Sig Start Date End Date Taking? Authorizing Provider  amLODipine (NORVASC) 10 MG tablet Take 1 tablet (10 mg total) by mouth daily. Patient not taking: Reported on 06/19/2019  09/13/18   Karamalegos, Devonne Doughty, DO  apixaban (ELIQUIS) 5 MG TABS tablet Take 1 tablet (5mg ) twice daily 09/19/19   Borders, Kirt Boys, NP  dexamethasone (DECADRON) 4 MG tablet Take 2 tablets (8 mg total) by mouth daily. Start the day after chemotherapy for 3 days. Take with food. Patient not taking: Reported on 06/19/2019 05/24/19   Earlie Server, MD   docusate sodium (COLACE) 100 MG capsule Take 100 mg by mouth 3 (three) times daily.    [provider]  haloperidol (HALDOL) 0.5 MG tablet Take 1 mg by mouth at bedtime.    [provider]  lidocaine-prilocaine (EMLA) cream Apply to affected area once Patient not taking: Reported on 06/19/2019 05/17/19   Earlie Server, MD  loperamide (IMODIUM A-D) 2 MG tablet Take 2 at onset of diarrhea, then 1 every 2hrs until 12hr without a BM. May take 2 tab every 4hrs at bedtime. If diarrhea recurs repeat. Patient not taking: Reported on 06/19/2019 05/17/19   Earlie Server, MD  LORazepam (ATIVAN) 0.5 MG tablet Take 1 tablet (0.5 mg total) by mouth at bedtime as needed for sleep. Patient taking differently: Take 0.5 mg by mouth at bedtime.  06/21/19   Earlie Server, MD  losartan (COZAAR) 100 MG tablet Take 1 tablet (100 mg total) by mouth daily. Patient not taking: Reported on 06/19/2019 09/13/18   Olin Hauser, DO  morphine (MS CONTIN) 30 MG 12 hr tablet Take 1 tablet (30 mg total) by mouth every 8 (eight) hours. 09/23/19   Borders, Kirt Boys, NP  Multiple Vitamin (MULTIVITAMIN WITH MINERALS) TABS tablet Take 1 tablet by mouth daily. 04/30/19   [place] Riding, MD  omeprazole (PRILOSEC) 20 MG capsule Take 1 capsule (20 mg total) by mouth 2 (two) times daily before a meal. 05/27/19   Earlie Server, MD  Oxycodone HCl 10 MG TABS Take 1-2 tablets (10-20 mg total) by mouth every 3 (three) hours as needed. 09/23/19   Borders, Kirt Boys, NP  Polyethylene Glycol 3350 (MIRALAX PO) Take by mouth. BID Patient not taking: Reported on 06/19/2019    [provider]  prochlorperazine (COMPAZINE) 10 MG tablet TAKE 1 TABLET BY MOUTH EVERY 6 HOURS AS NEEDED NAUSEA AND VOMITING Patient taking differently: Take 10 mg by mouth in the morning and at bedtime.  08/01/19   Verlon Au, NP  senna (SENOKOT) 8.6 MG TABS tablet Take 2 tablets (17.2 mg total) by mouth daily. Patient taking differently: Take 2 tablets by mouth in  the morning and at bedtime.  05/31/19   Earlie Server, MD  spironolactone (ALDACTONE) 25 MG tablet Take 1 tablet (25 mg total) by mouth daily. Patient not taking: Reported on 06/19/2019 04/30/19   [place] Riding, MD  traZODone (DESYREL) 50 MG tablet Take 1 tablet (50 mg total) by mouth at bedtime. 07/18/19   Earlie Server, MD  prochlorperazine (COMPAZINE) 10 MG tablet Take 1 tablet (10 mg total) by mouth every 6 (six) hours as needed (Nausea or vomiting). 05/17/19   Earlie Server, MD    Allergies Codeine  Family History  Problem Relation Age of Onset  . Hypertension Mother   . Diabetes Mother   . Hypertension Father   . Heart attack Father   . Cancer Neg Hx     Social History Social History   Tobacco Use  . Smoking status: Current Every Day Smoker    Packs/day: 1.00    Years: 50.00    Pack years: 50.00  Types: Cigarettes  . Smokeless tobacco: Never Used  . Tobacco comment: down to >0.5ppd  Vaping Use  . Vaping Use: Never used  Substance Use Topics  . Alcohol use: Not Currently    Alcohol/week: 49.0 standard drinks    Types: 49 Cans of beer per week    Comment: h/o heavy use, quit about 3 months ago (before diagnosis)  . Drug use: Yes    Types: Marijuana    Review of Systems Constitutional: No fever/chills Eyes: No visual changes. ENT: No sore throat. Cardiovascular: Denies chest pain. Respiratory: Denies shortness of breath. Gastrointestinal: Positive for abdominal pain. Positive for possible bloody vomiting.    Genitourinary: Negative for dysuria. Musculoskeletal: Negative for back pain. Skin: Negative for rash. Neurological: Positive for confusion.  ____________________________________________   PHYSICAL EXAM:  VITAL SIGNS: ED Triage Vitals  Enc Vitals Group     BP 09/30/2019 1502 116/78     Pulse --      Resp --      Temp --      Temp src --      SpO2 09/08/2019 1458 100 %     Weight 09/05/2019 1504 150 lb (68 kg)     Height 09/23/2019 1504 5\' 11"  (1.803 m)    Constitutional: Alert and oriented.  Eyes: Conjunctivae are normal.  ENT      Head: Normocephalic and atraumatic.      Nose: No congestion/rhinnorhea.      Mouth/Throat: Mucous membranes are moist.      Neck: No stridor. Hematological/Lymphatic/Immunilogical: No cervical lymphadenopathy. Cardiovascular: Normal rate, regular rhythm.  No murmurs, rubs, or gallops.  Respiratory: Normal respiratory effort without tachypnea nor retractions. Breath sounds are clear and equal bilaterally. No wheezes/rales/rhonchi. Gastrointestinal: Soft and non tender. No rebound. No guarding.  Genitourinary: Deferred Musculoskeletal: Normal range of motion in all extremities. No lower extremity edema. Neurologic:  Normal speech and language. No gross focal neurologic deficits are appreciated.  Skin:  Skin is warm, dry and intact. No rash noted. Psychiatric: Mood and affect are normal. Speech and behavior are normal. Patient exhibits appropriate insight and judgment.  ____________________________________________    LABS (pertinent positives/negatives)  CBC wbc 20.1, hgb 9.5, plt 187 Lipase 16 CMP na 133, k 3.3, glu 215, cr 1.28 Ammonia 51 ____________________________________________   EKG  I, Nance Pear, attending physician, personally viewed and interpreted this EKG  EKG Time: 1457 Rate: 139 Rhythm: sinus tachycardia with PAC, PVC Axis: normal Intervals: qtc 430 QRS: narrow, q waves V1 ST changes: no st elevation Impression: abnormal ekg  ____________________________________________    RADIOLOGY  CT head Multiple acute/subacute infarcts  I, Nance Pear, personally discussed these images and results by phone with the on-call radiologist and used this discussion as part of my medical decision making.    ____________________________________________   PROCEDURES  Procedures  ____________________________________________   INITIAL IMPRESSION / ASSESSMENT AND PLAN / ED  COURSE  Pertinent labs & imaging results that were available during my care of the patient were reviewed by me and considered in my medical decision making (see chart for details).   Patient presented to the emergency department today because of concern for altered mental status and potentially vomiting blood. The patient's wife was able to elaborate and states that he has been altered over the past couple of days. In terms of the vomiting, he had just taken his liquid morphine and wife states it is a dark purple. At this time unclear if the patient did have any  actual bloody emesis. The patient's head ct is concerning for cva. Discussed this finding with patient and wife. At this time will plan on admission for further work up and management.  ____________________________________________   FINAL CLINICAL IMPRESSION(S) / ED DIAGNOSES  Final diagnoses:  Cerebrovascular accident (CVA), unspecified mechanism ([place])     Note: This dictation was prepared with Diplomatic Services operational officer dictation. Any transcriptional errors that result from this process are unintentional     Nance Pear, MD 09/17/2019 1644

## 2019-10-01 NOTE — ED Notes (Signed)
[place] tech at bedside completing imaging

## 2019-10-01 NOTE — ED Triage Notes (Signed)
Pt reportedly with bloody emesis at home; altered mental status. Per EMS pt was combative en route and confused. On arrival to ED he is less combative, alert and oriented to person, place, situation

## 2019-10-02 ENCOUNTER — Other Ambulatory Visit: Payer: Self-pay

## 2019-10-02 ENCOUNTER — Encounter: Payer: Self-pay | Admitting: Internal Medicine

## 2019-10-02 DIAGNOSIS — D62 Acute posthemorrhagic anemia: Secondary | ICD-10-CM

## 2019-10-02 DIAGNOSIS — C259 Malignant neoplasm of pancreas, unspecified: Secondary | ICD-10-CM

## 2019-10-02 DIAGNOSIS — Z515 Encounter for palliative care: Secondary | ICD-10-CM

## 2019-10-02 DIAGNOSIS — G9341 Metabolic encephalopathy: Secondary | ICD-10-CM

## 2019-10-02 DIAGNOSIS — I959 Hypotension, unspecified: Secondary | ICD-10-CM

## 2019-10-02 DIAGNOSIS — I2699 Other pulmonary embolism without acute cor pulmonale: Secondary | ICD-10-CM

## 2019-10-02 DIAGNOSIS — I639 Cerebral infarction, unspecified: Principal | ICD-10-CM

## 2019-10-02 LAB — CBC
HCT: 18.6 % — ABNORMAL LOW (ref 39.0–52.0)
HCT: 12.7 % — CL (ref 39.0–52.0)
Hemoglobin: 6.5 g/dL — ABNORMAL LOW (ref 13.0–17.0)
Hemoglobin: 4.4 g/dL — CL (ref 13.0–17.0)
MCH: 28 pg (ref 26.0–34.0)
MCH: 28.4 pg (ref 26.0–34.0)
MCHC: 34.9 g/dL (ref 30.0–36.0)
MCHC: 34.6 g/dL (ref 30.0–36.0)
MCV: 80.2 fL (ref 80.0–100.0)
MCV: 81.9 fL (ref 80.0–100.0)
Platelets: 121 10*3/uL — ABNORMAL LOW (ref 150–400)
Platelets: 99 10*3/uL — ABNORMAL LOW (ref 150–400)
RBC: 2.32 MIL/uL — ABNORMAL LOW (ref 4.22–5.81)
RBC: 1.55 MIL/uL — ABNORMAL LOW (ref 4.22–5.81)
RDW: 16 % — ABNORMAL HIGH (ref 11.5–15.5)
RDW: 16.1 % — ABNORMAL HIGH (ref 11.5–15.5)
WBC: 21.2 10*3/uL — ABNORMAL HIGH (ref 4.0–10.5)
WBC: 29.5 10*3/uL — ABNORMAL HIGH (ref 4.0–10.5)
nRBC: 0 % (ref 0.0–0.2)
nRBC: 0.1 % (ref 0.0–0.2)

## 2019-10-02 LAB — TYPE AND SCREEN
ABO/RH(D): A POS
Antibody Screen: NEGATIVE
Unit division: 0

## 2019-10-02 LAB — MAGNESIUM: Magnesium: 1.7 mg/dL (ref 1.7–2.4)

## 2019-10-02 LAB — LIPID PANEL
Cholesterol: 137 mg/dL (ref 0–200)
HDL: 19 mg/dL — ABNORMAL LOW (ref >40)
LDL Cholesterol: 88 mg/dL (ref 0–99)
Total CHOL/HDL Ratio: 7.2 RATIO
Triglycerides: 149 mg/dL (ref <150)
VLDL: 30 mg/dL (ref 0–40)

## 2019-10-02 LAB — BPAM RBC
Blood Product Expiration Date: 202110272359
Unit Type and Rh: 6200

## 2019-10-02 LAB — TROPONIN I (HIGH SENSITIVITY): Troponin I (High Sensitivity): 551 ng/L (ref <18)

## 2019-10-02 LAB — PREPARE RBC (CROSSMATCH)

## 2019-10-02 LAB — HEMOGLOBIN A1C
Hgb A1c MFr Bld: 5.5 % (ref 4.8–5.6)
Mean Plasma Glucose: 111.15 mg/dL

## 2019-10-02 LAB — ABO/RH: ABO/RH(D): A POS

## 2019-10-02 MED ORDER — LACTATED RINGERS IV BOLUS
1000.0000 mL | Freq: Once | INTRAVENOUS | Status: AC
  Administered 2019-10-02: 1000 mL via INTRAVENOUS

## 2019-10-02 MED ORDER — GLYCOPYRROLATE 1 MG PO TABS
1.0000 mg | ORAL_TABLET | ORAL | Status: DC | PRN
  Filled 2019-10-02: qty 1

## 2019-10-02 MED ORDER — HALOPERIDOL LACTATE 5 MG/ML IJ SOLN: 0.5000 mg | INTRAMUSCULAR | Status: DC | PRN

## 2019-10-02 MED ORDER — POLYVINYL ALCOHOL 1.4 % OP SOLN
1.0000 [drp] | Freq: Four times a day (QID) | OPHTHALMIC | Status: DC | PRN
  Filled 2019-10-02: qty 15

## 2019-10-02 MED ORDER — MORPHINE 100MG IN NS 100ML (1MG/ML) PREMIX INFUSION
5.0000 mg/h | INTRAVENOUS | Status: DC
  Filled 2019-10-02: qty 100

## 2019-10-02 MED ORDER — SODIUM CHLORIDE 0.9% IV SOLUTION: Freq: Once | INTRAVENOUS | Status: DC

## 2019-10-02 MED ORDER — ACETAMINOPHEN 650 MG RE SUPP: 650.0000 mg | Freq: Four times a day (QID) | RECTAL | Status: DC | PRN

## 2019-10-02 MED ORDER — GLYCOPYRROLATE 0.2 MG/ML IJ SOLN
0.2000 mg | INTRAMUSCULAR | Status: DC | PRN
  Filled 2019-10-02: qty 1

## 2019-10-02 MED ORDER — HALOPERIDOL 0.5 MG PO TABS
0.5000 mg | ORAL_TABLET | ORAL | Status: DC | PRN
  Filled 2019-10-02: qty 1

## 2019-10-02 MED ORDER — ACETAMINOPHEN 325 MG PO TABS: 650.0000 mg | ORAL_TABLET | Freq: Four times a day (QID) | ORAL | Status: DC | PRN

## 2019-10-02 MED ORDER — BIOTENE DRY MOUTH MT LIQD: 15.0000 mL | OROMUCOSAL | Status: DC | PRN

## 2019-10-02 MED ORDER — HALOPERIDOL LACTATE 2 MG/ML PO CONC
0.5000 mg | ORAL | Status: DC | PRN
  Filled 2019-10-02: qty 0.3

## 2019-10-04 NOTE — Progress Notes (Signed)
Nutrition Brief Note  Patient identified on Malnutrition Screening Tool  Chart reviewed.  Pt now transitioning to comfort care.  No nutrition interventions warranted at this time.   Please consult as needed.   Lajuan Lines, RD, LDN Clinical Nutrition After Hours/Weekend Pager # in [place]

## 2019-10-04 NOTE — ED Notes (Signed)
Second attempt to call report. Floor advised that we will roll in ten minutes.

## 2019-10-04 NOTE — ED Notes (Addendum)
First attempt to call report to 1A

## 2019-10-04 NOTE — Death Summary Note (Signed)
DEATH SUMMARY   Patient Details  Name: Kenneth Maxwell MRN: [id number] DOB: 1955/11/11  Admission/Discharge Information   Admit Date:  Oct 08, 2019  Date of Death: Date of Death: 10-09-19  Time of Death: Time of Death: 0808  Length of Stay: 1  Referring Physician: Olin Hauser, DO   Reason(s) for Hospitalization  Brought into the hospital for acute metabolic encephalopathy and hematemesis.  Diagnoses  Preliminary cause of death:  Secondary Diagnoses (including complications and co-morbidities):  Principal Problem:   Acute CVA (cerebrovascular accident) ([place]) Active Problems:   Hypertension   Tobacco use   Alcohol abuse   Hypokalemia   Leukocytosis   Pancreatic adenocarcinoma (HCC)   End of life care   AKI (acute kidney injury) ([place])   Hematemesis_questionable   Acute metabolic encephalopathy   Anxiety   Hypotension   Chest pain   DVT (deep venous thrombosis) (HCC)   Pulmonary embolus ([place])   Acute blood loss anemia   Brief Hospital Course (including significant findings, care, treatment, and services provided and events leading to death)  Kenneth Maxwell is a 64 y.o. year old male who admitted with acute metabolic encephalopathy and hematemesis.  He had a CT scan that showed an acute to subacute stroke.  His Eliquis was held with the hematemesis.  The patient signed overnight with low blood pressure, drop in hemoglobin and unresponsiveness.  He was also on 100% nonrebreather.  The patient was found to have bilateral large pulmonary emboli.  I was called to see him early this morning and made him comfort care measures.  The patient at that time was unresponsive to sternal rub, his pupils were fixed and nonreactive at 2 mm.  He was on 100% nonrebreather.  I canceled further work-up.  His wife was at the bedside and called family to come visit.  I ordered a morphine drip.  The patient passed away at 8:08 AM.  His last hemoglobin dropped down to 4.4.  MRI of the  brain showed watershed ischemic infarcts.  The patient also had a history of metastatic pancreatic cancer and was followed by hospice as outpatient.    Pertinent Labs and Studies  Significant Diagnostic Studies CT Head Wo Contrast  Result Date: 10/08/19 CLINICAL DATA:  Altered mental status. Additional history provided: Patient reportedly with bloody emesis at home, altered mental status, history of pancreatic cancer. EXAM: CT HEAD WITHOUT CONTRAST TECHNIQUE: Contiguous axial images were obtained from the base of the skull through the vertex without intravenous contrast. COMPARISON:  No pertinent prior exams are available for comparison. FINDINGS: Brain: Mild generalized cerebral atrophy. There is a focus of cortical/subcortical hypodensity within the left parietooccipital lobes measuring 4.1 x 1.5 cm in transaxial dimensions with an appearance most consistent with acute/subacute infarct (series 2, image 16). Background mild ill-defined hypoattenuation within the cerebral white matter is nonspecific, but consistent with chronic small vessel ischemic disease. 1.5 cm focus of hypodensity within the superior right cerebellum which has an appearance most suggestive of a subacute to chronic infarct. There is no acute intracranial hemorrhage. No extra-axial fluid collection. No evidence of intracranial mass. No midline shift. Vascular: No hyperdense vessel.  Atherosclerotic calcifications. Skull: Normal. Negative for fracture or focal lesion. Sinuses/Orbits: Visualized orbits show no acute finding. No significant paranasal sinus disease or mastoid effusion at the imaged levels. These results were called by telephone at the time of interpretation on 2019-10-08 at 3:58 pm to provider [facility] , who verbally acknowledged these results. IMPRESSION: Cortical/subcortical hypodensity  measuring 4.1 x 1.5 cm within the left parietooccipital lobes with an appearance most consistent with acute/early subacute  infarction. 1.5 cm focus of hypodensity within the right superior cerebellar hemisphere, likely reflecting a subacute or chronic infarct. Background mild generalized cerebral atrophy and chronic small vessel ischemic disease. Electronically Signed   By: Kellie Simmering DO   On: 09/17/2019 15:58   CT ANGIO CHEST PE W OR WO CONTRAST  Result Date: 09/18/2019 CLINICAL DATA:  Hemoptysis EXAM: CT ANGIOGRAPHY CHEST WITH CONTRAST TECHNIQUE: Multidetector CT imaging of the chest was performed using the standard protocol during bolus administration of intravenous contrast. Multiplanar CT image reconstructions and MIPs were obtained to evaluate the vascular anatomy. CONTRAST:  44mL OMNIPAQUE IOHEXOL 350 MG/ML SOLN COMPARISON:  None. FINDINGS: Cardiovascular: Contrast injection is sufficient to demonstrate satisfactory opacification of the pulmonary arteries to the segmental level. There are bilateral large lower lobe pulmonary emboli. There are also small emboli within the proximal arteries of the right upper lobe and the lingula. There is no CT evidence of right heart strain. The size of the main pulmonary artery is normal. Normal heart size with coronary artery calcification. The course and caliber of the aorta are normal. There is mild atherosclerotic calcification. Opacification decreased due to pulmonary arterial phase contrast bolus timing. Mediastinum/Nodes: No mediastinal, hilar or axillary lymphadenopathy. Normal visualized thyroid. Thoracic esophageal course is normal. Lungs/Pleura: Biapical emphysema. No focal consolidation. No pulmonary infarct. Upper Abdomen: Contrast bolus timing is not optimized for evaluation of the abdominal organs. Large amount of pneumobilia. Musculoskeletal: No chest wall abnormality. No bony spinal canal stenosis. Review of the MIP images confirms the above findings. IMPRESSION: 1. Bilateral large lower lobe pulmonary emboli. Small emboli within the proximal arteries of the right upper  lobe and lingula. No CT evidence of right heart strain. Aortic Atherosclerosis (ICD10-I70.0) and Emphysema (ICD10-J43.9). Critical Value/emergent results were called by telephone at the time of interpretation on 09/09/2019 at 8:26 pm to provider [facility], who verbally acknowledged these results. Electronically Signed   By: Ulyses Jarred M.D.   On: 09/18/2019 20:27   MR BRAIN WO CONTRAST  Result Date: 09/10/2019 CLINICAL DATA:  Abnormal head CT.  Rapid response during scan. EXAM: MRI HEAD WITHOUT CONTRAST TECHNIQUE: Multiplanar, multiecho pulse sequences of the brain and surrounding structures were obtained without intravenous contrast. COMPARISON:  None. FINDINGS: Examination could not be completed. A rapid response was called during the study. There are multiple bilateral areas of acute ischemia within both cerebral and cerebellar hemispheres, greatest in the left occipital lobe. Multiple vascular territories are affected. Pattern is most suggestive of watershed infarcts. IMPRESSION: Multiple bilateral areas of acute ischemia within both cerebral and cerebellar hemispheres, greatest in the left occipital lobe. Pattern is most suggestive of watershed infarcts. Electronically Signed   By: Ulyses Jarred M.D.   On: 09/05/2019 22:45   US Carotid Bilateral (at [facility] and [facility] only)  Result Date: 10-24-19 CLINICAL DATA:  Stroke. Recent thrombectomy. Hypertension, history of tobacco abuse EXAM: BILATERAL CAROTID DUPLEX ULTRASOUND TECHNIQUE: Pearline Cables scale imaging, color Doppler and duplex ultrasound were performed of bilateral carotid and vertebral arteries in the neck. Technologist describes technically difficult study secondary to uncooperative patient, unable to hold still, requiring 3 different attempts. COMPARISON:  None. FINDINGS: Criteria: Quantification of carotid stenosis is based on velocity parameters that correlate the residual internal carotid diameter with NASCET-based stenosis levels, using the  diameter of the distal internal carotid lumen as the denominator for stenosis measurement. The following velocity  measurements were obtained: RIGHT ICA: 102/39 cm/sec CCA: 57/01 cm/sec SYSTOLIC ICA/CCA RATIO:  1.8 ECA: 67 cm/sec LEFT ICA: 91/38 cm/sec CCA: 77/93 cm/sec SYSTOLIC ICA/CCA RATIO:  1.5 ECA: 103 cm/sec RIGHT CAROTID ARTERY: Eccentric partially calcified plaque in the bulb and proximal ICA resulting in only mild stenosis. Normal waveforms and color Doppler signal. RIGHT VERTEBRAL ARTERY:  Normal flow direction and waveform. LEFT CAROTID ARTERY: Mild smooth plaque in the mid common carotid artery. Eccentric partially calcified plaque in the bulb and proximal ICA resulting in mild stenosis. Normal waveforms and color Doppler signal throughout. LEFT VERTEBRAL ARTERY:  Normal flow direction and waveform. IMPRESSION: 1. Bilateral carotid bifurcation plaque resulting in less than 50% diameter ICA stenosis. 2. Antegrade bilateral vertebral arterial flow. Electronically Signed   By: Lucrezia Europe M.D.   On: 10-06-19 11:38   PERIPHERAL VASCULAR CATHETERIZATION  Result Date: 09/20/2019 See Op Note  US Venous Img Lower Unilateral Right  Result Date: 09/18/2019 CLINICAL DATA:  64 year old male with right lower extremity pain and swelling EXAM: RIGHT LOWER EXTREMITY VENOUS DOPPLER ULTRASOUND TECHNIQUE: Gray-scale sonography with graded compression, as well as color Doppler and duplex ultrasound were performed to evaluate the lower extremity deep venous systems from the level of the common femoral vein and including the common femoral, femoral, profunda femoral, popliteal and calf veins including the posterior tibial, peroneal and gastrocnemius veins when visible. The superficial great saphenous vein was also interrogated. Spectral Doppler was utilized to evaluate flow at rest and with distal augmentation maneuvers in the common femoral, femoral and popliteal veins. COMPARISON:  None. FINDINGS: Contralateral  Common Femoral Vein: Respiratory phasicity is normal and symmetric with the symptomatic side. No evidence of thrombus. Normal compressibility. Common Femoral Vein: Occlusive thrombus of the common femoral vein extending above the limits of the ultrasound study into the pelvis. Saphenofemoral Junction: No evidence of thrombus. Normal compressibility and flow on color Doppler imaging. Profunda Femoral Vein: Occlusive thrombus of the profunda vein which is noncompressible. Femoral Vein: Occlusive thrombus through the femoral vein into the popliteal vein. Popliteal Vein: Occlusive thrombus of the popliteal vein into the tibial veins. Calf Veins: Occlusive thrombus of the posterior tibial vein and peroneal veins Superficial Great Saphenous Vein: No evidence of thrombus. Normal compressibility and flow on color Doppler imaging. Other Findings:  None. IMPRESSION: Study positive for occlusive DVT of right common femoral vein, profunda vein, femoral vein, popliteal vein, extending into the tibial veins. Thrombus extends above the limits of the ultrasound study into the pelvis. If there is concern to evaluate IVC or the iliac veins, contrast-enhanced CT may be considered. Electronically Signed   By: Corrie Mckusick D.O.   On: 09/18/2019 16:12    Microbiology Recent Results (from the past 240 hour(s))  Respiratory Panel by RT PCR (Flu A&B, Covid) - Nasopharyngeal Swab     Status: None   Collection Time: 09/10/2019  5:08 PM   Specimen: Nasopharyngeal Swab  Result Value Ref Range Status   SARS Coronavirus 2 by RT PCR NEGATIVE NEGATIVE Final    Comment: (NOTE) SARS-CoV-2 target nucleic acids are NOT DETECTED.  The SARS-CoV-2 RNA is generally detectable in upper respiratoy specimens during the acute phase of infection. The lowest concentration of SARS-CoV-2 viral copies this assay can detect is 131 copies/mL. A negative result does not preclude SARS-Cov-2 infection and should not be used as the sole basis for  treatment or other patient management decisions. A negative result may occur with  improper specimen collection/handling, submission of specimen  other than nasopharyngeal swab, presence of viral mutation(s) within the areas targeted by this assay, and inadequate number of viral copies (<131 copies/mL). A negative result must be combined with clinical observations, patient history, and epidemiological information. The expected result is Negative.  Fact Sheet for Patients:  PinkCheek.be  Fact Sheet for Healthcare Providers:  GravelBags.it  This test is no t yet approved or cleared by the [place] FDA and  has been authorized for detection and/or diagnosis of SARS-CoV-2 by FDA under an Emergency Use Authorization (EUA). This EUA will remain  in effect (meaning this test can be used) for the duration of the COVID-19 declaration under Section 564(b)(1) of the Act, 21 U.S.C. section 360bbb-3(b)(1), unless the authorization is terminated or revoked sooner.     Influenza A by PCR NEGATIVE NEGATIVE Final   Influenza B by PCR NEGATIVE NEGATIVE Final    Comment: (NOTE) The Xpert Xpress SARS-CoV-2/FLU/RSV assay is intended as an aid in  the diagnosis of influenza from Nasopharyngeal swab specimens and  should not be used as a sole basis for treatment. Nasal washings and  aspirates are unacceptable for Xpert Xpress SARS-CoV-2/FLU/RSV  testing.  Fact Sheet for Patients: PinkCheek.be  Fact Sheet for Healthcare Providers: GravelBags.it  This test is not yet approved or cleared by the [place] FDA and  has been authorized for detection and/or diagnosis of SARS-CoV-2 by  FDA under an Emergency Use Authorization (EUA). This EUA will remain  in effect (meaning this test can be used) for the duration of the  Covid-19 declaration under Section 564(b)(1) of the Act, 21   U.S.C. section 360bbb-3(b)(1), unless the authorization is  terminated or revoked. Performed at [facility], [address] [place],  [address]     Lab Basic Metabolic Panel: Recent Labs  Lab 09/30/2019 1507 October 26, 2019 0254  NA 133*  --   K 3.3*  --   CL 98  --   CO2 16*  --   GLUCOSE 215*  --   BUN 15  --   CREATININE 1.28*  --   CALCIUM 8.6*  --   MG  --  1.7   Liver Function Tests: Recent Labs  Lab 10/03/2019 1507  AST 36  ALT 20  ALKPHOS 199*  BILITOT 0.8  PROT 6.2*  ALBUMIN 2.6*   Recent Labs  Lab 09/09/2019 1507  LIPASE 16   Recent Labs  Lab 09/30/2019 1538  AMMONIA 51*   CBC: Recent Labs  Lab 09/24/2019 1507 09/09/2019 1927 October 26, 2019 0254 10/26/2019 0647  WBC 20.1* 23.6* 21.2* 29.5*  NEUTROABS 15.3*  --   --   --   HGB 9.5* 8.4* 6.5* 4.4*  HCT 28.5* 25.2* 18.6* 12.7*  MCV 82.8 82.1 80.2 81.9  PLT 187 140* 121* 99*  Sepsis Labs: Recent Labs  Lab 09/11/2019 1507 09/12/2019 1927 Oct 26, 2019 0254 10-26-2019 0647  WBC 20.1* 23.6* 21.2* 29.5*     Deyci Gesell 2019/10/26, 1:38 PM

## 2019-10-04 NOTE — ED Notes (Signed)
ED TO INPATIENT HANDOFF REPORT  ED Nurse Name and Phone #: Valetta Fuller [phone]  S Name/Age/Gender Kenneth Maxwell 64 y.o. male Room/Bed: ED37A/ED37A  Code Status   Code Status: DNR  Home/SNF/Other Home Patient oriented to: self and time Is this baseline? No   Triage Complete: Triage complete  Chief Complaint Stroke [facility]) [I63.9]  Triage Note Pt reportedly with bloody emesis at home; altered mental status. Per EMS pt was combative en route and confused. On arrival to ED he is less combative, alert and oriented to person, place, situation    Allergies Allergies  Allergen Reactions  . Codeine Rash and Other (See Comments)    Level of Care/Admitting Diagnosis ED Disposition    ED Disposition Condition [facility]: [place] [[id number]]  Level of Care: Med-Surg [16]  Covid Evaluation: Asymptomatic Screening Protocol (No Symptoms)  Diagnosis: Stroke [facility]) [[id number]]  Admitting Physician: Ivor Costa [[id number]]  Attending Physician: Ivor Costa [phone]  Estimated length of stay: past midnight tomorrow  Certification:: I certify this patient will need inpatient services for at least 2 midnights       B Medical/Surgery History Past Medical History:  Diagnosis Date  . Alcohol abuse   . Arthritis   . Blood clotting disorder ([place])   . Chronic renal disease   . Encounter for antineoplastic chemotherapy 05/24/2019  . H/O urinary retention   . Hematuria, gross   . Hypertension   . Pancreatic cancer metastasized to liver ([place]) 04/2019  . Renal cell carcinoma ([place]) 11/2013   kidney cancer s/p R nephrectomy   Past Surgical History:  Procedure Laterality Date  . ERCP N/A 04/28/2019   Procedure: ENDOSCOPIC RETROGRADE CHOLANGIOPANCREATOGRAPHY (ERCP);  Surgeon: Lucilla Lame, MD;  Location: [facility] ENDOSCOPY;  Service: Endoscopy;  Laterality: N/A;  . ERCP N/A 06/13/2019   Procedure: ENDOSCOPIC RETROGRADE CHOLANGIOPANCREATOGRAPHY (ERCP);  Surgeon:  Lucilla Lame, MD;  Location: [facility] ENDOSCOPY;  Service: Endoscopy;  Laterality: N/A;  . kidney removed Right   . liver stent Right   . NEPHRECTOMY RADICAL Right 11/2013   R side kidney cancer surgery, non adrenal sparing  . PERIPHERAL VASCULAR THROMBECTOMY Right 09/20/2019   Procedure: PERIPHERAL VASCULAR THROMBECTOMY;  Surgeon: Katha Cabal, MD;  Location: [place] CV LAB;  Service: Cardiovascular;  Laterality: Right;  . PORTA CATH INSERTION N/A 05/20/2019   Procedure: PORTA CATH INSERTION;  Surgeon: Algernon Huxley, MD;  Location: [place] CV LAB;  Service: Cardiovascular;  Laterality: N/A;     A IV Location/Drains/Wounds Patient Lines/Drains/Airways Status    Active Line/Drains/Airways    Name Placement date Placement time Site Days   Peripheral IV 09/14/2019 Anterior;Right Forearm 09/23/2019  1439  Forearm  1   Peripheral IV 09/17/2019 Left Arm 09/29/2019  1501  Arm  1          Intake/Output Last 24 hours  Intake/Output Summary (Last 24 hours) at 10/27/2019 0048 Last data filed at 09/19/2019 2030 Gross per 24 hour  Intake 2000 ml  Output --  Net 2000 ml    Labs/Imaging Results for orders placed or performed during the hospital encounter of 09/12/2019 (from the past 48 hour(s))  CBC with Differential     Status: Abnormal   Collection Time: 09/26/2019  3:07 PM  Result Value Ref Range   WBC 20.1 (H) 4.0 - 10.5 K/uL   RBC 3.44 (L) 4.22 - 5.81 MIL/uL   Hemoglobin 9.5 (L) 13.0 - 17.0 g/dL   HCT 28.5 (L)  39 - 52 %   MCV 82.8 80.0 - 100.0 fL   MCH 27.6 26.0 - 34.0 pg   MCHC 33.3 30.0 - 36.0 g/dL   RDW 16.1 (H) 11.5 - 15.5 %   Platelets 187 150 - 400 K/uL   nRBC 0.0 0.0 - 0.2 %   Neutrophils Relative % 76 %   Neutro Abs 15.3 (H) 1.7 - 7.7 K/uL   Lymphocytes Relative 12 %   Lymphs Abs 2.3 0.7 - 4.0 K/uL   Monocytes Relative 8 %   Monocytes Absolute 1.5 (H) 0 - 1 K/uL   Eosinophils Relative 1 %   Eosinophils Absolute 0.2 0 - 0 K/uL   Basophils Relative 0 %   Basophils  Absolute 0.1 0 - 0 K/uL   Immature Granulocytes 3 %   Abs Immature Granulocytes 0.63 (H) 0.00 - 0.07 K/uL    Comment: Performed at [facility], [address] [place], [place] [address]  Comprehensive metabolic panel     Status: Abnormal   Collection Time: 09/08/2019  3:07 PM  Result Value Ref Range   Sodium 133 (L) 135 - 145 mmol/L   Potassium 3.3 (L) 3.5 - 5.1 mmol/L   Chloride 98 98 - 111 mmol/L   CO2 16 (L) 22 - 32 mmol/L   Glucose, Bld 215 (H) 70 - 99 mg/dL    Comment: Glucose reference range applies only to samples taken after fasting for at least 8 hours.   BUN 15 8 - 23 mg/dL   Creatinine, Ser 1.28 (H) 0.61 - 1.24 mg/dL   Calcium 8.6 (L) 8.9 - 10.3 mg/dL   Total Protein 6.2 (L) 6.5 - 8.1 g/dL   Albumin 2.6 (L) 3.5 - 5.0 g/dL   AST 36 15 - 41 U/L   ALT 20 0 - 44 U/L   Alkaline Phosphatase 199 (H) 38 - 126 U/L   Total Bilirubin 0.8 0.3 - 1.2 mg/dL   GFR calc non Af Amer >60 >60 mL/min   GFR calc Af Amer >60 >60 mL/min   Anion gap 19 (H) 5 - 15    Comment: Performed at [facility], [address] [place], [place] [address]  Lipase, blood     Status: None   Collection Time: 09/29/2019  3:07 PM  Result Value Ref Range   Lipase 16 11 - 51 U/L    Comment: Performed at [facility], [address] [place], [place] [address]  Type and screen [place]     Status: None (Preliminary result)   Collection Time: 09/19/2019  3:07 PM  Result Value Ref Range   ABO/RH(D) PENDING    Antibody Screen PENDING    Sample Expiration      10/04/2019,2359 Performed at [facility], [address] [place], [place] [address]   Ammonia     Status: Abnormal   Collection Time: 09/26/2019  3:38 PM  Result Value Ref Range   Ammonia 51 (H) 9 - 35 umol/L    Comment: Performed at [facility], [address] [place], [place] [address]  Type and screen Ordered by PROVIDER DEFAULT     Status: None (Preliminary result)   Collection  Time: 10/01/19  3:38 PM  Result Value Ref Range   ABO/RH(D) PENDING    Antibody Screen PENDING    Sample Expiration      10/04/2019,2359 Performed at [facility], [address] [place], [place] [address]   Type and screen  Status: None   Collection Time: 09/11/2019  4:36 PM  Result Value Ref Range   ABO/RH(D) A POS    Antibody Screen NEG    Sample Expiration      10/04/2019,2359 Performed at [facility], [address] [place], [place] [address]   Respiratory Panel by RT PCR (Flu A&B, Covid) - Nasopharyngeal Swab     Status: None   Collection Time: 09/24/2019  5:08 PM   Specimen: Nasopharyngeal Swab  Result Value Ref Range   SARS Coronavirus 2 by RT PCR NEGATIVE NEGATIVE    Comment: (NOTE) SARS-CoV-2 target nucleic acids are NOT DETECTED.  The SARS-CoV-2 RNA is generally detectable in upper respiratoy specimens during the acute phase of infection. The lowest concentration of SARS-CoV-2 viral copies this assay can detect is 131 copies/mL. A negative result does not preclude SARS-Cov-2 infection and should not be used as the sole basis for treatment or other patient management decisions. A negative result may occur with  improper specimen collection/handling, submission of specimen other than nasopharyngeal swab, presence of viral mutation(s) within the areas targeted by this assay, and inadequate number of viral copies (<131 copies/mL). A negative result must be combined with clinical observations, patient history, and epidemiological information. The expected result is Negative.  Fact Sheet for Patients:  PinkCheek.be  Fact Sheet for Healthcare Providers:  GravelBags.it  This test is no t yet approved or cleared by the [place] FDA and  has been authorized for detection and/or diagnosis of SARS-CoV-2 by FDA under an Emergency Use Authorization (EUA). This EUA will remain  in effect  (meaning this test can be used) for the duration of the COVID-19 declaration under Section 564(b)(1) of the Act, 21 U.S.C. section 360bbb-3(b)(1), unless the authorization is terminated or revoked sooner.     Influenza A by PCR NEGATIVE NEGATIVE   Influenza B by PCR NEGATIVE NEGATIVE    Comment: (NOTE) The Xpert Xpress SARS-CoV-2/FLU/RSV assay is intended as an aid in  the diagnosis of influenza from Nasopharyngeal swab specimens and  should not be used as a sole basis for treatment. Nasal washings and  aspirates are unacceptable for Xpert Xpress SARS-CoV-2/FLU/RSV  testing.  Fact Sheet for Patients: PinkCheek.be  Fact Sheet for Healthcare Providers: GravelBags.it  This test is not yet approved or cleared by the [place] FDA and  has been authorized for detection and/or diagnosis of SARS-CoV-2 by  FDA under an Emergency Use Authorization (EUA). This EUA will remain  in effect (meaning this test can be used) for the duration of the  Covid-19 declaration under Section 564(b)(1) of the Act, 21  U.S.C. section 360bbb-3(b)(1), unless the authorization is  terminated or revoked. Performed at [facility], [address] [place], [place] [address]   APTT     Status: None   Collection Time: 09/30/2019  7:27 PM  Result Value Ref Range   aPTT 32 24 - 36 seconds    Comment: Performed at [facility], [place], [place] [address]  Troponin I (High Sensitivity)     Status: Abnormal   Collection Time: 09/22/2019  7:27 PM  Result Value Ref Range   Troponin I (High Sensitivity) 412 (HH) <18 ng/L    Comment: CRITICAL RESULT CALLED TO, READ BACK BY AND VERIFIED WITH CHRISTINE HALLAS @2112  ON 09/29/2019 SKL (NOTE) Elevated high sensitivity troponin I (hsTnI) values and significant  changes across serial measurements may suggest ACS but many other  chronic and acute conditions are  known to elevate hsTnI  results.  Refer to the "Links" section for chest pain algorithms and additional  guidance. Performed at [facility], [address] [place], [place] [address]   CBC     Status: Abnormal   Collection Time: 09/25/2019  7:27 PM  Result Value Ref Range   WBC 23.6 (H) 4.0 - 10.5 K/uL   RBC 3.07 (L) 4.22 - 5.81 MIL/uL   Hemoglobin 8.4 (L) 13.0 - 17.0 g/dL   HCT 25.2 (L) 39 - 52 %   MCV 82.1 80.0 - 100.0 fL   MCH 27.4 26.0 - 34.0 pg   MCHC 33.3 30.0 - 36.0 g/dL   RDW 15.9 (H) 11.5 - 15.5 %   Platelets 140 (L) 150 - 400 K/uL   nRBC 0.0 0.0 - 0.2 %    Comment: Performed at [facility], [address] [place], [place] [address]  Protime-INR     Status: Abnormal   Collection Time: 09/04/2019  7:27 PM  Result Value Ref Range   Prothrombin Time 19.1 (H) 11.4 - 15.2 seconds   INR 1.7 (H) 0.8 - 1.2    Comment: (NOTE) INR goal varies based on device and disease states. Performed at [facility], [address] [place], [place] [address]   Troponin I (High Sensitivity)     Status: Abnormal   Collection Time: 09/13/2019  9:23 PM  Result Value Ref Range   Troponin I (High Sensitivity) 454 (HH) <18 ng/L    Comment: CRITICAL VALUE NOTED. VALUE IS CONSISTENT WITH PREVIOUSLY REPORTED/CALLED VALUE SKL (NOTE) Elevated high sensitivity troponin I (hsTnI) values and significant  changes across serial measurements may suggest ACS but many other  chronic and acute conditions are known to elevate hsTnI results.  Refer to the "Links" section for chest pain algorithms and additional  guidance. Performed at [facility], [address], [place], [place] [address]    CT Head Wo Contrast  Result Date: 09/15/2019 CLINICAL DATA:  Altered mental status. Additional history provided: Patient reportedly with bloody emesis at home, altered mental status, history of pancreatic cancer. EXAM: CT HEAD WITHOUT CONTRAST TECHNIQUE: Contiguous axial images were obtained from the  base of the skull through the vertex without intravenous contrast. COMPARISON:  No pertinent prior exams are available for comparison. FINDINGS: Brain: Mild generalized cerebral atrophy. There is a focus of cortical/subcortical hypodensity within the left parietooccipital lobes measuring 4.1 x 1.5 cm in transaxial dimensions with an appearance most consistent with acute/subacute infarct (series 2, image 16). Background mild ill-defined hypoattenuation within the cerebral white matter is nonspecific, but consistent with chronic small vessel ischemic disease. 1.5 cm focus of hypodensity within the superior right cerebellum which has an appearance most suggestive of a subacute to chronic infarct. There is no acute intracranial hemorrhage. No extra-axial fluid collection. No evidence of intracranial mass. No midline shift. Vascular: No hyperdense vessel.  Atherosclerotic calcifications. Skull: Normal. Negative for fracture or focal lesion. Sinuses/Orbits: Visualized orbits show no acute finding. No significant paranasal sinus disease or mastoid effusion at the imaged levels. These results were called by telephone at the time of interpretation on 09/24/2019 at 3:58 pm to provider [facility] , who verbally acknowledged these results. IMPRESSION: Cortical/subcortical hypodensity measuring 4.1 x 1.5 cm within the left parietooccipital lobes with an appearance most consistent with acute/early subacute infarction. 1.5 cm focus of hypodensity within the right superior cerebellar hemisphere, likely reflecting a subacute or chronic infarct. Background mild generalized cerebral atrophy and  chronic small vessel ischemic disease. Electronically Signed   By: Kellie Simmering DO   On: 09/07/2019 15:58    Pending Labs Unresulted Labs (From admission, onward)          Start     Ordered   October 17, 2019 0500  Hemoglobin A1c  Tomorrow morning,   STAT        09/19/2019 1757   17-Oct-2019 0500  Lipid panel  Tomorrow morning,   STAT        Comments: Fasting    09/21/2019 1757   17-Oct-2019 0500  Magnesium  Tomorrow morning,   STAT        09/26/2019 1926   09/28/2019 1918  CBC  Now then every 6 hours,   STAT      09/04/2019 1918          Vitals/Pain Today's Vitals   09/20/2019 2030 09/13/2019 2100 10/03/2019 2230 2019-10-17 0024  BP:   133/72   Pulse: (!) 145 (!) 119    Resp: (!) 39 (!) 45 (!) 28   Temp:      TempSrc:      SpO2: 100% 100% 100%   Weight:      Height:      PainSc:    0-No pain    Isolation Precautions No active isolations  Medications Medications  0.9 %  sodium chloride infusion ( Intravenous New Bag/Given 09/10/2019 2234)  pantoprazole (PROTONIX) injection 40 mg (40 mg Intravenous Given 09/14/2019 2300)  traZODone (DESYREL) tablet 50 mg (0 mg Oral Hold 09/19/2019 2328)  docusate sodium (COLACE) capsule 100 mg (has no administration in time range)  multivitamin with minerals tablet 1 tablet (1 tablet Oral Not Given 09/04/2019 1859)  morphine (MS CONTIN) 12 hr tablet 30 mg (0 mg Oral Hold 09/19/2019 2328)  oxyCODONE (Oxy IR/ROXICODONE) immediate release tablet 10 mg (10 mg Oral Given 09/25/2019 2059)  ondansetron (ZOFRAN) injection 4 mg (4 mg Intravenous Given 09/30/2019 2312)  nicotine (NICODERM CQ - dosed in mg/24 hours) patch 21 mg (21 mg Transdermal Patch Applied 09/17/2019 1808)   stroke: mapping our early stages of recovery book (has no administration in time range)  senna-docusate (Senokot-S) tablet 1 tablet (has no administration in time range)  atorvastatin (LIPITOR) tablet 40 mg (40 mg Oral Not Given 10/03/2019 1859)  haloperidol (HALDOL) tablet 1 mg (0 mg Oral Hold 09/11/2019 2329)  LORazepam (ATIVAN) tablet 0.5 mg (0 mg Oral Hold 09/04/2019 2329)  hydrALAZINE (APRESOLINE) injection 5 mg (has no administration in time range)  potassium chloride SA (KLOR-CON) CR tablet 40 mEq (0 mEq Oral Hold 09/16/2019 2329)  lactulose (CHRONULAC) 10 GM/15ML solution 20 g (0 g Oral Hold 09/12/2019 2328)  morphine 2 MG/ML injection 2 mg (2 mg  Intravenous Given 09/24/2019 2353)  LORazepam (ATIVAN) injection 1 mg (has no administration in time range)  haloperidol lactate (HALDOL) injection 5 mg (5 mg Intravenous Given 09/07/2019 1523)  sodium chloride 0.9 % bolus 1,000 mL (0 mLs Intravenous Stopped 09/26/2019 1746)  sodium chloride 0.9 % bolus 1,000 mL (0 mLs Intravenous Stopped 09/12/2019 2030)  iohexol (OMNIPAQUE) 350 MG/ML injection 75 mL (75 mLs Intravenous Contrast Given 09/07/2019 1946)  LORazepam (ATIVAN) injection 1 mg (1 mg Intravenous Given 10/03/2019 2150)    Mobility   Focused Assessments Cardiac Assessment Handoff:    Lab Results  Component Value Date   TROPONINI < 0.02 10/18/2013   No results found for: DDIMER Does the Patient currently have chest pain? No     R  Recommendations: See Admitting Provider Note  Report given to:   Additional Notes:

## 2019-10-04 NOTE — Progress Notes (Signed)
   2019-11-01 0610  Assess: MEWS Score  Temp 98 F (36.7 C)  BP (!) 78/60  Pulse Rate (!) 104  Resp 19  SpO2 97 %  O2 Device Nasal Cannula  Assess: MEWS Score  MEWS Temp 0  MEWS Systolic 2  MEWS Pulse 1  MEWS RR 0  MEWS LOC 0  MEWS Score 3  MEWS Score Color Yellow  Assess: if the MEWS score is Yellow or Red  Were vital signs taken at a resting state? Yes  Focused Assessment No change from prior assessment  Early Detection of Sepsis Score *See Row Information* Medium  MEWS guidelines implemented *See Row Information* Yes  Take Vital Signs  Increase Vital Sign Frequency  Yellow: Q 2hr X 2 then Q 4hr X 2, if remains yellow, continue Q 4hrs  Escalate  MEWS: Escalate Yellow: discuss with charge nurse/RN and consider discussing with provider and RRT (discussed with NP)  Notify: Provider  Provider Name/Title Rufina Falco, NP  Date Provider Notified 11-01-19  Time Provider Notified [phone]  Notification Type Call  Notification Reason Change in status  Response See new orders  Date of Provider Response 2019/11/01  Time of Provider Response 0615  Notify: Rapid Response  Name of Rapid Response RN Notified Janeal Holmes, [facility]  Date Rapid Response Notified 11/01/19  Time Rapid Response Notified 0610

## 2019-10-04 NOTE — Progress Notes (Signed)
   11/01/2019 0815  Clinical Encounter Type  Visited With Family  Visit Type Initial;Death  Referral From Nurse  Consult/Referral To Chaplain  Chaplain responded to pg. When chaplain called, secretary said pt died. Chaplain arrived, finding wife and daughter in the room. Pt's wife explained she was going to leave last night and Pt told her he thought it was going to be a rough one, so she stayed with him. Pt's wife was at his bedside when he died. The daughter became overwhelmed with grief and chaplain hugged her for a while. Shortly thereafter the nurse told Pt's wife that several people were on their way up and he wanted to know if they were aware of Pt's passing and she said no. Her son, daughter, and sister-in-law were coming up. Chaplain walked Pt's wife to the hall where the little sitting area is. Wife waited there for her daughter and chaplain went back to the room. Earlier Pt's daughter said she didn't know how she would tell her daughter and chaplain talked to her about that. After talking to the daughter chaplain walked back to check on Pt's wife and hearing loud crying, it was Pt's other daughter. She was blaming herself for her father's death and everyone around her told her that it was not her fault. We walked her to the room and she repeated that it was her fault and that she should have done something. Her mother told her that she did everything right. Pt's son appeared to be upset because he didn't know all that was going on with his father. The son is recently getting over [place]. Pt's wife and daughter step out, wife said she needed to make some calls. After Pt's wife left, Pt's sister went to bedside, crying and rubbing her brother's forehead. When wife left chaplain left and went to nurse's station and told nurse to page her if chaplain was needed anymore.

## 2019-10-04 NOTE — Progress Notes (Signed)
This RN was notified by family member at bedside that patient was not breathing anymore. Upon assessment of patient, patient had expired, time of death 0808, primary RN notified and came to bedside as well as attending MD. Chaplain paged for family and emotional support provided.

## 2019-10-04 NOTE — Progress Notes (Signed)
Patient ID: Kenneth Maxwell, male   DOB: 02-01-1955, 64 y.o.   MRN: [id number] [facility] PROGRESS NOTE  Kenneth Maxwell BDZ:[id number] DOB: 04-Feb-1955 DOA: 09/14/2019 PCP: Olin Hauser, DO  HPI/Subjective: Called this morning with hypotension and unresponsiveness.  When I got to the bedside the patient was using accessory muscles to breathe.  He was unresponsive to sternal rub.  Still hypotension.  Wife at the bedside and we discussed comfort care measures.  She will call and other family members.  Objective: Vitals:   10/18/2019 0638 10-18-2019 0709  BP: (!) 81/64 (!) 65/44  Pulse: (!) 106 (!) 105  Resp:    Temp:    SpO2:  100%    Intake/Output Summary (Last 24 hours) at 10/18/19 0728 Last data filed at 09/08/2019 2030 Gross per 24 hour  Intake 2000 ml  Output --  Net 2000 ml   Filed Weights   09/13/2019 1504  Weight: 68 kg    ROS: Review of Systems  Unable to perform ROS: Acuity of condition   Exam: Physical Exam HENT:     Head: Normocephalic.     Nose: No mucosal edema.     Mouth/Throat:     Comments: Unable to look into mouth Eyes:     General: Lids are normal.     Comments: Pupils fixed 2 mm  Cardiovascular:     Rate and Rhythm: Regular rhythm. Tachycardia present.     Heart sounds: Normal heart sounds, S1 normal and S2 normal.  Pulmonary:     Effort: Accessory muscle usage present.     Breath sounds: Examination of the right-lower field reveals decreased breath sounds. Examination of the left-lower field reveals decreased breath sounds. Decreased breath sounds present. No wheezing, rhonchi or rales.  Abdominal:     Palpations: Abdomen is soft.     Tenderness: There is no abdominal tenderness.  Musculoskeletal:     Right ankle: No swelling.     Left ankle: No swelling.  Skin:    General: Skin is moist.     Findings: No rash.  Neurological:     Mental Status: He is unresponsive.     Comments: Pupils fixed 2 mm Babinski  negative Unresponsive to sternal rub Corneal reflex absent left, slight on the right.       Data Reviewed: Basic Metabolic Panel: Recent Labs  Lab 09/05/2019 1507 October 18, 2019 0254  NA 133*  --   K 3.3*  --   CL 98  --   CO2 16*  --   GLUCOSE 215*  --   BUN 15  --   CREATININE 1.28*  --   CALCIUM 8.6*  --   MG  --  1.7   Liver Function Tests: Recent Labs  Lab 09/04/2019 1507  AST 36  ALT 20  ALKPHOS 199*  BILITOT 0.8  PROT 6.2*  ALBUMIN 2.6*   Recent Labs  Lab 09/29/2019 1507  LIPASE 16   Recent Labs  Lab 09/04/2019 1538  AMMONIA 51*   CBC: Recent Labs  Lab 09/29/2019 1507 09/21/2019 1927 October 18, 2019 0254  WBC 20.1* 23.6* 21.2*  NEUTROABS 15.3*  --   --   HGB 9.5* 8.4* 6.5*  HCT 28.5* 25.2* 18.6*  MCV 82.8 82.1 80.2  PLT 187 140* 121*   Cardiac Enzymes: No results for input(s): CKTOTAL, CKMB, CKMBINDEX, TROPONINI in the last 168 hours. BNP (last 3 results) No results for input(s): BNP in the last 8760 hours.  ProBNP (last 3 results) No  results for input(s): PROBNP in the last 8760 hours.  CBG: No results for input(s): GLUCAP in the last 168 hours.  Recent Results (from the past 240 hour(s))  Respiratory Panel by RT PCR (Flu A&B, Covid) - Nasopharyngeal Swab     Status: None   Collection Time: 09/12/2019  5:08 PM   Specimen: Nasopharyngeal Swab  Result Value Ref Range Status   SARS Coronavirus 2 by RT PCR NEGATIVE NEGATIVE Final    Comment: (NOTE) SARS-CoV-2 target nucleic acids are NOT DETECTED.  The SARS-CoV-2 RNA is generally detectable in upper respiratoy specimens during the acute phase of infection. The lowest concentration of SARS-CoV-2 viral copies this assay can detect is 131 copies/mL. A negative result does not preclude SARS-Cov-2 infection and should not be used as the sole basis for treatment or other patient management decisions. A negative result may occur with  improper specimen collection/handling, submission of specimen other than  nasopharyngeal swab, presence of viral mutation(s) within the areas targeted by this assay, and inadequate number of viral copies (<131 copies/mL). A negative result must be combined with clinical observations, patient history, and epidemiological information. The expected result is Negative.  Fact Sheet for Patients:  PinkCheek.be  Fact Sheet for Healthcare Providers:  GravelBags.it  This test is no t yet approved or cleared by the [place] FDA and  has been authorized for detection and/or diagnosis of SARS-CoV-2 by FDA under an Emergency Use Authorization (EUA). This EUA will remain  in effect (meaning this test can be used) for the duration of the COVID-19 declaration under Section 564(b)(1) of the Act, 21 U.S.C. section 360bbb-3(b)(1), unless the authorization is terminated or revoked sooner.     Influenza A by PCR NEGATIVE NEGATIVE Final   Influenza B by PCR NEGATIVE NEGATIVE Final    Comment: (NOTE) The Xpert Xpress SARS-CoV-2/FLU/RSV assay is intended as an aid in  the diagnosis of influenza from Nasopharyngeal swab specimens and  should not be used as a sole basis for treatment. Nasal washings and  aspirates are unacceptable for Xpert Xpress SARS-CoV-2/FLU/RSV  testing.  Fact Sheet for Patients: PinkCheek.be  Fact Sheet for Healthcare Providers: GravelBags.it  This test is not yet approved or cleared by the [place] FDA and  has been authorized for detection and/or diagnosis of SARS-CoV-2 by  FDA under an Emergency Use Authorization (EUA). This EUA will remain  in effect (meaning this test can be used) for the duration of the  Covid-19 declaration under Section 564(b)(1) of the Act, 21  U.S.C. section 360bbb-3(b)(1), unless the authorization is  terminated or revoked. Performed at [facility], [address] [place], [place]  [address]      Studies: CT Head Wo Contrast  Result Date: 09/30/2019 CLINICAL DATA:  Altered mental status. Additional history provided: Patient reportedly with bloody emesis at home, altered mental status, history of pancreatic cancer. EXAM: CT HEAD WITHOUT CONTRAST TECHNIQUE: Contiguous axial images were obtained from the base of the skull through the vertex without intravenous contrast. COMPARISON:  No pertinent prior exams are available for comparison. FINDINGS: Brain: Mild generalized cerebral atrophy. There is a focus of cortical/subcortical hypodensity within the left parietooccipital lobes measuring 4.1 x 1.5 cm in transaxial dimensions with an appearance most consistent with acute/subacute infarct (series 2, image 16). Background mild ill-defined hypoattenuation within the cerebral white matter is nonspecific, but consistent with chronic small vessel ischemic disease. 1.5 cm focus of hypodensity within the superior right cerebellum which has an appearance most suggestive  of a subacute to chronic infarct. There is no acute intracranial hemorrhage. No extra-axial fluid collection. No evidence of intracranial mass. No midline shift. Vascular: No hyperdense vessel.  Atherosclerotic calcifications. Skull: Normal. Negative for fracture or focal lesion. Sinuses/Orbits: Visualized orbits show no acute finding. No significant paranasal sinus disease or mastoid effusion at the imaged levels. These results were called by telephone at the time of interpretation on 09/27/2019 at 3:58 pm to provider [facility] , who verbally acknowledged these results. IMPRESSION: Cortical/subcortical hypodensity measuring 4.1 x 1.5 cm within the left parietooccipital lobes with an appearance most consistent with acute/early subacute infarction. 1.5 cm focus of hypodensity within the right superior cerebellar hemisphere, likely reflecting a subacute or chronic infarct. Background mild generalized cerebral atrophy and chronic small  vessel ischemic disease. Electronically Signed   By: Kellie Simmering DO   On: 09/08/2019 15:58   CT ANGIO CHEST PE W OR WO CONTRAST  Result Date: 09/05/2019 CLINICAL DATA:  Hemoptysis EXAM: CT ANGIOGRAPHY CHEST WITH CONTRAST TECHNIQUE: Multidetector CT imaging of the chest was performed using the standard protocol during bolus administration of intravenous contrast. Multiplanar CT image reconstructions and MIPs were obtained to evaluate the vascular anatomy. CONTRAST:  60mL OMNIPAQUE IOHEXOL 350 MG/ML SOLN COMPARISON:  None. FINDINGS: Cardiovascular: Contrast injection is sufficient to demonstrate satisfactory opacification of the pulmonary arteries to the segmental level. There are bilateral large lower lobe pulmonary emboli. There are also small emboli within the proximal arteries of the right upper lobe and the lingula. There is no CT evidence of right heart strain. The size of the main pulmonary artery is normal. Normal heart size with coronary artery calcification. The course and caliber of the aorta are normal. There is mild atherosclerotic calcification. Opacification decreased due to pulmonary arterial phase contrast bolus timing. Mediastinum/Nodes: No mediastinal, hilar or axillary lymphadenopathy. Normal visualized thyroid. Thoracic esophageal course is normal. Lungs/Pleura: Biapical emphysema. No focal consolidation. No pulmonary infarct. Upper Abdomen: Contrast bolus timing is not optimized for evaluation of the abdominal organs. Large amount of pneumobilia. Musculoskeletal: No chest wall abnormality. No bony spinal canal stenosis. Review of the MIP images confirms the above findings. IMPRESSION: 1. Bilateral large lower lobe pulmonary emboli. Small emboli within the proximal arteries of the right upper lobe and lingula. No CT evidence of right heart strain. Aortic Atherosclerosis (ICD10-I70.0) and Emphysema (ICD10-J43.9). Critical Value/emergent results were called by telephone at the time of  interpretation on 09/16/2019 at 8:26 pm to provider [facility], who verbally acknowledged these results. Electronically Signed   By: Ulyses Jarred M.D.   On: 09/05/2019 20:27   MR BRAIN WO CONTRAST  Result Date: 09/06/2019 CLINICAL DATA:  Abnormal head CT.  Rapid response during scan. EXAM: MRI HEAD WITHOUT CONTRAST TECHNIQUE: Multiplanar, multiecho pulse sequences of the brain and surrounding structures were obtained without intravenous contrast. COMPARISON:  None. FINDINGS: Examination could not be completed. A rapid response was called during the study. There are multiple bilateral areas of acute ischemia within both cerebral and cerebellar hemispheres, greatest in the left occipital lobe. Multiple vascular territories are affected. Pattern is most suggestive of watershed infarcts. IMPRESSION: Multiple bilateral areas of acute ischemia within both cerebral and cerebellar hemispheres, greatest in the left occipital lobe. Pattern is most suggestive of watershed infarcts. Electronically Signed   By: Ulyses Jarred M.D.   On: 10/01/2019 22:45    Scheduled Meds: . nicotine  21 mg Transdermal Daily   Continuous Infusions: . morphine      Assessment/Plan:  1. End-of-life care.  Patient using accessory muscles to breathe and hypotension at this time.  Patient made comfort care measures.  Will start morphine drip.  Advised patient's wife to call other family members. 2. Hypotension.  Patient receiving fluid bolus will hold off on blood transfusion since patient is comfort care measures at this point. 3. Bilateral large pulmonary emboli.  Not given anticoagulation with the stroke and hemoptysis.  Respiratory status worsening. 4. Acute stroke. 5. Acute metabolic encephalopathy 6. Stage IV pancreatic cancer 7. Hypokalemia on presentation 8. Acute kidney injury 9. Acute blood loss anemia with hemoglobin dropping down to 6.5 this morning 10. Elevated troponin likely demand ischemia from stroke,  acute blood loss anemia     Code Status:     Code Status Orders  (From admission, onward)         Start     Ordered   10-16-2019 0727  Do not attempt resuscitation (DNR)  Continuous       Question Answer Comment  In the event of cardiac or respiratory ARREST Do not call a "code blue"   In the event of cardiac or respiratory ARREST Do not perform Intubation, CPR, defibrillation or ACLS   In the event of cardiac or respiratory ARREST Use medication by any route, position, wound care, and other measures to relive pain and suffering. May use oxygen, suction and manual treatment of airway obstruction as needed for comfort.   Comments nurse may pronounce      10-16-19 0727        Code Status History    Date Active Date Inactive Code Status Order ID Comments User Context   09/05/2019 1758 2019/10/16 0727 DNR [id number]  Ivor Costa, MD ED   09/26/2019 1749 09/07/2019 1757 DNR [id number]  Ivor Costa, MD ED   06/08/2019 0849 06/09/2019 1629 Full Code [id number]  Karmen Bongo, MD Inpatient   04/26/2019 1650 04/29/2019 2030 Full Code [id number]  Ivor Costa, MD Inpatient   Advance Care Planning Activity    Advance Directive Documentation     Most Recent Value  Type of Advance Directive Out of facility DNR (pink MOST or yellow form)  Pre-existing out of facility DNR order (yellow form or pink MOST form) --  "MOST" Form in Place? --     Family Communication: Wife at the bedside Disposition Plan: Status is: Inpatient  Dispo: The patient is from: Home              Anticipated d/c is to: Comfort care measures here in the hospital              Anticipated d/c date is: Comfort care measures here in the hospital              Patient currently starting on comfort care measures here in the hospital  Time spent: 28 minutes  [place]

## 2019-10-04 DEATH — deceased

## 2019-10-10 ENCOUNTER — Ambulatory Visit (INDEPENDENT_AMBULATORY_CARE_PROVIDER_SITE_OTHER): Payer: BC Managed Care – PPO | Admitting: Vascular Surgery

## 2020-07-29 IMAGING — CT CT ABD-PELV W/ CM
2 of 5 series · 15 of 46 positions shown, 17 images · IV contrast (APPLIED)
Comparison: 07/30/2014

CLINICAL DATA: Pancreatic cancer, elevated bilirubin, generalized
weakness, chronic abdominal pain worse in past few days, prior RIGHT
nephrectomy 6-7 years ago due to renal cell carcinoma, history
hypertension, smoker, history alcohol abuse

EXAM:
CT ABDOMEN AND PELVIS WITH CONTRAST
TECHNIQUE: Multidetector CT imaging of the abdomen and pelvis was performed
using the standard protocol following bolus administration of
intravenous contrast. Sagittal and coronal MPR images reconstructed
from axial data set.
CONTRAST:  80mL OMNIPAQUE IOHEXOL 300 MG/ML SOLN IV. Dilute oral
contrast.

[Series 3: abdomen 5.0 · axial · 0.72mm/px · z∈[+810,+1220]mm · 12 of 96 slices shown, 14 images]
[im 7/96  soft-tissue]
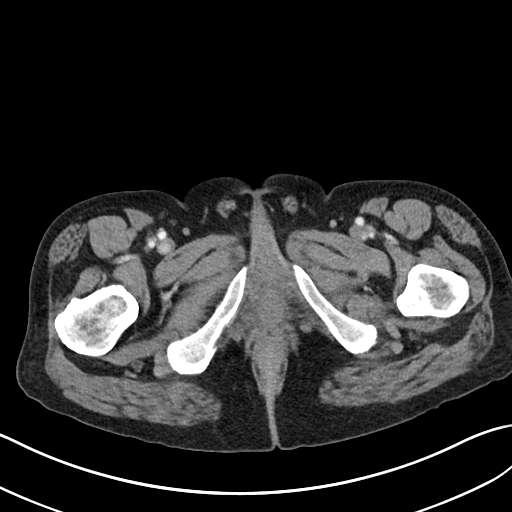
[im 7/96  bone]
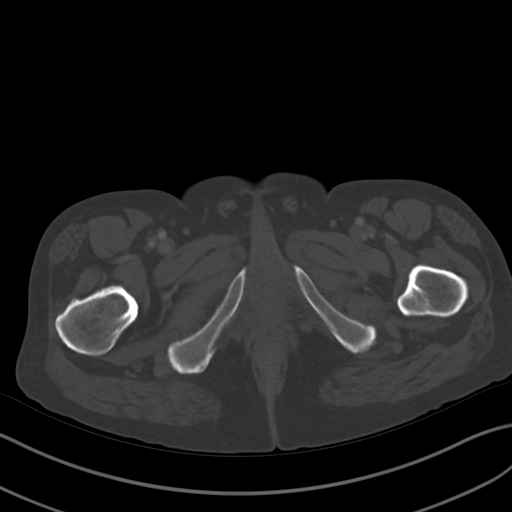
[im 13/96  soft-tissue]
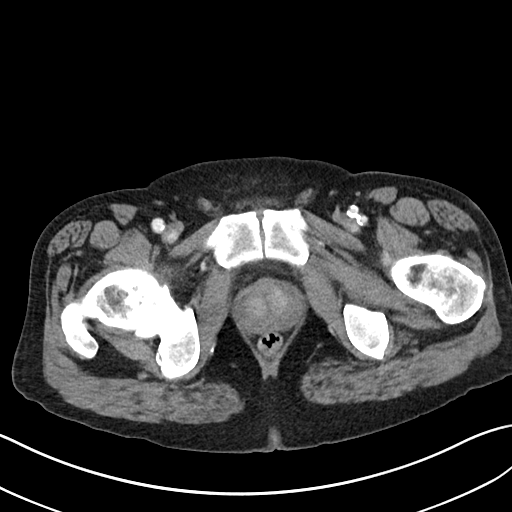
[im 20/96  soft-tissue]
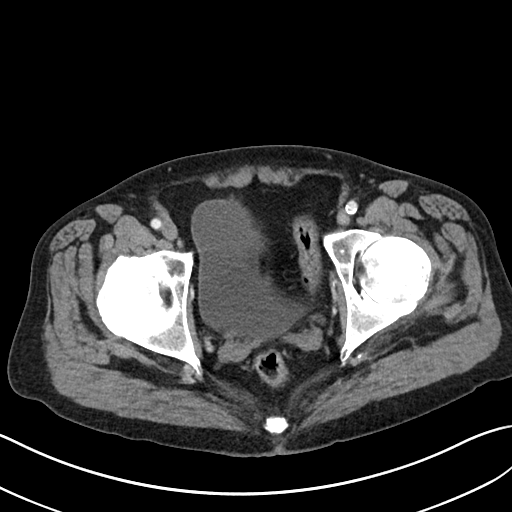
[im 32/96  soft-tissue]
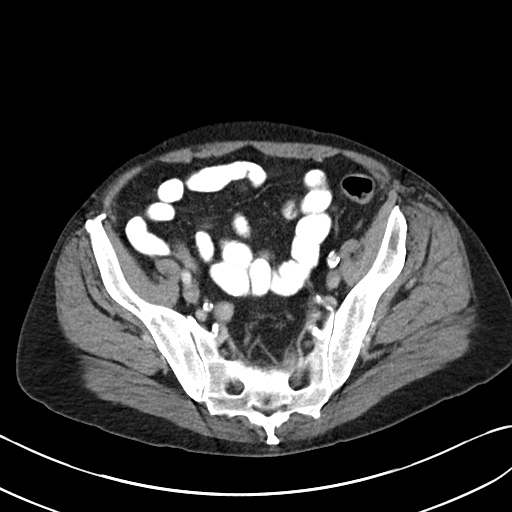
[im 39/96  soft-tissue]
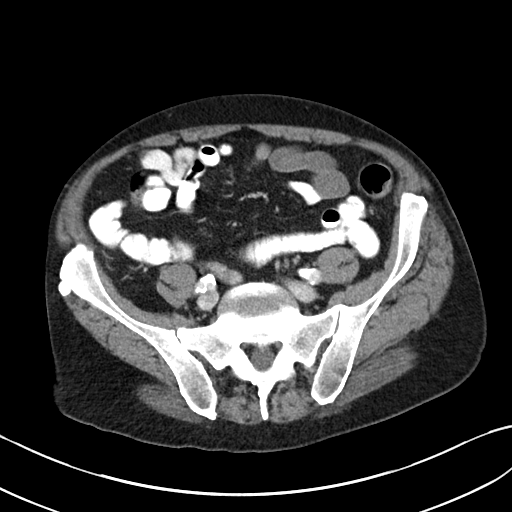
[im 45/96  soft-tissue]
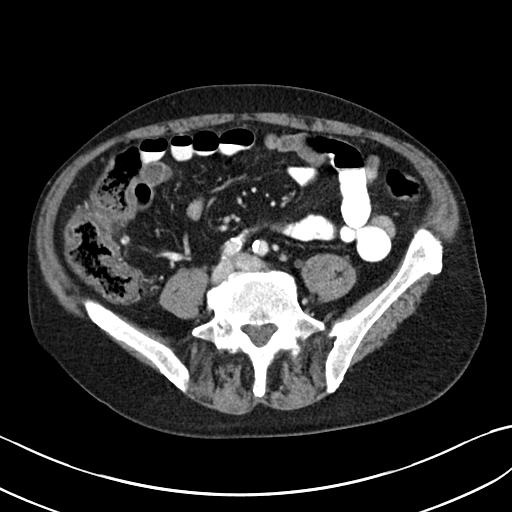
[im 51/96  soft-tissue]
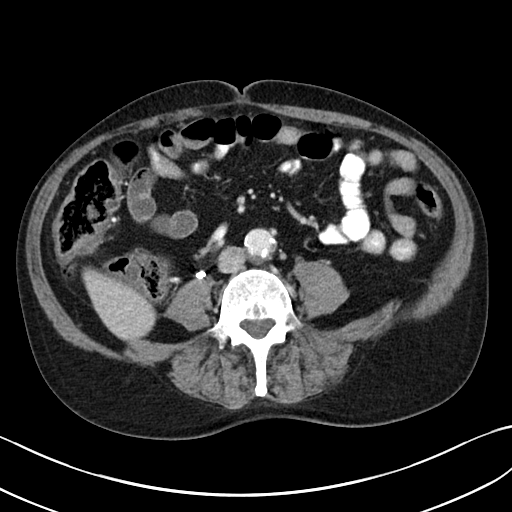
[im 58/96  soft-tissue]
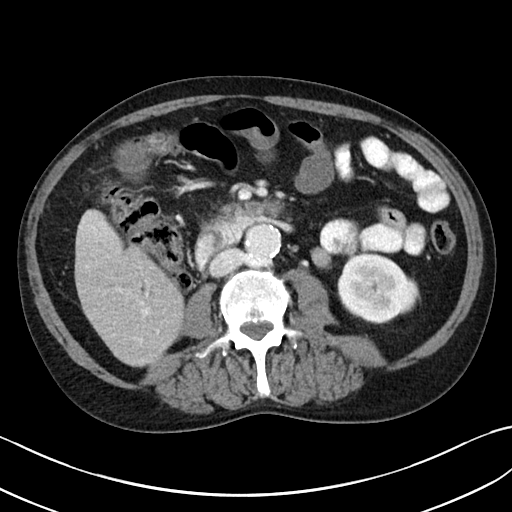
[im 64/96  soft-tissue]
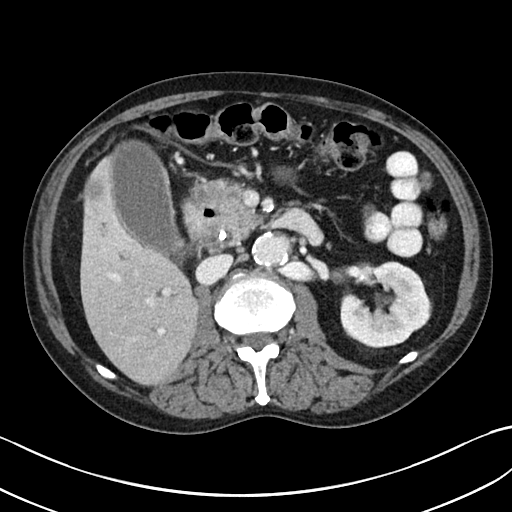
[im 64/96  bone]
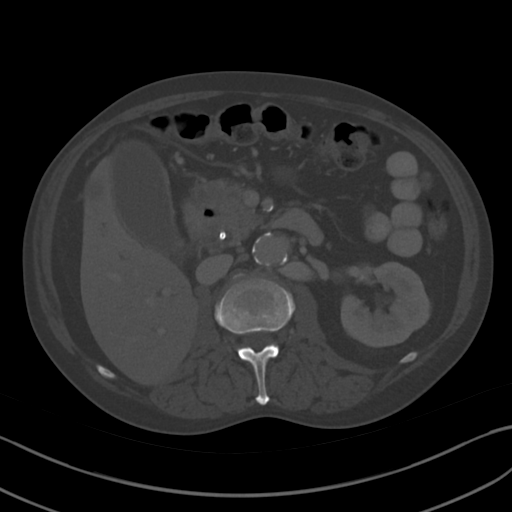
[im 77/96  soft-tissue]
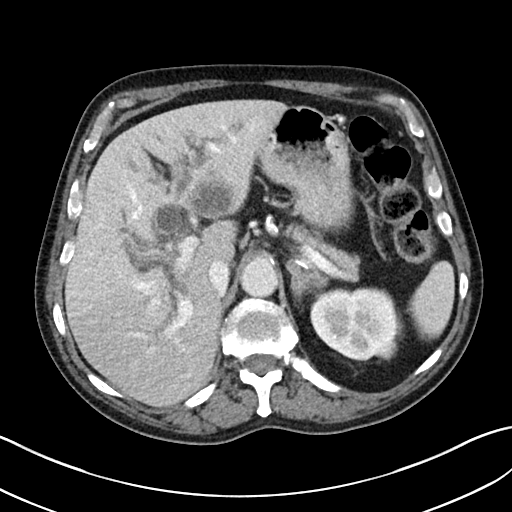
[im 83/96  soft-tissue]
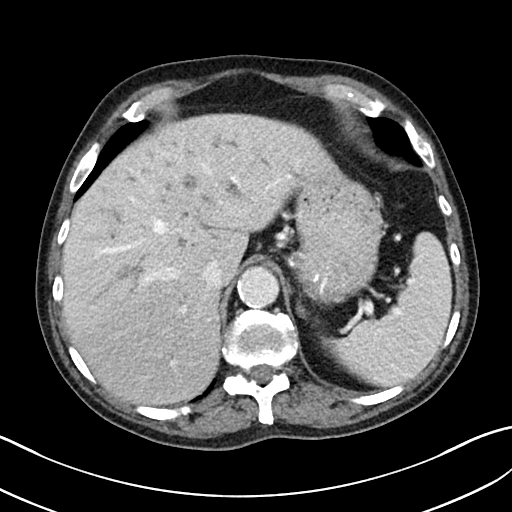
[im 89/96  soft-tissue]
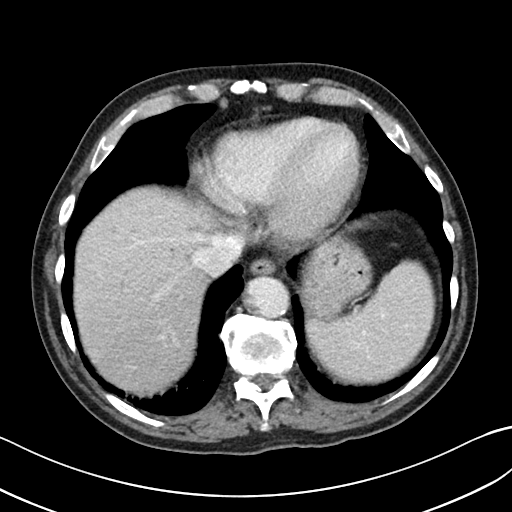

[Series 6: abdomen 3.0 mpr cor · coronal · 0.74mm/px · 3 of 98 slices shown]
[im 33/98  soft-tissue]
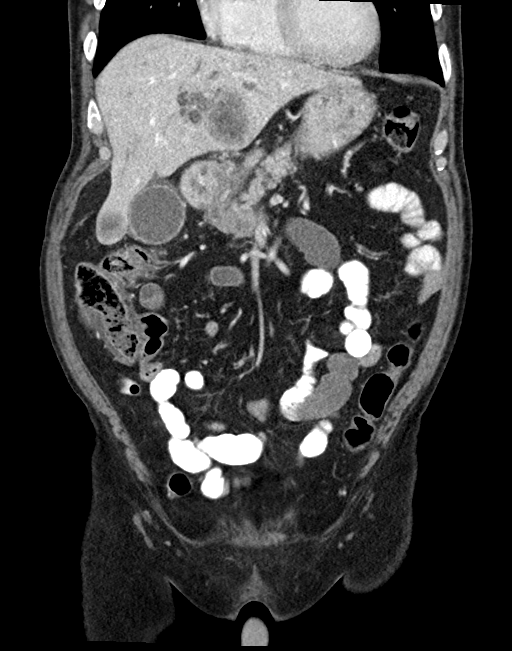
[im 44/98  soft-tissue]
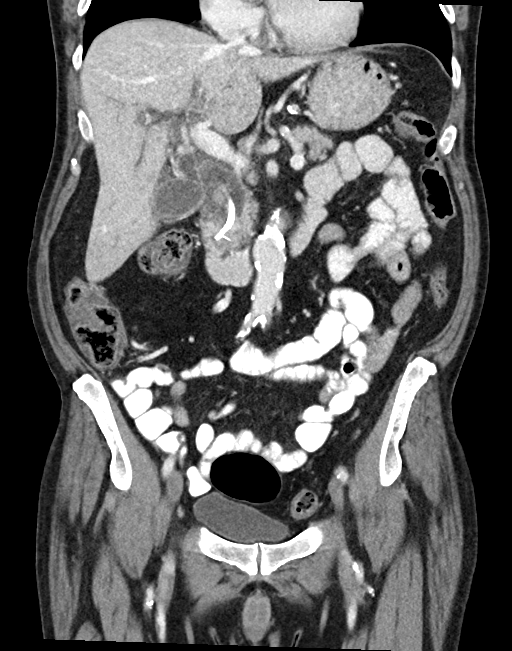
[im 54/98  soft-tissue]
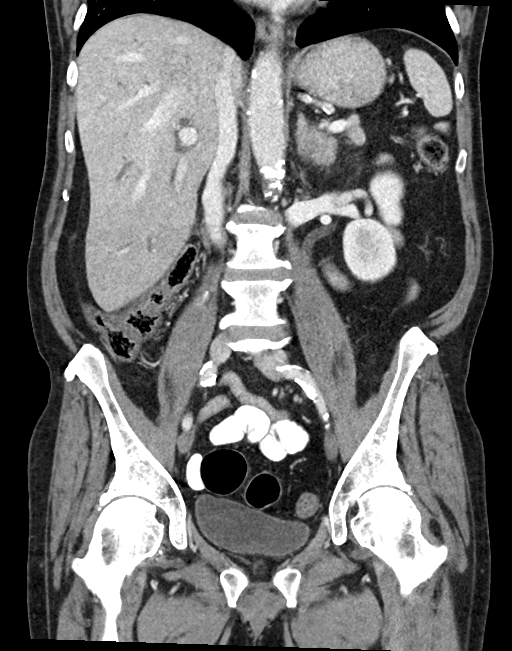

[15 of 46 positions shown; findings below may reference images not displayed]

FINDINGS: Lower chest: Minimal dependent bibasilar atelectasis

Hepatobiliary: CB stent identified, tip at ampulla and flange in
distal CBD. CBD dilatation present up to 20 mm diameter, new.
Intrahepatic biliary dilatation, new. Small calcified gallstone in
gallbladder. Minimal gallbladder wall thickening. Hepatic metastatic
lesions identified, lateral segment LEFT lobe 3.1 x 2.7 cm image 20
slightly increased. Additional lesion anterior RIGHT lobe more
inferiorly 2.5 x 1.9 cm image 35. Potential additional small lesion
more posteriorly RIGHT lobe 12 mm diameter image 23.

Pancreas: Enlargement of pancreatic head/body by tumor little
changed. Necrotic peripancreatic lymph node 12 mm short axis image
38, minimally increased.

Spleen: Normal appearance

Adrenals/Urinary Tract: Stable LEFT adrenal mass 23 x 15 mm.
Unremarkable LEFT kidney. Post RIGHT nephrectomy.

Stomach/Bowel: Colon under distended with suboptimal assessment of
wall thickness. Stomach and remaining bowel loops unremarkable.

Vascular/Lymphatic: Atherosclerotic calcifications aorta, proximal
visceral arteries, iliac arteries, coronary arteries. Aorta normal
caliber. Borderline enlarged portal caval lymph node 10 mm short
axis image 27.

Reproductive: Minimal prostatic enlargement. Seminal vesicles
unremarkable.

Other: No free air or free fluid.  No hernia.

Musculoskeletal: Osseous structures unremarkable.
IMPRESSION: New biliary dilatation despite CB stent, the tip of which appears to
be at the ampulla with the flange in the distal CBD, question
occluded versus slightly displaced stent.

Cholelithiasis.

Unchanged appearance of pancreatic head/body neoplasm.

Hepatic metastases, slightly increased in sizes since prior exam.

Stable LEFT adrenal mass.

Minimally increased size of necrotic peripancreatic lymph node.

Aortic Atherosclerosis ([id number].R).
